# Patient Record
Sex: Female | Born: 1955 | Race: White | Hispanic: No | Marital: Married | Smoking: Never smoker
Health system: Southern US, Community
[De-identification: ages and names within clinical notes are randomized; demographics above are authoritative.]

## PROBLEM LIST (undated history)

## (undated) DIAGNOSIS — Z973 Presence of spectacles and contact lenses: Secondary | ICD-10-CM

## (undated) DIAGNOSIS — T7840XA Allergy, unspecified, initial encounter: Secondary | ICD-10-CM

## (undated) DIAGNOSIS — M7750 Other enthesopathy of unspecified foot: Secondary | ICD-10-CM

## (undated) DIAGNOSIS — R7302 Impaired glucose tolerance (oral): Secondary | ICD-10-CM

## (undated) DIAGNOSIS — Z9889 Other specified postprocedural states: Secondary | ICD-10-CM

## (undated) DIAGNOSIS — R112 Nausea with vomiting, unspecified: Secondary | ICD-10-CM

## (undated) DIAGNOSIS — I517 Cardiomegaly: Secondary | ICD-10-CM

## (undated) DIAGNOSIS — K5900 Constipation, unspecified: Secondary | ICD-10-CM

## (undated) DIAGNOSIS — M76829 Posterior tibial tendinitis, unspecified leg: Secondary | ICD-10-CM

## (undated) DIAGNOSIS — R9431 Abnormal electrocardiogram [ECG] [EKG]: Secondary | ICD-10-CM

## (undated) DIAGNOSIS — M779 Enthesopathy, unspecified: Secondary | ICD-10-CM

## (undated) DIAGNOSIS — J309 Allergic rhinitis, unspecified: Secondary | ICD-10-CM

## (undated) DIAGNOSIS — J45909 Unspecified asthma, uncomplicated: Secondary | ICD-10-CM

## (undated) DIAGNOSIS — K219 Gastro-esophageal reflux disease without esophagitis: Secondary | ICD-10-CM

## (undated) DIAGNOSIS — K769 Liver disease, unspecified: Secondary | ICD-10-CM

## (undated) DIAGNOSIS — R7309 Other abnormal glucose: Secondary | ICD-10-CM

## (undated) DIAGNOSIS — Z923 Personal history of irradiation: Secondary | ICD-10-CM

## (undated) DIAGNOSIS — C801 Malignant (primary) neoplasm, unspecified: Secondary | ICD-10-CM

## (undated) DIAGNOSIS — L708 Other acne: Secondary | ICD-10-CM

## (undated) DIAGNOSIS — K222 Esophageal obstruction: Secondary | ICD-10-CM

## (undated) DIAGNOSIS — K625 Hemorrhage of anus and rectum: Secondary | ICD-10-CM

## (undated) DIAGNOSIS — M216X9 Other acquired deformities of unspecified foot: Secondary | ICD-10-CM

## (undated) DIAGNOSIS — I10 Essential (primary) hypertension: Secondary | ICD-10-CM

## (undated) HISTORY — DX: Abnormal electrocardiogram (ECG) (EKG): R94.31

## (undated) HISTORY — DX: Other abnormal glucose: R73.09

## (undated) HISTORY — DX: Hemorrhage of anus and rectum: K62.5

## (undated) HISTORY — PX: COLONOSCOPY: SHX174

## (undated) HISTORY — DX: Allergy, unspecified, initial encounter: T78.40XA

## (undated) HISTORY — DX: Malignant (primary) neoplasm, unspecified: C80.1

## (undated) HISTORY — DX: Essential (primary) hypertension: I10

## (undated) HISTORY — DX: Liver disease, unspecified: K76.9

## (undated) HISTORY — DX: Posterior tibial tendinitis, unspecified leg: M76.829

## (undated) HISTORY — DX: Allergic rhinitis, unspecified: J30.9

## (undated) HISTORY — PX: UPPER GASTROINTESTINAL ENDOSCOPY: SHX188

## (undated) HISTORY — DX: Other specified postprocedural states: Z98.890

## (undated) HISTORY — PX: OVARIAN CYST REMOVAL: SHX89

## (undated) HISTORY — DX: Esophageal obstruction: K22.2

## (undated) HISTORY — PX: BREAST LUMPECTOMY: SHX2

## (undated) HISTORY — PX: DILATION AND CURETTAGE OF UTERUS: SHX78

## (undated) HISTORY — PX: NASAL SINUS SURGERY: SHX719

## (undated) HISTORY — PX: BREAST SURGERY: SHX581

## (undated) HISTORY — DX: Gastro-esophageal reflux disease without esophagitis: K21.9

## (undated) HISTORY — PX: KNEE ARTHROSCOPY: SUR90

## (undated) HISTORY — DX: Unspecified asthma, uncomplicated: J45.909

## (undated) HISTORY — DX: Impaired glucose tolerance (oral): R73.02

## (undated) HISTORY — DX: Other acquired deformities of unspecified foot: M21.6X9

## (undated) HISTORY — PX: COLONOSCOPY W/ POLYPECTOMY: SHX1380

## (undated) HISTORY — DX: Other acne: L70.8

## (undated) HISTORY — DX: Other enthesopathy of unspecified foot and ankle: M77.50

## (undated) HISTORY — DX: Cardiomegaly: I51.7

## (undated) HISTORY — DX: Constipation, unspecified: K59.00

---

## 1983-09-15 HISTORY — PX: CHOLECYSTECTOMY: SHX55

## 1998-04-01 ENCOUNTER — Encounter: Payer: Self-pay | Admitting: Gastroenterology

## 1998-04-01 HISTORY — PX: ESOPHAGOGASTRODUODENOSCOPY: SHX1529

## 1998-10-15 HISTORY — PX: ARTHROSCOPIC REPAIR ACL: SUR80

## 1998-12-26 ENCOUNTER — Other Ambulatory Visit: Admission: RE | Admit: 1998-12-26 | Discharge: 1998-12-26 | Payer: Self-pay | Admitting: Gynecology

## 2000-02-02 ENCOUNTER — Other Ambulatory Visit: Admission: RE | Admit: 2000-02-02 | Discharge: 2000-02-02 | Payer: Self-pay | Admitting: Gynecology

## 2000-07-24 ENCOUNTER — Encounter: Payer: Self-pay | Admitting: *Deleted

## 2000-07-24 ENCOUNTER — Emergency Department (HOSPITAL_COMMUNITY): Admission: EM | Admit: 2000-07-24 | Discharge: 2000-07-24 | Payer: Self-pay | Admitting: *Deleted

## 2000-09-14 HISTORY — PX: ABDOMINAL HYSTERECTOMY: SHX81

## 2001-02-03 ENCOUNTER — Other Ambulatory Visit: Admission: RE | Admit: 2001-02-03 | Discharge: 2001-02-03 | Payer: Self-pay | Admitting: Gynecology

## 2001-02-21 ENCOUNTER — Inpatient Hospital Stay (HOSPITAL_COMMUNITY): Admission: RE | Admit: 2001-02-21 | Discharge: 2001-02-23 | Payer: Self-pay | Admitting: Gynecology

## 2001-02-21 ENCOUNTER — Encounter (INDEPENDENT_AMBULATORY_CARE_PROVIDER_SITE_OTHER): Payer: Self-pay | Admitting: Specialist

## 2003-12-31 ENCOUNTER — Emergency Department (HOSPITAL_COMMUNITY): Admission: EM | Admit: 2003-12-31 | Discharge: 2003-12-31 | Payer: Self-pay | Admitting: Emergency Medicine

## 2004-09-29 ENCOUNTER — Ambulatory Visit: Payer: Self-pay | Admitting: Endocrinology

## 2004-10-30 ENCOUNTER — Ambulatory Visit: Payer: Self-pay | Admitting: Internal Medicine

## 2005-02-21 ENCOUNTER — Emergency Department (HOSPITAL_COMMUNITY): Admission: EM | Admit: 2005-02-21 | Discharge: 2005-02-21 | Payer: Self-pay | Admitting: *Deleted

## 2005-02-21 ENCOUNTER — Emergency Department (HOSPITAL_COMMUNITY): Admission: EM | Admit: 2005-02-21 | Discharge: 2005-02-21 | Payer: Self-pay | Admitting: Emergency Medicine

## 2005-03-30 ENCOUNTER — Ambulatory Visit: Payer: Self-pay | Admitting: Endocrinology

## 2005-06-18 ENCOUNTER — Other Ambulatory Visit: Admission: RE | Admit: 2005-06-18 | Discharge: 2005-06-18 | Payer: Self-pay | Admitting: Gynecology

## 2006-02-15 ENCOUNTER — Ambulatory Visit: Payer: Self-pay | Admitting: Internal Medicine

## 2006-03-11 ENCOUNTER — Ambulatory Visit: Payer: Self-pay | Admitting: Endocrinology

## 2006-04-01 ENCOUNTER — Ambulatory Visit: Payer: Self-pay | Admitting: Endocrinology

## 2006-04-12 ENCOUNTER — Ambulatory Visit: Payer: Self-pay | Admitting: Endocrinology

## 2006-04-20 ENCOUNTER — Ambulatory Visit: Payer: Self-pay | Admitting: Endocrinology

## 2006-08-07 ENCOUNTER — Ambulatory Visit: Payer: Self-pay | Admitting: Family Medicine

## 2006-11-02 ENCOUNTER — Ambulatory Visit: Payer: Self-pay | Admitting: Internal Medicine

## 2006-12-01 ENCOUNTER — Encounter (HOSPITAL_COMMUNITY): Admission: RE | Admit: 2006-12-01 | Discharge: 2006-12-31 | Payer: Self-pay | Admitting: Orthopedic Surgery

## 2007-04-11 ENCOUNTER — Encounter: Payer: Self-pay | Admitting: Endocrinology

## 2007-11-08 ENCOUNTER — Ambulatory Visit: Payer: Self-pay | Admitting: Sports Medicine

## 2007-11-08 DIAGNOSIS — M216X9 Other acquired deformities of unspecified foot: Secondary | ICD-10-CM | POA: Insufficient documentation

## 2007-11-08 DIAGNOSIS — M7750 Other enthesopathy of unspecified foot: Secondary | ICD-10-CM

## 2007-11-08 DIAGNOSIS — M79609 Pain in unspecified limb: Secondary | ICD-10-CM

## 2007-11-08 HISTORY — DX: Other enthesopathy of unspecified foot and ankle: M77.50

## 2007-11-08 HISTORY — DX: Other acquired deformities of unspecified foot: M21.6X9

## 2007-11-23 ENCOUNTER — Ambulatory Visit: Payer: Self-pay | Admitting: Sports Medicine

## 2007-12-06 ENCOUNTER — Ambulatory Visit: Payer: Self-pay | Admitting: Sports Medicine

## 2007-12-06 ENCOUNTER — Encounter: Payer: Self-pay | Admitting: Family Medicine

## 2007-12-08 ENCOUNTER — Telehealth: Payer: Self-pay | Admitting: Sports Medicine

## 2007-12-22 ENCOUNTER — Encounter: Payer: Self-pay | Admitting: Endocrinology

## 2008-01-04 ENCOUNTER — Ambulatory Visit: Payer: Self-pay | Admitting: Endocrinology

## 2008-01-04 DIAGNOSIS — K625 Hemorrhage of anus and rectum: Secondary | ICD-10-CM

## 2008-01-04 HISTORY — DX: Hemorrhage of anus and rectum: K62.5

## 2008-01-07 LAB — CONVERTED CEMR LAB
ALT: 38 units/L — ABNORMAL HIGH (ref 0–35)
AST: 35 units/L (ref 0–37)
Albumin: 4.1 g/dL (ref 3.5–5.2)
Alkaline Phosphatase: 95 units/L (ref 39–117)
BUN: 8 mg/dL (ref 6–23)
Basophils Absolute: 0.1 10*3/uL (ref 0.0–0.1)
Basophils Relative: 1.3 % — ABNORMAL HIGH (ref 0.0–1.0)
Bilirubin Urine: NEGATIVE
Bilirubin, Direct: 0.1 mg/dL (ref 0.0–0.3)
CO2: 29 meq/L (ref 19–32)
Calcium: 9.5 mg/dL (ref 8.4–10.5)
Chloride: 101 meq/L (ref 96–112)
Cholesterol: 182 mg/dL (ref 0–200)
Creatinine, Ser: 0.5 mg/dL (ref 0.4–1.2)
Crystals: NEGATIVE
Direct LDL: 102.7 mg/dL
Eosinophils Absolute: 0.1 10*3/uL (ref 0.0–0.7)
Eosinophils Relative: 0.9 % (ref 0.0–5.0)
GFR calc Af Amer: 167 mL/min
GFR calc non Af Amer: 138 mL/min
Glucose, Bld: 119 mg/dL — ABNORMAL HIGH (ref 70–99)
HCT: 39.6 % (ref 36.0–46.0)
HDL: 37.2 mg/dL — ABNORMAL LOW (ref >39.0)
Hemoglobin, Urine: NEGATIVE
Hemoglobin: 13.5 g/dL (ref 12.0–15.0)
Ketones, ur: NEGATIVE mg/dL
Leukocytes, UA: NEGATIVE
Lymphocytes Relative: 40.6 % (ref 12.0–46.0)
MCHC: 34.1 g/dL (ref 30.0–36.0)
MCV: 88.6 fL (ref 78.0–100.0)
Monocytes Absolute: 0.6 10*3/uL (ref 0.1–1.0)
Monocytes Relative: 5.8 % (ref 3.0–12.0)
Mucus, UA: NEGATIVE
Neutro Abs: 5 10*3/uL (ref 1.4–7.7)
Neutrophils Relative %: 51.4 % (ref 43.0–77.0)
Nitrite: NEGATIVE
Platelets: 291 10*3/uL (ref 150–400)
Potassium: 3.8 meq/L (ref 3.5–5.1)
RBC / HPF: NONE SEEN
RBC: 4.47 M/uL (ref 3.87–5.11)
RDW: 12.4 % (ref 11.5–14.6)
Sodium: 139 meq/L (ref 135–145)
Specific Gravity, Urine: 1.015 (ref 1.000–1.03)
TSH: 1.51 microintl units/mL (ref 0.35–5.50)
Total Bilirubin: 0.9 mg/dL (ref 0.3–1.2)
Total CHOL/HDL Ratio: 4.9
Total Protein, Urine: NEGATIVE mg/dL
Total Protein: 7.4 g/dL (ref 6.0–8.3)
Triglycerides: 275 mg/dL (ref 0–149)
Urine Glucose: NEGATIVE mg/dL
Urobilinogen, UA: 0.2 (ref 0.0–1.0)
VLDL: 55 mg/dL — ABNORMAL HIGH (ref 0–40)
WBC: 9.7 10*3/uL (ref 4.5–10.5)
pH: 5.5 (ref 5.0–8.0)

## 2008-01-19 ENCOUNTER — Ambulatory Visit: Payer: Self-pay | Admitting: Endocrinology

## 2008-01-19 DIAGNOSIS — K769 Liver disease, unspecified: Secondary | ICD-10-CM | POA: Insufficient documentation

## 2008-01-19 DIAGNOSIS — R739 Hyperglycemia, unspecified: Secondary | ICD-10-CM | POA: Insufficient documentation

## 2008-01-19 DIAGNOSIS — E7800 Pure hypercholesterolemia, unspecified: Secondary | ICD-10-CM

## 2008-01-19 HISTORY — DX: Liver disease, unspecified: K76.9

## 2008-01-25 ENCOUNTER — Ambulatory Visit: Payer: Self-pay | Admitting: Internal Medicine

## 2008-01-25 DIAGNOSIS — J45909 Unspecified asthma, uncomplicated: Secondary | ICD-10-CM

## 2008-01-25 DIAGNOSIS — K219 Gastro-esophageal reflux disease without esophagitis: Secondary | ICD-10-CM

## 2008-01-25 DIAGNOSIS — J309 Allergic rhinitis, unspecified: Secondary | ICD-10-CM

## 2008-01-25 HISTORY — DX: Unspecified asthma, uncomplicated: J45.909

## 2008-01-25 HISTORY — DX: Allergic rhinitis, unspecified: J30.9

## 2008-01-25 HISTORY — DX: Gastro-esophageal reflux disease without esophagitis: K21.9

## 2008-02-17 DIAGNOSIS — K222 Esophageal obstruction: Secondary | ICD-10-CM

## 2008-02-17 DIAGNOSIS — L708 Other acne: Secondary | ICD-10-CM

## 2008-02-17 HISTORY — DX: Esophageal obstruction: K22.2

## 2008-02-17 HISTORY — DX: Other acne: L70.8

## 2008-02-21 ENCOUNTER — Ambulatory Visit: Payer: Self-pay | Admitting: Gastroenterology

## 2008-02-21 DIAGNOSIS — K5900 Constipation, unspecified: Secondary | ICD-10-CM

## 2008-02-21 HISTORY — DX: Constipation, unspecified: K59.00

## 2008-03-05 ENCOUNTER — Encounter: Payer: Self-pay | Admitting: Gastroenterology

## 2008-03-05 ENCOUNTER — Ambulatory Visit: Payer: Self-pay | Admitting: Gastroenterology

## 2008-03-05 LAB — HM COLONOSCOPY

## 2008-03-08 ENCOUNTER — Encounter: Payer: Self-pay | Admitting: Gastroenterology

## 2008-08-30 ENCOUNTER — Encounter: Payer: Self-pay | Admitting: Endocrinology

## 2008-09-05 ENCOUNTER — Ambulatory Visit: Payer: Self-pay | Admitting: Internal Medicine

## 2008-09-05 DIAGNOSIS — H01009 Unspecified blepharitis unspecified eye, unspecified eyelid: Secondary | ICD-10-CM | POA: Insufficient documentation

## 2008-10-22 ENCOUNTER — Encounter: Payer: Self-pay | Admitting: Endocrinology

## 2009-07-08 ENCOUNTER — Encounter: Payer: Self-pay | Admitting: Endocrinology

## 2009-09-14 HISTORY — PX: US ECHOCARDIOGRAPHY: HXRAD669

## 2009-09-14 HISTORY — PX: CARDIOVASCULAR STRESS TEST: SHX262

## 2010-01-23 ENCOUNTER — Encounter: Payer: Self-pay | Admitting: Endocrinology

## 2010-03-08 LAB — CONVERTED CEMR LAB: Pap Smear: NORMAL

## 2010-04-07 ENCOUNTER — Encounter: Payer: Self-pay | Admitting: Sports Medicine

## 2010-05-13 ENCOUNTER — Ambulatory Visit: Payer: Self-pay | Admitting: Sports Medicine

## 2010-05-13 DIAGNOSIS — S93409A Sprain of unspecified ligament of unspecified ankle, initial encounter: Secondary | ICD-10-CM | POA: Insufficient documentation

## 2010-05-13 DIAGNOSIS — M76829 Posterior tibial tendinitis, unspecified leg: Secondary | ICD-10-CM

## 2010-05-13 HISTORY — DX: Posterior tibial tendinitis, unspecified leg: M76.829

## 2010-08-05 ENCOUNTER — Encounter: Payer: Self-pay | Admitting: Endocrinology

## 2010-08-05 ENCOUNTER — Ambulatory Visit: Payer: Self-pay | Admitting: Cardiology

## 2010-08-05 ENCOUNTER — Ambulatory Visit: Payer: Self-pay | Admitting: Endocrinology

## 2010-08-05 ENCOUNTER — Ambulatory Visit (HOSPITAL_COMMUNITY): Admission: RE | Admit: 2010-08-05 | Discharge: 2010-08-05 | Payer: Self-pay | Admitting: Endocrinology

## 2010-08-05 ENCOUNTER — Ambulatory Visit: Payer: Self-pay

## 2010-08-05 ENCOUNTER — Encounter: Payer: Self-pay | Admitting: Cardiology

## 2010-08-05 DIAGNOSIS — R42 Dizziness and giddiness: Secondary | ICD-10-CM

## 2010-08-05 DIAGNOSIS — R9431 Abnormal electrocardiogram [ECG] [EKG]: Secondary | ICD-10-CM

## 2010-08-05 DIAGNOSIS — N390 Urinary tract infection, site not specified: Secondary | ICD-10-CM | POA: Insufficient documentation

## 2010-08-05 DIAGNOSIS — I10 Essential (primary) hypertension: Secondary | ICD-10-CM

## 2010-08-05 HISTORY — DX: Essential (primary) hypertension: I10

## 2010-08-05 HISTORY — DX: Abnormal electrocardiogram (ECG) (EKG): R94.31

## 2010-08-06 ENCOUNTER — Encounter: Payer: Self-pay | Admitting: Endocrinology

## 2010-08-06 ENCOUNTER — Telehealth: Payer: Self-pay | Admitting: Endocrinology

## 2010-08-15 ENCOUNTER — Encounter: Payer: Self-pay | Admitting: Endocrinology

## 2010-08-18 ENCOUNTER — Encounter (HOSPITAL_COMMUNITY): Admission: RE | Admit: 2010-08-18 | Payer: Self-pay | Admitting: Endocrinology

## 2010-08-18 ENCOUNTER — Encounter: Payer: Self-pay | Admitting: Cardiology

## 2010-08-18 ENCOUNTER — Telehealth: Payer: Self-pay | Admitting: Endocrinology

## 2010-09-01 ENCOUNTER — Ambulatory Visit: Payer: Self-pay | Admitting: Endocrinology

## 2010-09-03 ENCOUNTER — Telehealth (INDEPENDENT_AMBULATORY_CARE_PROVIDER_SITE_OTHER): Payer: Self-pay | Admitting: Radiology

## 2010-09-04 ENCOUNTER — Encounter (HOSPITAL_COMMUNITY)
Admission: RE | Admit: 2010-09-04 | Discharge: 2010-10-14 | Payer: Self-pay | Source: Home / Self Care | Attending: Endocrinology | Admitting: Endocrinology

## 2010-09-04 ENCOUNTER — Encounter: Payer: Self-pay | Admitting: Cardiology

## 2010-09-04 ENCOUNTER — Ambulatory Visit: Payer: Self-pay

## 2010-09-09 ENCOUNTER — Encounter: Payer: Self-pay | Admitting: Endocrinology

## 2010-09-23 ENCOUNTER — Ambulatory Visit
Admission: RE | Admit: 2010-09-23 | Discharge: 2010-09-23 | Payer: Self-pay | Source: Home / Self Care | Attending: Endocrinology | Admitting: Endocrinology

## 2010-09-23 DIAGNOSIS — I517 Cardiomegaly: Secondary | ICD-10-CM | POA: Insufficient documentation

## 2010-09-23 HISTORY — DX: Cardiomegaly: I51.7

## 2010-10-12 LAB — CONVERTED CEMR LAB
ALT: 24 units/L (ref 0–35)
ALT: 27 units/L (ref 0–35)
AST: 23 units/L (ref 0–37)
AST: 25 units/L (ref 0–37)
Albumin: 4 g/dL (ref 3.5–5.2)
Albumin: 4.6 g/dL (ref 3.5–5.2)
Alkaline Phosphatase: 111 units/L (ref 39–117)
Alkaline Phosphatase: 94 units/L (ref 39–117)
BUN: 13 mg/dL (ref 6–23)
Basophils Absolute: 0 10*3/uL (ref 0.0–0.1)
Basophils Relative: 0.4 % (ref 0.0–3.0)
Bilirubin Urine: NEGATIVE
Bilirubin, Direct: 0.1 mg/dL (ref 0.0–0.3)
Bilirubin, Direct: 0.1 mg/dL (ref 0.0–0.3)
CO2: 30 meq/L (ref 19–32)
Calcium: 9.5 mg/dL (ref 8.4–10.5)
Chloride: 105 meq/L (ref 96–112)
Cholesterol: 198 mg/dL (ref 0–200)
Creatinine, Ser: 0.5 mg/dL (ref 0.4–1.2)
Eosinophils Absolute: 0 10*3/uL (ref 0.0–0.7)
Eosinophils Relative: 0.1 % (ref 0.0–5.0)
GFR calc non Af Amer: 140.02 mL/min (ref >60)
Glucose, Bld: 114 mg/dL — ABNORMAL HIGH (ref 70–99)
HCT: 42.3 % (ref 36.0–46.0)
HDL: 45.7 mg/dL (ref >39.00)
Hemoglobin, Urine: NEGATIVE
Hemoglobin: 14.7 g/dL (ref 12.0–15.0)
Hgb A1c MFr Bld: 5.5 % (ref 4.6–6.0)
Hgb A1c MFr Bld: 5.8 % (ref 4.6–6.5)
Ketones, ur: NEGATIVE mg/dL
LDL Cholesterol: 115 mg/dL — ABNORMAL HIGH (ref 0–99)
Leukocytes, UA: NEGATIVE
Lymphocytes Relative: 34.4 % (ref 12.0–46.0)
Lymphs Abs: 2.8 10*3/uL (ref 0.7–4.0)
MCHC: 34.7 g/dL (ref 30.0–36.0)
MCV: 89.4 fL (ref 78.0–100.0)
Monocytes Absolute: 0.3 10*3/uL (ref 0.1–1.0)
Monocytes Relative: 3.9 % (ref 3.0–12.0)
Neutro Abs: 5.1 10*3/uL (ref 1.4–7.7)
Neutrophils Relative %: 61.2 % (ref 43.0–77.0)
Nitrite: NEGATIVE
Platelets: 318 10*3/uL (ref 150.0–400.0)
Potassium: 4.2 meq/L (ref 3.5–5.1)
RBC: 4.73 M/uL (ref 3.87–5.11)
RDW: 13.5 % (ref 11.5–14.6)
Sodium: 142 meq/L (ref 135–145)
Specific Gravity, Urine: 1.02 (ref 1.000–1.030)
TSH: 1.58 microintl units/mL (ref 0.35–5.50)
Total Bilirubin: 0.5 mg/dL (ref 0.3–1.2)
Total Bilirubin: 0.6 mg/dL (ref 0.3–1.2)
Total CHOL/HDL Ratio: 4
Total Protein, Urine: NEGATIVE mg/dL
Total Protein: 7.3 g/dL (ref 6.0–8.3)
Total Protein: 7.8 g/dL (ref 6.0–8.3)
Triglycerides: 185 mg/dL — ABNORMAL HIGH (ref 0.0–149.0)
Urine Glucose: NEGATIVE mg/dL
Urobilinogen, UA: 0.2 (ref 0.0–1.0)
VLDL: 37 mg/dL (ref 0.0–40.0)
WBC: 8.3 10*3/uL (ref 4.5–10.5)
pH: 7 (ref 5.0–8.0)

## 2010-10-14 NOTE — Letter (Signed)
Summary: [facility]  [facility]   Imported By: Sherian Rein 02/19/2010 12:05:12  _____________________________________________________________________  External Attachment:    Type:   Image     Comment:   External Document

## 2010-10-14 NOTE — Consult Note (Signed)
Summary: [facility]  [facility]   Imported By: Marily Memos 05/09/2010 11:03:41  _____________________________________________________________________  External Attachment:    Type:   Image     Comment:   External Document

## 2010-10-14 NOTE — Letter (Signed)
Summary: Out of Work  [facility]  [address] [place], [place] [address]   Phone: [phone]  Fax: [phone]    August 05, 2010   Employee:  Laura [name]    To Whom It May Concern:   For Medical reasons, please excuse the above named employee from work for the following dates:  Start:   08/05/10  End:   08/11/10     Sincerely,    Minus Breeding MD

## 2010-10-14 NOTE — Assessment & Plan Note (Signed)
Summary: dizzy/sinus inf?cd   Vital Signs:  Patient profile:   55 year old female Height:      67 inches (170.18 cm) Weight:      169.75 pounds (77.16 kg) BMI:     26.68 O2 Sat:      98 % on Room air Temp:     98.0 degrees F (36.67 degrees C) oral Pulse rate:   82 / minute BP sitting:   162 / 92  (left arm) Cuff size:   regular  Vitals Entered By: Brenton Grills CMA Duncan Dull) (August 05, 2010 10:34 AM)  O2 Flow:  Room air CC: dizziness since last night, Elevate BP/blood work/aj Is Patient Diabetic? No Comments pt is no longer taking Promethazine-codeine cough syrup, Eyrthromycin, or Cephalexin   Primary Shaylea Ucci:  Nelwyn Salisbury ,MD  CC:  dizziness since last night and Elevate BP/blood work/aj.  History of Present Illness: pt states 1 day of slight congestion in the nose, and associated non-vertigenous-quality dizziness.  pt says she last had vertigo a few years ago.    Current Medications (verified): 1)  Multivitamins   Tabs (Multiple Vitamin) .... Take 1 By Mouth Qd 2)  Caltrate 600+d 600-400 Mg-Unit  Tabs (Calcium Carbonate-Vitamin D) .... Take 2 By Mouth Qd 3)  Prevacid 30 Mg  Cpdr (Lansoprazole) .... Take 1 By Mouth Qd 4)  Advair Diskus 100-50 Mcg/dose  Misc (Fluticasone-Salmeterol) .... Tale 1 Puff Once Daily Prn 5)  Promethazine-Codeine 6.25-10 Mg/48ml  Syrp (Promethazine-Codeine) .Marland Kitchen.. 1 Tsp By Mouth Qid Prn 6)  Aspirin 81 Mg  Tbec (Aspirin) .... Once Daily 7)  Vitamin E   Crea (Vitamin E) .... Once Daily 8)  Miralax   Powd (Polyethylene Glycol 3350) .... Take One Scoop At Bedtime 9)  Erythromycin 5 Mg/gm Oint (Erythromycin) .... Use Asd Four Times Per Day For 10 Days 10)  Cephalexin 500 Mg Caps (Cephalexin) .Marland Kitchen.. 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         denies earache  Physical Exam  General:  normal appearance.   Head:  head: no deformity eyes: no periorbital swelling, no proptosis external nose and ears are normal mouth: no lesion seen Ears:  both tm's are congested.  the right tm also has slight erythema Neck:  Supple without thyroid enlargement or tenderness.  Lungs:  Clear to auscultation bilaterally. Normal respiratory effort.  Heart:  Regular rate and rhythm without murmurs or gallops noted. Normal S1,S2.   Msk:  muscle bulk and strength are grossly normal.  no obvious joint swelling.  gait is normal and steady    Impression & Recommendations:  Problem # 1:  DIZZINESS (ICD-780.4) uncertain etiology  Problem # 2:  uri new  Problem # 3:  ABNORMAL ELECTROCARDIOGRAM (ICD-794.31) Assessment: New  new since 2009 i have discussed with cardiology  Orders: Echo Referral (Echo) Echocardiogram (Echo) Nuclear Stress Test (Nuc Stress Test) Cardiolite (Cardiolite)  Medications Added to Medication List This Visit: 1)  Meclizine Hcl 12.5 Mg Tabs (Meclizine hcl) .Marland Kitchen.. 1-2 tabs three times a day  as needed for dizziness 2)  Azithromycin 500 Mg Tabs (Azithromycin) .Marland Kitchen.. 1 tab once daily  Other Orders: EKG w/ Interpretation (93000) TLB-BMP (Basic Metabolic Panel-BMET) (80048-METABOL) TLB-Lipid Panel (80061-LIPID) TLB-CBC Platelet - w/Differential (85025-CBCD) TLB-Hepatic/Liver Function Pnl (80076-HEPATIC) TLB-TSH (Thyroid Stimulating Hormone) (84443-TSH) TLB-A1C / Hgb A1C  (Glycohemoglobin) (83036-A1C) TLB-Udip w/ Micro (81001-URINE) Est. Patient Level IV (78295)  Patient Instructions: 1)  blood tests are being ordered for you today.  please call [phone] to hear your test results. 2)  pending the test results, please take: 3)  loratadine-d (non-prescription) as needed for congestion 4)  azithromycin 500 mg once daily 5)  meclizine three times a day as needed for dizziness 6)  check "echo," and treadmill "myoview."  both are special but easy heart tests, to see if you have a heart problem that would explaiin your abnormal cardiogram and/or your symptoms.  you will be called with a day and time for an appointment. 7)  you should take it easy until you feel better. Prescriptions: AZITHROMYCIN 500 MG TABS (AZITHROMYCIN) 1 tab once daily  #6 x 0   Entered and Authorized by:   Minus Breeding MD   Signed by:   Minus Breeding MD on 08/05/2010   Method used:   Electronically to        [facility] (retail)       [address]       [place], [place]  [address]       Ph: [phone]       Fax: [phone]   RxID:   [id number] MECLIZINE HCL 12.5 MG TABS (MECLIZINE HCL) 1-2 tabs three times a day as needed for dizziness  #30 x 1   Entered and Authorized by:   Minus Breeding MD   Signed by:   Minus Breeding MD on 08/05/2010   Method used:   Electronically to        [facility] (retail)       [address]       [place], [place]  [address]       Ph: [phone]       Fax: [phone]   RxID:   [id number]    Orders Added: 1)  EKG w/ Interpretation [93000] 2)  TLB-BMP (Basic Metabolic Panel-BMET) [80048-METABOL] 3)  TLB-Lipid Panel [80061-LIPID] 4)  TLB-CBC Platelet - w/Differential [85025-CBCD] 5)  TLB-Hepatic/Liver Function Pnl [80076-HEPATIC] 6)  TLB-TSH (Thyroid Stimulating Hormone) [84443-TSH] 7)  TLB-A1C / Hgb A1C (Glycohemoglobin) [83036-A1C] 8)  TLB-Udip w/ Micro  [81001-URINE] 9)  Est. Patient Level IV [70623] 10)  Echo Referral [Echo] 11)  Echocardiogram [Echo] 12)  Nuclear Stress Test [Nuc Stress Test] 13)  Cardiolite [Cardiolite]

## 2010-10-14 NOTE — Miscellaneous (Signed)
Summary: Appointment Canceled  Appointment status changed to canceled by [employer] on 08/18/2010 10:38 AM.  Cancellation Comments --------------------- crs/ wt: 169/ dx: abn ekg. dr. Everardo All. [email or web address] precret gd  Appointment Information ----------------------- Appt Type:  CARDIOLOGY NUCLEAR TESTING      Date:  Monday, August 18, 2010      Time:  9:45 AM for 15 min   Urgency:  Routine   Made By:  Hoy Finlay Scheduler  To Visit:  [id number]    Reason:  crs/ wt: 169/ dx: abn ekg. dr. Everardo All. [email or web address] precret gd  Appt Comments ------------- -- 08/18/10 10:38: ([facility]) CANCELED -- crs/ wt: 169/ dx: abn ekg. dr. Everardo All. [email or web address] precret gd -- 08/05/10 11:57: ([facility]) BOOKED -- Routine CARDIOLOGY NUCLEAR TESTING at 08/18/2010 9:45 AM for 15 min crs/ wt: 169/ dx: abn ekg. dr. Everardo All. [email or web address] pre

## 2010-10-14 NOTE — Progress Notes (Signed)
Summary: Echo results  Phone Note Call from Laura [name] Call back at Home Phone [phone]   Caller: Laura [name] [phone] cell Summary of Call: Pt called requesting results of ECHO. Pt advised that results not reviewed by SAE but I will request results on his return Monday 11/28. Pt okay to wait. Initial call taken by: Margaret Pyle, CMA,  August 06, 2010 2:25 PM  Follow-up for Phone Call        i left message on phone tree  Follow-up by: Minus Breeding MD,  August 10, 2010 5:21 PM  Additional Follow-up for Phone Call Additional follow up Details #1::        left message on machine for pt to return my call. Margaret Pyle, CMA  August 12, 2010 11:03 AM  Pt advised via Harriett Sine to check PT message Additional Follow-up by: Margaret Pyle, CMA,  August 12, 2010 4:08 PM    New/Updated Medications: LOSARTAN POTASSIUM 25 MG TABS (LOSARTAN POTASSIUM) 1 tab once daily CIPROFLOXACIN HCL 500 MG TABS (CIPROFLOXACIN HCL) 1 tab two times a day Prescriptions: CIPROFLOXACIN HCL 500 MG TABS (CIPROFLOXACIN HCL) 1 tab two times a day  #14 x 0   Entered and Authorized by:   Minus Breeding MD   Signed by:   Minus Breeding MD on 08/10/2010   Method used:   Electronically to        [facility] (retail)       [address]       [place], [place]  [address]       Ph: [phone]       Fax: [phone]   RxID:   [id number] LOSARTAN POTASSIUM 25 MG TABS (LOSARTAN POTASSIUM) 1 tab once daily  #30 x 11   Entered and Authorized by:   Minus Breeding MD   Signed by:   Minus Breeding MD on 08/10/2010   Method used:   Electronically to        [facility] (retail)       [address]       [place], [place]  [address]       Ph: [phone]       Fax: [phone]   RxID:   [id number]

## 2010-10-14 NOTE — Progress Notes (Signed)
Summary: Missed OV  Phone Note Call from Patient   Caller: Patient [phone] Summary of Call: Pt called very upset stating she missed her appt for stress test today because "someone" wrote down her appt as 12/03 instead of 12/05. Pt says she when Saturday and they were closed but she was called this morning but Cardiology to find out if she was still coming for her appt. Pt said she has to cancel appt and re-schedule to 12/22 because she cannot take anymore time off of work. Pt was adamant that SAE be informed that she missed her appt "because your office messes up". Is pt okay to have stress test done 12/22? Initial call taken by: Margaret Pyle, CMA,  August 18, 2010 4:19 PM  Follow-up for Phone Call        yes, it is ok Follow-up by: Minus Breeding MD,  August 18, 2010 5:20 PM  Additional Follow-up for Phone Call Additional follow up Details #1::        left message for pt to callback office Additional Follow-up by: Brenton Grills CMA Duncan Dull),  August 19, 2010 9:35 AM    Additional Follow-up for Phone Call Additional follow up Details #2::    pt informed Follow-up by: Brenton Grills CMA Duncan Dull),  August 19, 2010 10:11 AM

## 2010-10-14 NOTE — Assessment & Plan Note (Signed)
Summary: ORTHOTICS PER [facility]   Vital Signs:  Patient profile:   55 year old female Height:      67 inches Weight:      165 pounds BMI:     25.94 BP sitting:   159 / 98  Vitals Entered By: Lillia Pauls CMA (May 13, 2010 3:02 PM)  Primary Provider:  Nelwyn Salisbury ,MD   History of Present Illness: 55yo female to office referred by Dr. Thurston Hole for orthotics. Pt sustained L ankle sprain 03/08/10, she has been doing physical therapy & using ASO brace, but started to develop increasing medial ankle pain radiating down to foot.  During f/u eval 04/28/10 pt noted to have posterior tibialis tendonitis & mild collapse of transverse arch. She has intermittant swelling over the medial aspect of her ankle. She still feels weak in the ankle, but is improving. Pt is on her feet for long periods of time for her job.  Allergies: No Known Drug Allergies  Physical Exam  General:  Well-developed,well-nourished,in no acute distress; alert,appropriate and cooperative throughout examination Msk:  ANKLES:  - L Ankle: mild swelling noted posterior to medial malleoli, no ecchymosis or erythema.  No warmth. Full ROM. Strength +4/5 eversion, plantar flexion, inversion; +5/5 dorsiflexion Stable lateral & medial ligaments. Talar dome nontender; No sign of peroneal tendon subluxations; (+)TTP along posterior tibialis with tenderness over insertion at navicular.  FEET: decreased calcaneal varus on left with b/l heel raise.  Mild collapse of longitudinal arch of left foot.  Right foot with stable arch.  GAIT: walks with b/l pronation, noted to have some wobble of left foot/ankle.     Impression & Recommendations:  Problem # 1:  FOOT PAIN, LEFT (ICD-729.5) - L foot pain secondary to posterior tibialis tendonitis related to ankle sprain. - Pt would benefit from custom orthotics to stabilize arch & provide more support. - Patient was fitted for a : standard, cushioned, semi-rigid orthotic. The  orthotic was heated and afterward the patient stood on the orthotic blank positioned on the orthotic stand. The patient was positioned in subtalar neutral position and 10 degrees of ankle dorsiflexion in a weight bearing stance. After completion of molding, a stable base was applied to the orthotic blank. The blank was ground to a stable position for weight bearing. Size: 8 Base: blue EVA foam Posting: none Additional orthotic padding: none Evaluation of gait with orthotics reveals more neutral position.  Pt feels these are comfortable. - Contact office with any questions/concerns regarding orthotics.  Orders: [employer], each unit [phone]) Foot Orthosis ( Arch Strap/Heel Cup) [phone])  Problem # 2:  TIBIALIS TENDINITIS (ICD-726.72) - Left posterior tibialis tendinitis - Fitted with custom orthotics - Provided with arch straps for additional support/comfort - Reviewed home exercises.  Problem # 3:  ANKLE SPRAIN, LEFT (ICD-845.00) - Cont. ASO & PT as prescribed. - f/u with Dr. Thurston Hole as directed.  Her updated medication list for this problem includes:    Aspirin 81 Mg Tbec (Aspirin) ..... Once daily  Complete Medication List: 1)  Multivitamins Tabs (Multiple vitamin) .... Take 1 by mouth qd 2)  Caltrate 600+d 600-400 Mg-unit Tabs (Calcium carbonate-vitamin d) .... Take 2 by mouth qd 3)  Prevacid 30 Mg Cpdr (Lansoprazole) .... Take 1 by mouth qd 4)  Advair Diskus 100-50 Mcg/dose Misc (Fluticasone-salmeterol) .... Tale 1 puff once daily prn 5)  Promethazine-codeine 6.25-10 Mg/31ml Syrp (Promethazine-codeine) .Marland Kitchen.. 1 tsp by mouth qid prn 6)  Aspirin 81 Mg Tbec (Aspirin) .... Once daily 7)  Vitamin E Crea (Vitamin e) .... Once daily 8)  Miralax Powd (Polyethylene glycol 3350) .... Take one scoop at bedtime 9)  Erythromycin 5 Mg/gm Oint (Erythromycin) .... Use asd four times per day for 10 days 10)  Cephalexin 500 Mg Caps (Cephalexin) .Marland Kitchen.. 1po three times a day for 10 days

## 2010-10-16 NOTE — Progress Notes (Signed)
Summary: nuc pre-procedure  Phone Note Outgoing Call   Call placed by: Domenic Polite, CNMT,  September 03, 2010 3:57 PM Call placed to: Patient Reason for Call: Confirm/change Appt Summary of Call: Reviewed information on Myoview Information Sheet (see scanned document for further details).  Spoke with patient.      Nuclear Med Background Indications for Stress Test: Evaluation for Ischemia, Abnormal EKG   History: Asthma, Echo  History Comments: 08/05/10 Echo mild LVH     Nuclear Pre-Procedure Cardiac Risk Factors: Lipids Height (in): 67

## 2010-10-16 NOTE — Assessment & Plan Note (Signed)
Summary: Cardiology Nuclear Testing  Nuclear Med Background Indications for Stress Test: Evaluation for Ischemia, Abnormal EKG   History: Asthma, Echo  History Comments: 08/05/10 Echo:mild LVH   Symptoms Comments: No cardiac complaints   Nuclear Pre-Procedure Cardiac Risk Factors: Hypertension, Lipids Caffeine/Decaff Intake: None NPO After: 7:30 PM Lungs: Clear.  O2 Sat 99% on RA. IV 0.9% NS with Angio Cath: 22g     IV Site: R Antecubital IV Started by: Bonnita Levan, RN Chest Size (in) 38     Cup Size D     Height (in): 67 Weight (lb): 175 BMI: 27.51 Tech Comments: No medications taken this a.m.  Nuclear Med Study 1 or 2 day study:  1 day     Stress Test Type:  Treadmill/Lexiscan Reading MD:  Olga Millers, MD     Referring MD:  Romero Belling, MD Resting Radionuclide:  Technetium 48m Tetrofosmin     Resting Radionuclide Dose:  11 mCi  Stress Radionuclide:  Technetium 69m Tetrofosmin     Stress Radionuclide Dose:  33 mCi   Stress Protocol Exercise Time (min):  2:00 min     Max HR:  144 bpm     Predicted Max HR:  167 bpm  Max Systolic BP: 179 mm Hg     Percent Max HR:  86.23 %Rate Pressure Product:  16109  Lexiscan: 0.4 mg   Stress Test Technologist:  Rea College, CMA-N     Nuclear Technologist:  Doyne Keel, CNMT  Rest Procedure  Myocardial perfusion imaging was performed at rest 45 minutes following the intravenous administration of Technetium 21m Tetrofosmin.  Stress Procedure  The patient received IV Lexiscan 0.4 mg over 15-seconds with concurrent low level exercise and then Technetium 83m Tetrofosmin was injected at 30-seconds.  There were no significant changes with Lexiscan.  Quantitative spect images were obtained after a 45 minute delay.  QPS Raw Data Images:  There is no interference from other nuclear activity. Stress Images:  There is decreased uptake in the distal anterior wall and apex. Rest Images:  There is decreased uptake in the distal anterior wall  and apex. Subtraction (SDS):  No evidence of ischemia. Transient Ischemic Dilatation:  .94  (Normal <1.22)  Lung/Heart Ratio:  .29  (Normal <0.45)  Quantitative Gated Spect Images QGS EDV:  85 ml QGS ESV:  28 ml QGS EF:  67 % QGS cine images:  Normal wall motion.   Overall Impression  Exercise Capacity: Lexiscan with no exercise. BP Response: Normal blood pressure response. Clinical Symptoms: No chest pain ECG Impression: Insignificant upsloping ST segment depression. Overall Impression: Normal lexiscan nuclear study with soft tissue attenuation but no ischemia.  Appended Document: Cardiology Nuclear Testing please leave message on phone tree--normal  Appended Document: Cardiology Nuclear Testing left message on phone tree/AJ

## 2010-10-16 NOTE — Assessment & Plan Note (Signed)
Summary: FU---STC   Vital Signs:  Patient profile:   55 year old female Height:      67 inches (170.18 cm) Weight:      177.25 pounds (80.57 kg) BMI:     27.86 O2 Sat:      98 % on Room air Temp:     98.3 degrees F (36.83 degrees C) oral Pulse rate:   77 / minute BP sitting:   124 / 74  (left arm) Cuff size:   regular  Vitals Entered By: Brenton Grills CMA Duncan Dull) (September 23, 2010 4:31 PM)  O2 Flow:  Room air CC: Follow-up visit/aj Is Patient Diabetic? No   Primary Provider:  Nelwyn Salisbury ,MD  CC:  Follow-up visit/aj.  History of Present Illness: pt says she takes losartan as rx'ed.  dizziness is much better.    Current Medications (verified): 1)  Multivitamins   Tabs (Multiple Vitamin) .... Take 1 By Mouth Qd 2)  Caltrate 600+d 600-400 Mg-Unit  Tabs (Calcium Carbonate-Vitamin D) .... Take 2 By Mouth Qd 3)  Prevacid 30 Mg  Cpdr (Lansoprazole) .... Take 1 By Mouth Qd 4)  Advair Diskus 100-50 Mcg/dose  Misc (Fluticasone-Salmeterol) .... Tale 1 Puff Once Daily Prn 5)  Aspirin 81 Mg  Tbec (Aspirin) .... Once Daily 6)  Vitamin E   Crea (Vitamin E) .... Once Daily 7)  Miralax   Powd (Polyethylene Glycol 3350) .... Take One Scoop At Bedtime 8)  Meclizine Hcl 12.5 Mg Tabs (Meclizine Hcl) .Marland Kitchen.. 1-2 Tabs Three Times A Day As Needed For Dizziness 9)  Azithromycin 500 Mg Tabs (Azithromycin) .Marland Kitchen.. 1 Tab Once Daily 10)  Losartan Potassium 25 Mg Tabs (Losartan Potassium) .Marland Kitchen.. 1 Tab Once Daily 11)  Ciprofloxacin Hcl 500 Mg Tabs (Ciprofloxacin Hcl) .Marland Kitchen.. 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    Physical Exam  General:  normal appearance.   Lungs:  Clear to auscultation bilaterally. Normal respiratory effort.  Heart:  Regular rate and rhythm without murmurs or gallops noted. Normal S1,S2.     Impression & Recommendations:  Problem # 1:  HYPERTENSION (ICD-401.9) well-controlled  Other Orders: Est. Patient Level III (04540)  Patient Instructions: 1)  continue losartan. 2)  Please schedule a follow-up appointment in 1 year.   Orders Added: 1)  Est. Patient Level III [98119]     Preventive Care Screening  Mammogram:    Date:  03/08/2010    Results:  normal   Pap Smear:    Date:  03/08/2010    Results:  normal      gyn is dr Greta Doom, who does dexa and mammography

## 2010-10-16 NOTE — Letter (Signed)
Summary: [facility]  [facility]   Imported By: Lester [place] 10/01/2010 07:52:50  _____________________________________________________________________  External Attachment:    Type:   Image     Comment:   External Document

## 2010-10-16 NOTE — Letter (Signed)
Summary: [facility]  [facility]   Imported By: Lester [place] 09/24/2010 12:23:08  _____________________________________________________________________  External Attachment:    Type:   Image     Comment:   External Document

## 2010-11-06 ENCOUNTER — Ambulatory Visit (INDEPENDENT_AMBULATORY_CARE_PROVIDER_SITE_OTHER): Payer: BC Managed Care – PPO | Admitting: Endocrinology

## 2010-11-06 ENCOUNTER — Encounter: Payer: Self-pay | Admitting: Endocrinology

## 2010-11-06 DIAGNOSIS — J069 Acute upper respiratory infection, unspecified: Secondary | ICD-10-CM

## 2010-11-11 NOTE — Assessment & Plan Note (Signed)
Summary: sinus infection /nws   Vital Signs:  Patient profile:   55 year old female Height:      67 inches (170.18 cm) Weight:      181.25 pounds (82.39 kg) BMI:     28.49 O2 Sat:      98 % on Room air Temp:     98.1 degrees F (36.72 degrees C) oral Pulse rate:   75 / minute Pulse rhythm:   regular BP sitting:   122 / 78  (left arm) Cuff size:   regular  Vitals Entered By: Brenton Grills CMA Duncan Dull) (November 06, 2010 4:22 PM)  O2 Flow:  Room air CC: Sinus infection (greenish yellow mucus) x 4 days/aj Is Patient Diabetic? No   Primary Provider:  Nelwyn Salisbury ,MD  CC:  Sinus infection (greenish yellow mucus) x 4 days/aj.  History of Present Illness: 3 days of nasal congestion, earache, purulent nasal drainage, and slight cough.    Current Medications (verified): 1)  Multivitamins   Tabs (Multiple Vitamin) .... Take 1 By Mouth Qd 2)  Caltrate 600+d 600-400 Mg-Unit  Tabs (Calcium Carbonate-Vitamin D) .... Take 2 By Mouth Qd 3)  Prevacid 30 Mg  Cpdr (Lansoprazole) .... Take 1 By Mouth Qd 4)  Advair Diskus 100-50 Mcg/dose  Misc (Fluticasone-Salmeterol) .... Tale 1 Puff Once Daily Prn 5)  Aspirin 81 Mg  Tbec (Aspirin) .... Once Daily 6)  Vitamin E   Crea (Vitamin E) .... Once Daily 7)  Miralax   Powd (Polyethylene Glycol 3350) .... Take One Scoop At Bedtime 8)  Losartan Potassium 25 Mg Tabs (Losartan Potassium) .Marland Kitchen.. 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    Physical Exam  General:  normal appearance.   Head:  head: no deformity eyes: no periorbital swelling, no proptosis external nose and ears are normal mouth: no lesion seen Lungs:  Clear to auscultation bilaterally. Normal respiratory effort.    Impression & Recommendations:  Problem # 1:  URI (ICD-465.9) Assessment New  Medications Added to Medication List This Visit: 1)  Promethazine-codeine 6.25-10 Mg/52ml Syrp (Promethazine-codeine) .Marland Kitchen.. 1 teaspoon every 4 hrs as needed for cough 2)  Azithromycin 500 Mg Tabs (Azithromycin) .Marland Kitchen.. 1 tab once daily  Other Orders: Est. Patient Level III (16109)  Patient Instructions: 1)  take azithromycin 500 mg once daily 2)  take loratadine-d (non-prescription) as needed for congestion 3)  here is a prescription for cough syrup.   Prescriptions: AZITHROMYCIN 500 MG TABS (AZITHROMYCIN) 1 tab once daily  #6 x 0   Entered and Authorized by:   Minus Breeding MD   Signed by:   Minus Breeding MD on 11/06/2010   Method used:   Print then Give to Patient   RxID:   [id number] PROMETHAZINE-CODEINE 6.25-10 MG/5ML SYRP (PROMETHAZINE-CODEINE) 1 teaspoon every 4 hrs as needed for cough  #8 oz x 0   Entered and Authorized by:   Minus Breeding MD   Signed by:   Minus Breeding MD on 11/06/2010   Method used:  Print then Give to Patient   RxID:   [id number]    Orders Added: 1)  Est. Patient Level III [14782]

## 2010-12-23 ENCOUNTER — Emergency Department (HOSPITAL_COMMUNITY)
Admission: EM | Admit: 2010-12-23 | Discharge: 2010-12-23 | Disposition: A | Discharge status: Home / Self Care | Payer: BC Managed Care – PPO | Attending: Emergency Medicine | Admitting: Emergency Medicine

## 2010-12-23 ENCOUNTER — Emergency Department (HOSPITAL_COMMUNITY): Payer: BC Managed Care – PPO

## 2010-12-23 DIAGNOSIS — S2220XA Unspecified fracture of sternum, initial encounter for closed fracture: Secondary | ICD-10-CM | POA: Insufficient documentation

## 2010-12-23 DIAGNOSIS — S20219A Contusion of unspecified front wall of thorax, initial encounter: Secondary | ICD-10-CM | POA: Insufficient documentation

## 2010-12-23 DIAGNOSIS — I10 Essential (primary) hypertension: Secondary | ICD-10-CM | POA: Insufficient documentation

## 2010-12-23 DIAGNOSIS — Z79899 Other long term (current) drug therapy: Secondary | ICD-10-CM | POA: Insufficient documentation

## 2010-12-23 DIAGNOSIS — R079 Chest pain, unspecified: Secondary | ICD-10-CM | POA: Insufficient documentation

## 2010-12-23 LAB — DIFFERENTIAL
Lymphocytes Relative: 20 % (ref 12–46)
Lymphs Abs: 3.2 10*3/uL (ref 0.7–4.0)
Neutrophils Relative %: 75 % (ref 43–77)

## 2010-12-23 LAB — BASIC METABOLIC PANEL
BUN: 14 mg/dL (ref 6–23)
Chloride: 103 mEq/L (ref 96–112)
Glucose, Bld: 125 mg/dL — ABNORMAL HIGH (ref 70–99)
Potassium: 4 mEq/L (ref 3.5–5.1)

## 2010-12-23 LAB — CBC
HCT: 42.1 % (ref 36.0–46.0)
Hemoglobin: 14 g/dL (ref 12.0–15.0)
MCV: 90.3 fL (ref 78.0–100.0)
RBC: 4.66 MIL/uL (ref 3.87–5.11)
WBC: 16.2 10*3/uL — ABNORMAL HIGH (ref 4.0–10.5)

## 2010-12-23 LAB — POCT CARDIAC MARKERS: Troponin i, poc: <0.05 ng/mL (ref 0.00–0.09)

## 2010-12-23 MED ORDER — IOHEXOL 300 MG/ML  SOLN
80.0000 mL | Freq: Once | INTRAMUSCULAR | Status: AC | PRN
  Administered 2010-12-23: 80 mL via INTRAVENOUS

## 2010-12-26 ENCOUNTER — Encounter: Payer: Self-pay | Admitting: Internal Medicine

## 2010-12-26 DIAGNOSIS — Z0000 Encounter for general adult medical examination without abnormal findings: Secondary | ICD-10-CM | POA: Insufficient documentation

## 2010-12-29 ENCOUNTER — Encounter: Payer: Self-pay | Admitting: Internal Medicine

## 2010-12-29 ENCOUNTER — Ambulatory Visit (INDEPENDENT_AMBULATORY_CARE_PROVIDER_SITE_OTHER): Payer: BC Managed Care – PPO | Admitting: Internal Medicine

## 2010-12-29 VITALS — BP 130/70 | HR 85 | Temp 98.2°F | Resp 14 | Ht 67.0 in | Wt 180.2 lb

## 2010-12-29 DIAGNOSIS — J45909 Unspecified asthma, uncomplicated: Secondary | ICD-10-CM

## 2010-12-29 DIAGNOSIS — S2220XA Unspecified fracture of sternum, initial encounter for closed fracture: Secondary | ICD-10-CM

## 2010-12-29 DIAGNOSIS — R7302 Impaired glucose tolerance (oral): Secondary | ICD-10-CM

## 2010-12-29 DIAGNOSIS — I10 Essential (primary) hypertension: Secondary | ICD-10-CM

## 2010-12-29 DIAGNOSIS — R7309 Other abnormal glucose: Secondary | ICD-10-CM

## 2010-12-29 MED ORDER — HYDROCODONE-ACETAMINOPHEN 5-500 MG PO TABS: 1.0000 | ORAL_TABLET | Freq: Four times a day (QID) | ORAL | Status: AC | PRN

## 2010-12-29 NOTE — Assessment & Plan Note (Signed)
With ongoing currenltly mod to severe pain, for pain control (vicodin), forms filled out;  Should f/u with Dr Herbert Pun to assess ability to return to work

## 2010-12-29 NOTE — Patient Instructions (Signed)
Your "inital form" was filled out today Take all new medications as prescribed - the vicodin Continue all other medications as before You should be called when your FMLA is done Please plan to return to see Dr Everardo All June 8, to assess ability to go back to work
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
## 2010-12-30 DIAGNOSIS — Z0279 Encounter for issue of other medical certificate: Secondary | ICD-10-CM

## 2011-01-02 ENCOUNTER — Encounter: Payer: Self-pay | Admitting: Endocrinology

## 2011-01-02 ENCOUNTER — Ambulatory Visit (INDEPENDENT_AMBULATORY_CARE_PROVIDER_SITE_OTHER): Payer: BC Managed Care – PPO | Admitting: Endocrinology

## 2011-01-02 ENCOUNTER — Telehealth: Payer: Self-pay

## 2011-01-02 VITALS — BP 114/74 | HR 86 | Temp 98.3°F | Ht 67.0 in | Wt 182.4 lb

## 2011-01-02 DIAGNOSIS — J069 Acute upper respiratory infection, unspecified: Secondary | ICD-10-CM

## 2011-01-02 MED ORDER — MORPHINE SULFATE 15 MG PO TB12: 15.0000 mg | ORAL_TABLET | Freq: Two times a day (BID) | ORAL | Status: DC

## 2011-01-02 MED ORDER — CEFUROXIME AXETIL 250 MG PO TABS: 250.0000 mg | ORAL_TABLET | Freq: Two times a day (BID) | ORAL | Status: DC

## 2011-01-02 NOTE — Telephone Encounter (Signed)
Ov this afternoon (add-on ok).  The ov earlier this week was for a different reason.

## 2011-01-02 NOTE — Patient Instructions (Signed)
Here are prescriptions for extended-release pain medication, and an antibiotic. You should still take the vicodin as needed for pain.   Take loratadine-d (non-prescription) as needed for congestion, and an antihistamine eye drop as needed for eye symptoms.  Return here next week if you are not better.
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
## 2011-01-02 NOTE — Telephone Encounter (Signed)
Pt advised, added on for 3p today

## 2011-01-02 NOTE — Telephone Encounter (Signed)
Pt called stating she developed ST, ear pain, cough and chest congestion. Pt was seen in clinic by Dr Jonny Ruiz Monday 04/16 for an acute appt and is requesting Rx for ABX, please advise.

## 2011-01-11 ENCOUNTER — Encounter: Payer: Self-pay | Admitting: Internal Medicine

## 2011-01-11 DIAGNOSIS — R7302 Impaired glucose tolerance (oral): Secondary | ICD-10-CM

## 2011-01-11 HISTORY — DX: Impaired glucose tolerance (oral): R73.02

## 2011-01-11 NOTE — Assessment & Plan Note (Signed)
stable overall by hx and exam, most recent lab reviewed with pt, and pt to continue medical treatment as before  BP Readings from Last 3 Encounters:  01/02/11 114/74  12/29/10 130/70  11/06/10 122/78

## 2011-01-11 NOTE — Assessment & Plan Note (Signed)
stable overall by hx and exam, most recent lab reviewed with pt, and pt to continue medical treatment as before  Lab Results  Component Value Date   WBC 16.2* 12/23/2010   HGB 14.0 12/23/2010   HCT 42.1 12/23/2010   PLT 290 12/23/2010   CHOL 198 08/05/2010   TRIG 185.0* 08/05/2010   HDL 45.70 08/05/2010   LDLDIRECT 102.7 01/04/2008   ALT 24 08/05/2010   AST 25 08/05/2010   NA 138 12/23/2010   K 4.0 12/23/2010   CL 103 12/23/2010   CREATININE 0.59 12/23/2010   BUN 14 12/23/2010   CO2 25 12/23/2010   TSH 1.58 08/05/2010   HGBA1C 5.8 08/05/2010

## 2011-01-11 NOTE — Assessment & Plan Note (Signed)
stable overall by hx and exam, most recent lab reviewed with pt, and pt to continue medical treatment as before  Lab Results  Component Value Date   HGBA1C 5.8 08/05/2010

## 2011-01-30 NOTE — Discharge Summary (Signed)
[facility]  Patient:    [name], Laura                     MRN: [id number] Adm. Date:  [id number] Disc. Date: [id number] Attending:  Susa Raring                           Discharge Summary  HISTORY:                      This patient is a 55 year old female admitted to the hospital for complaints of pelvic pain, dyspareunia, dysmenorrhea that has been unresponsive to conservative measures.  She has a known history of pelvic and abdominal adhesions seen on laparoscopy about five years previously.  Pain continues.  She also has pelvic pain and pressure, dyspareunia because of uterine prolapse.  She is now admitted for definitive treatment.  PHYSICAL EXAMINATION  PELVIC:                       External genitalia is normal.  Cervix is smooth and prolapsed.  Uterus is normal size and prolapsed with the cervix.  Bladder and rectum are fairly well supported.  Uterus is tender to movement and palpation.  There is tenderness in the left adnexal area.  LABORATORIES:                 Admission hemogram was normal including a hemoglobin of 14, postoperative hemogram showed a hemoglobin of 11.3, hematocrit 32, white count differential normal.  Urine pregnancy test preoperative was normal.  Urine was negative preoperatively.  HOSPITAL COURSE:              Patient was taken to the operating room where a total abdominal hysterectomy, left salpingo-oophorectomy, lysis of adhesions was done.  There were extensive adhesions to lysis.  It took quite a while to get the pelvic organs exposed.  However, she had an uneventful surgery other than the difficulty with the adhesions.  Postoperative course was one that was uncomplicated.  She got good pain relief from her analgesics.  First postoperative day she was afebrile.  Started taking some fluids p.o.  By June 12 she was passing flatus, taking p.o. nourishment, ambulating.  Staples were removed from her wound which  was healing nicely.  Steri-Strips were applied. The patient was discharged from the hospital with instructions to have limited activity, no lifting, no straining, no vaginal penetration, and not to drive for two weeks.  She was to eat a regular diet.  She was to return to my office in one months time for followup care.  She was given a prescription for Tylox for pain.  Examination of the tissue removed showed adenomyosis of the uterus, some few small leiomyomata that were intramural and subserosal.  There were serous fibrous adhesions on the left fallopian tube.  There was no evidence of malignancy.  FINAL DIAGNOSES:              1. Chronic pelvic pain, dysmenorrhea,                                  dyspareunia.                               2. Uterine prolapse.  3. Multiple extensive abdominal and pelvic                                  adhesions involving the omentum, peritoneum,                                  left tube and ovary, and in fact the uterus.                               4. Adenomyosis of the uterus.                               5. Leiomyomata of the uterus.  OPERATIONS:                   1. Laparotomy with lysis of extensive pelvic and                                  abdominal adhesions.                               2. Total abdominal hysterectomy and left                                  salpingo-oophorectomy.  CONDITION ON DISCHARGE:       Improved. DD:  04/06/01 TD:  04/06/01 Job: [id number]

## 2011-01-30 NOTE — H&P (Signed)
[facility]  Patient:    Laura [name], Laura [name]                     MRN: [id number] Adm. Date:  [id number] Attending:  Susa Raring                         History and Physical  OFFICE NUMBER:                [phone]  HISTORY OF PRESENT ILLNESS:   The patient is a 55 year old gravida 1, para 1, admitted to the hospital because of long-standing pelvic pain, pressure, dysmenorrhea, and dyspareunia.  She had also some dysfunctional uterine bleeding and this was five years ago.  At that time, because of her pain, she had a D&C done and laparoscopy which showed extensive pelvic adhesions, which were lysed, and some which could not be because of the proximity of the left ovary and adhesions to vital structures.  She has actually done pretty well up until last year when her pain has gotten progressively worse.  She is not able to have intercourse because of the pain.  Her last menstrual period was Feb 07, 2001, and it was so painful that she was bed ridden for a couple of days. She has had one child and says she has no stress urinary incontinence to speak of. The patient is now admitted for definitive treatment for her chronic pelvic pain, dysmenorrhea, and dyspareunia.  Because of her known pelvic adhesions, this procedure will be done abdominally.  PAST MEDICAL HISTORY:         The patient had one pregnancy which went to term.  She has one living child.  She had the usual childhood diseases.  PAST SURGICAL HISTORY:        A left ovarian cyst removed in 1994.  In 1997, she had laparoscopy and a D&C for dysfunctional uterine bleeding, and for pelvic pain and adhesions.  ALLERGIES:                    NO KNOWN DRUG ALLERGIES.  CURRENT MEDICATIONS:          Celebrex, multivitamins, and calcium.  SOCIAL HISTORY:               She is a nonsmoker and only occasionally takes alcoholic beverages.  FAMILY HISTORY:               There is no history of diabetes,  tuberculosis, or bleeding disorders.  There is no history of colon cancer, ovarian cancer, or breast cancer.  REVIEW OF SYSTEMS:            ENT: No complaints.  CARDIORESPIRATORY: No complaints.  GI:  No complaints.  GU:  See Present Illness. NEUROLUSCULAR:  No complaints.  PHYSICAL EXAMINATION:  GENERAL:                      This is a well-nourished and well-developed 55 year old female in no acute distress.  VITAL SIGNS:                  She is 5 feet and 6-1/2 inches tall.  She weighs 155.  Blood pressure is 120/80, pulse 72 and regular, respirations 12, and temperature is 98.6.  HEAD AND NECK EXAMINATION:    Reveals pupils are equal and reactive to light and accommodation.  Ears, nose, and throat are normal.  Thyroid is normal size.  HEART:                        Rhythm regular with no murmurs heard.  LUNGS:                        Clear to auscultation and percussion.  BREASTS:                      No masses palpable.  ABDOMEN:                      Soft and flat.  No masses are palpable.  She has a healed suprapubic Pfannenstiel scar and a healed subumbilical scar.  SKIN:                         Smooth and dry.  EXTREMITIES:                  No deformities.  No limitation of motion. Distal pulses are present.  NEUROLOGICAL:                 Exam is physiologic.  PELVIC EXAMINATION:           External genitalia is normal with parous outlet.  The vagina is lined with healthy mucosa, and bladder and rectum are fairly well supported.  The cervix is smooth, however, and is prolapsed nearly 2 degrees.  The uterus itself is normal size.  It was tender to movement and palpation, and there is tenderness to palpation in the left adnexal area, although no masses can be felt.  The right side feels normal. There are no masses in the cul-de-sac.  RECTAL EXAMINATION:           Negative.  IMPRESSION:                   1. Chronic pelvic pain, dysmenorrhea, and                                   dyspareunia.                               2. Known history of pelvic and abdominal                                  adhesions.                               3. Uterine prolapse.  PLANNED MANAGEMENT:           The patient will be taken to the operating room where an abdominal approach will be used to do a laparotomy, lysis of adhesions, and do a hysterectomy, and left salpingo-oophorectomy.  The patient understands that she will no longer have periods and will be no longer able to have children.  The patient understands the hazards of pelvic surgery including, but not limited to bleeding, infection, injury to adjacent structures, and death.  She agrees to undergo this procedure. DD:  02/21/01 TD:  02/21/01 Job: [id number]

## 2011-01-30 NOTE — Op Note (Signed)
[facility]  Patient:    Laura [name], Laura [name]                     MRN: [id number] Proc. Date: 02/21/01 Adm. Date:  [id number] Attending:  Susa Raring                           Operative Report  PREOPERATIVE DIAGNOSES:       1. Chronic pelvic pain, with dysmenorrhea                                  and dyspareunia.                               2. Uterine prolapse.                               3. Known multiple pelvic adhesions.  POSTOPERATIVE DIAGNOSES:      1. Chronic pelvic pain, with dysmenorrhea                                  and dyspareunia.                               2. Uterine prolapse.                               3. Known multiple pelvic adhesions.                               4. Abdominal adhesions.  PROCEDURE:                    1. Laparotomy with lysis of adhesions.                               2. Total abdominal hysterectomy.                               3. Left salpingo-oophorectomy.  SURGEON:                      Luvenia Redden, M.D.  ASSISTANT:                    Gerrit Friends. Aldona Bar, M.D.  DESCRIPTION OF PROCEDURE:     Under good anesthesia, the patient was prepped and draped in a sterile manner.  The Foley catheter was placed in the bladder, and was draining clear urine.  The abdomen was entered through her old Pfannenstiel scar.  Bleeders were clamped and cauterized as encountered.  The peritoneum was entered.  There were multiple dense adhesions between the omentum and the anterior abdominal wall from the bladder, all the way up above the umbilicus.  These adhesions were extensive and required fairly long meticulous dissection to free them up from the abdominal wall, so that the abdominal and pelvic organs could be exposed.  Bleeders were cauterized as encountered.  There was a defect in the omentum, and this was closed using some interrupted Monocryl sutures.  Once this was accomplished, the upper abdominal organs  were explored, and no abnormalities could be palpated.  A retractor was placed into the incision.  The intestines and omentum were packed off into the upper abdomen with wet lap packs.  The pelvic organs could then be viewed.  The uterus was normal size and shape.  The right tube and ovary were normal.  There were dense adhesions involving the left tube and ovary, the sigmoid colon, and the pelvic peritoneum.  These were extensive. They were lysed by sharp and blunt dissection slowly and carefully, so as to avoid vital structures. Finally that tube and ovary were freed up so that it could be adequately exposed.  The ureter was identified and palpated well below the operative site.  The round ligaments were then ligated and divided. The bladder flap was developed by sharp and blunt dissection.  The left infundibular pelvic ligament was clamped, cut, and doubly ligated.  The right utero-ovarian ligament and fallopian tube were identified.  They were clamped, cut, and doubly ligated so as to conserve the right tube and ovary.  The bladder flap was further developed off of the anterior cervix.  The broad ligaments were skeletonized.  They were then clamped, cut, and ligated.  The Cardinal ligaments were clamped, cut, and ligated in two bites.  The uterosacral ligaments were clamped, cut, and ligated.  The vagina was entered anteriorly.  The cervix was circumcised.  The specimen was removed.  The lateral vaginal apices were secured with figure-of-eight Monocryl sutures. The cuff was closed with figure-of-eight Monocryl sutures.  The right tube and ovary then were suspended up to the round ligament, to keep it out of the pelvis, to help prevent a trapped ovary syndrome on that right side in the remaining ovary.  The pelvis was irrigated with saline, and this was aspirated.  Small individual bleeders were coagulated.  The uterosacral ligaments were approximated with a Monocryl suture.  The  peritoneal flap was pulled down over the apex of the vagina with a single suture.  The ureters were again viewed and seen to be functioning and were well out of the operative site.  There was clear urine coming from the bladder in this part of the procedure.  The peritoneal cavity was again irrigated with saline and this was aspirated.  Packs were removed.  The omentum was again inspected.  Small individuals were electrocoagulated.  After all the counts were correct, the peritoneum was closed with continuous Monocryl suture.  The rectus muscles were approximated with Monocryl in the midline.  The fascia was repaired with #0 Monocryl in a continuous fashion.  The subcutaneous tissues were approximated with interrupted #2 plain catgut.  The skin edges were approximated with wide skin staples.  The estimated blood loss was 200 cc. None was replaced.  The patient tolerated the procedure well and was removed to the recovery room in good condition. DD:  02/21/01 TD:  02/21/01 Job: [id number]

## 2011-02-18 ENCOUNTER — Ambulatory Visit (INDEPENDENT_AMBULATORY_CARE_PROVIDER_SITE_OTHER): Payer: BC Managed Care – PPO | Admitting: Endocrinology

## 2011-02-18 ENCOUNTER — Encounter: Payer: Self-pay | Admitting: Endocrinology

## 2011-02-18 DIAGNOSIS — I10 Essential (primary) hypertension: Secondary | ICD-10-CM

## 2011-02-18 NOTE — Progress Notes (Signed)
Subjective:    Patient ID: Laura [name], female    DOB: 1955/10/17, 55 y.o.   MRN: [id number]  HPI Chest-wall pain is much better recently.  She no longer needs the narcotics.  She takes the cozaar as rx'ed and tolerates well. Past Medical History  Diagnosis Date  . ESOPHAGEAL STRICTURE 02/17/2008  . ALLERGIC RHINITIS 01/25/2008  . GERD 01/25/2008  . ASTHMA 01/25/2008  . HYPERCHOLESTEROLEMIA 01/19/2008  . Unspecified disorder of liver 01/19/2008  . HYPERGLYCEMIA 01/19/2008  . RECTAL BLEEDING 01/04/2008  . 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 METATARSALGIA 11/08/2007  . Tibialis tendinitis 05/13/2010  . CONSTIPATION 02/21/2008  . VENTRICULAR HYPERTROPHY, LEFT 09/23/2010  . HYPERTENSION 08/05/2010  . ABNORMAL ELECTROCARDIOGRAM 08/05/2010  . ACNE, MILD 02/17/2008  . Impaired glucose tolerance 01/11/2011    Past Surgical History  Procedure Date  . Cholecystectomy   . Esophagogastroduodenoscopy 04/01/1998  . Nasal sinus surgery     s/p  . Ovarian cyst removal   . Arthroscopic repair acl 10/1998    Left knee     History   Social History  . Marital Status: Married    Spouse Name: N/A    Number of Children: N/A  . Years of Education: N/A   Occupational History  . Not on file.   Social History Main Topics  . Smoking status: Never Smoker   . Smokeless tobacco: Not on file  . Alcohol Use: No  . Drug Use: No  . Sexually Active:    Other Topics Concern  . Not on file   Social History Narrative   Pt does not get regular exercise.Daily Caffeine Use (12 oz Coke Zero per day)    Current Outpatient Prescriptions on File Prior to Visit  Medication Sig Dispense Refill  . aspirin 81 MG tablet Take 81 mg by mouth daily.        . Calcium Carbonate-Vitamin D (CALTRATE 600+D) 600-400 MG-UNIT per tablet Take 2 tablets by mouth daily.        . Fluticasone-Salmeterol (ADVAIR DISKUS) 100-50 MCG/DOSE AEPB Inhale 1 puff into the lungs daily as needed.        . lansoprazole (PREVACID) 30  MG capsule Take 30 mg by mouth daily.        Marland Kitchen losartan (COZAAR) 25 MG tablet Take 25 mg by mouth daily.        . Multiple Vitamin (MULTIVITAMIN) tablet Take 1 tablet by mouth daily.        . polyethylene glycol powder (MIRALAX) powder Take 17 g by mouth at bedtime.        . vitamin E (VITAMIN E) 400 UNIT capsule Take 400 Units by mouth daily.        Marland Kitchen DISCONTD: cefUROXime (CEFTIN) 250 MG tablet Take 1 tablet (250 mg total) by mouth 2 (two) times daily.  14 tablet  0  . DISCONTD: morphine (MS CONTIN) 15 MG 12 hr tablet Take 1 tablet (15 mg total) by mouth 2 (two) times daily.  28 tablet  0    No Known Allergies  Family History  Problem Relation Age of Onset  . Diabetes Mother   . Thyroid disease Mother     hypothyroidism  . Cancer Other     Pancreatic Cancer-Aunt    BP 122/78  Pulse 84  Temp(Src) 98.7 F (37.1 C) (Oral)  Ht 5\' 7"  (1.702 m)  Wt 184 lb 12.8 oz (83.825 kg)  BMI 28.94 kg/m2  SpO2 98%  Review of Systems denies chest pain and sob   Objective:   Physical Exam GENERAL: no distress LUNGS:  Clear to auscultation HEART:  Regular rate and rhythm without murmurs noted. Normal S1,S2.         Assessment & Plan:  Chest-wall contusion, better. Htn, well-controlled.

## 2011-02-18 NOTE — Patient Instructions (Addendum)
please consider these measures for your health:  minimize alcohol.  do not use tobacco products.  have a colonoscopy at least every 10 years from age 54.  keep firearms safely stored.  always use seat belts.  have working smoke alarms in your home.  see an eye doctor and dentist regularly.  never drive under the influence of alcohol or drugs (including prescription drugs).  those with fair skin should take precautions against the sun.   Please make a follow-up appointment in 6 months

## 2011-02-20 ENCOUNTER — Ambulatory Visit: Payer: BC Managed Care – PPO | Admitting: Endocrinology

## 2011-02-28 ENCOUNTER — Encounter: Payer: Self-pay | Admitting: Family Medicine

## 2011-02-28 ENCOUNTER — Ambulatory Visit (INDEPENDENT_AMBULATORY_CARE_PROVIDER_SITE_OTHER): Payer: BC Managed Care – PPO | Admitting: Family Medicine

## 2011-02-28 DIAGNOSIS — J209 Acute bronchitis, unspecified: Secondary | ICD-10-CM | POA: Insufficient documentation

## 2011-02-28 MED ORDER — AZITHROMYCIN 250 MG PO TABS: ORAL_TABLET | ORAL | Status: AC

## 2011-02-28 NOTE — Assessment & Plan Note (Signed)
With prod cough - but no reactive airways Disc sympt control- see inst  Cover with zithromax and update F/u if worse or no imp in 1 wk

## 2011-02-28 NOTE — Progress Notes (Signed)
Subjective:    Patient ID: Laura [name], female    DOB: 02-05-1956, 55 y.o.   MRN: [id number]  HPI Woke up tues am with a scratchy throat-used cough drops  Then got sore  Then ear pain and terrible cough - productive -- brown and yellow  Not a smoker  Blowing nose a lot  Ears -- pressure feeling  Slight fever over the week -- some chills   Hx of asthma but no need-no wheeze at this time  Non smoker   Patient Active Problem List  Diagnoses  . HYPERCHOLESTEROLEMIA  . HYPERTENSION  . ALLERGIC RHINITIS  . ASTHMA  . ESOPHAGEAL STRICTURE  . GERD  . CONSTIPATION  . RECTAL BLEEDING  . Unspecified disorder of liver  . ACNE, MILD  . METATARSALGIA  . Tibialis tendinitis  . Cavus deformity of foot, acquired  . ABNORMAL ELECTROCARDIOGRAM  . VENTRICULAR HYPERTROPHY, LEFT  . Preventative health care  . Sternal fracture  . Acute upper respiratory infections of unspecified site  . Impaired glucose tolerance  . Acute bronchitis   Past Medical History  Diagnosis Date  . ESOPHAGEAL STRICTURE 02/17/2008  . ALLERGIC RHINITIS 01/25/2008  . GERD 01/25/2008  . ASTHMA 01/25/2008  . HYPERCHOLESTEROLEMIA 01/19/2008  . Unspecified disorder of liver 01/19/2008  . HYPERGLYCEMIA 01/19/2008  . RECTAL BLEEDING 01/04/2008  . 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 METATARSALGIA 11/08/2007  . Tibialis tendinitis 05/13/2010  . CONSTIPATION 02/21/2008  . VENTRICULAR HYPERTROPHY, LEFT 09/23/2010  . HYPERTENSION 08/05/2010  . ABNORMAL ELECTROCARDIOGRAM 08/05/2010  . ACNE, MILD 02/17/2008  . Impaired glucose tolerance 01/11/2011   Past Surgical History  Procedure Date  . Cholecystectomy   . Esophagogastroduodenoscopy 04/01/1998  . Nasal sinus surgery     s/p  . Ovarian cyst removal   . Arthroscopic repair acl 10/1998    Left knee    History  Substance Use Topics  . Smoking status: Never Smoker   . Smokeless tobacco: Not on file  . Alcohol Use: No   Family History  Problem Relation Age of  Onset  . Diabetes Mother   . Thyroid disease Mother     hypothyroidism  . Cancer Other     Pancreatic Cancer-Aunt   No Known Allergies Current Outpatient Prescriptions on File Prior to Visit  Medication Sig Dispense Refill  . aspirin 81 MG tablet Take 81 mg by mouth daily.        . Calcium Carbonate-Vitamin D (CALTRATE 600+D) 600-400 MG-UNIT per tablet Take 2 tablets by mouth daily.        . Fluticasone-Salmeterol (ADVAIR DISKUS) 100-50 MCG/DOSE AEPB Inhale 1 puff into the lungs daily as needed.        . lansoprazole (PREVACID) 30 MG capsule Take 30 mg by mouth daily.        Marland Kitchen losartan (COZAAR) 25 MG tablet Take 25 mg by mouth daily.        . Multiple Vitamin (MULTIVITAMIN) tablet Take 1 tablet by mouth daily.        . polyethylene glycol powder (MIRALAX) powder Take 17 g by mouth at bedtime.        . vitamin E (VITAMIN E) 400 UNIT capsule Take 400 Units by mouth daily.             Review of Systems Review of Systems  Constitutional: Negative for fever, appetite change, and unexpected weight change.pos for fatigue  Eyes: Negative for pain and visual disturbance.  ENT pos for ear pain/  congestion/ ST Respiratory: pos for cough , neg for sob.   Cardiovascular: Negative.  For cp or palp Gastrointestinal: Negative for nausea, diarrhea and constipation.  Genitourinary: Negative for urgency and frequency.  Skin: Negative for pallor. or rash  Neurological: Negative for weakness, light-headedness, numbness and headaches.  Hematological: Negative for adenopathy. Does not bruise/bleed easily.  Psychiatric/Behavioral: Negative for dysphoric mood. The patient is not nervous/anxious.          Objective:   Physical Exam  Constitutional: She appears well-developed and well-nourished. No distress.  HENT:  Head: Normocephalic and atraumatic.  Right Ear: External ear normal.  Left Ear: External ear normal.       Nares congested and injected bilat  No sinus tenderness Post nasal drip]  post pharynx injected slightly  Eyes: Conjunctivae and EOM are normal. Pupils are equal, round, and reactive to light. Right eye exhibits no discharge. Left eye exhibits no discharge.  Neck: Normal range of motion. Neck supple. No JVD present.  Cardiovascular: Normal rate, regular rhythm and normal heart sounds.   Pulmonary/Chest: Effort normal and breath sounds normal. No respiratory distress. She has no rales. She exhibits no tenderness.       Diffusely harsh bs  Wheeze on foreced exp only  Lymphadenopathy:    She has no cervical adenopathy.  Skin: Skin is warm and dry. No rash noted.  Psychiatric: She has a normal mood and affect.          Assessment & Plan:

## 2011-02-28 NOTE — Patient Instructions (Signed)
Drink lots of fluids  Take mucinex or mucinex DM for cough  Take zithromax as directed  Use rescue inhaler if needed If worse or not improved in 1 week- please follow up

## 2011-03-23 ENCOUNTER — Ambulatory Visit
Admission: RE | Admit: 2011-03-23 | Discharge: 2011-03-23 | Disposition: A | Discharge status: Home / Self Care | Payer: BC Managed Care – PPO | Source: Ambulatory Visit | Attending: Allergy and Immunology | Admitting: Allergy and Immunology

## 2011-03-23 ENCOUNTER — Other Ambulatory Visit: Payer: Self-pay | Admitting: Allergy and Immunology

## 2011-06-15 LAB — HM MAMMOGRAPHY: HM Mammogram: NORMAL

## 2011-07-09 ENCOUNTER — Other Ambulatory Visit: Payer: Self-pay | Admitting: Gynecology

## 2011-08-01 ENCOUNTER — Encounter: Payer: Self-pay | Admitting: Family Medicine

## 2011-08-01 ENCOUNTER — Ambulatory Visit (INDEPENDENT_AMBULATORY_CARE_PROVIDER_SITE_OTHER): Payer: BC Managed Care – PPO | Admitting: Family Medicine

## 2011-08-01 VITALS — BP 124/76 | HR 94 | Temp 98.5°F | Wt 183.5 lb

## 2011-08-01 DIAGNOSIS — J209 Acute bronchitis, unspecified: Secondary | ICD-10-CM

## 2011-08-01 MED ORDER — AZITHROMYCIN 250 MG PO TABS: ORAL_TABLET | ORAL | Status: AC

## 2011-08-01 MED ORDER — METHYLPREDNISOLONE ACETATE 80 MG/ML IJ SUSP
80.0000 mg | Freq: Once | INTRAMUSCULAR | Status: AC
  Administered 2011-08-01: 80 mg via INTRAMUSCULAR

## 2011-08-01 NOTE — Assessment & Plan Note (Addendum)
Some reactivity to cough but no wheezing on exam today. Productive cough. Treat with zpack and increased advair/albuterol scheduled. Pt requests steroid shot - will provide today as seems to do better with this according to story. Update [place] if not improving. Denies h/o DM. Lab Results  Component Value Date   HGBA1C 5.8 08/05/2010

## 2011-08-01 NOTE — Patient Instructions (Addendum)
you have bronchitis. Shot of steroids today. Treat with zpack as well as increased advair to 2 puffs twice daily for the next week, albuterol regularly for 2 days. Please let us know if fever>101, worsening productive cough. If not better, please be evaluated again.

## 2011-08-01 NOTE — Progress Notes (Signed)
  Subjective:    Patient ID: Laura [name], female    DOB: 02/18/1956, 55 y.o.   MRN: [id number]  HPI CC: congestion, ST, earache  55yo with h/o asthma who presents with 5d h/o earache, sore throat, headache, sinus congestion.  Now with cough as well.  Spitting up mucous, feeling some congestion in chest.  Tends to get bronchitis easily, last time treated with zpack and then developed asthmatic bronchitis, had to see allergy doctor for steroid shot.  Requests shot today.  Earlier in illness had chills, no longer.    On advair 100/50 1 puff prn? (has started using recently) and albuterol prn.  Currently using both.  Also using astelin nasal spray  No chest pain, shortness of breath, abd pain, n/v, rashes, myalgia/arthralgia.  Son sick last week.  No smokers at home.    Review of Systems per HPI    Objective:   Physical Exam  Nursing note and vitals reviewed. Constitutional: She appears well-developed and well-nourished. No distress.       coughing  HENT:  Head: Normocephalic and atraumatic.  Right Ear: Hearing, tympanic membrane, external ear and ear canal normal.  Left Ear: Hearing, tympanic membrane, external ear and ear canal normal.  Nose: No mucosal edema or rhinorrhea. Right sinus exhibits no maxillary sinus tenderness and no frontal sinus tenderness. Left sinus exhibits no maxillary sinus tenderness and no frontal sinus tenderness.  Mouth/Throat: Uvula is midline, oropharynx is clear and moist and mucous membranes are normal. No oropharyngeal exudate, posterior oropharyngeal edema, posterior oropharyngeal erythema or tonsillar abscesses.  Eyes: Conjunctivae and EOM are normal. Pupils are equal, round, and reactive to light. No scleral icterus.  Neck: Normal range of motion. Neck supple.  Cardiovascular: Normal rate, regular rhythm, normal heart sounds and intact distal pulses.   No murmur heard. Pulmonary/Chest: Effort normal. No respiratory distress. She has no wheezes. She  has no rales.       Slightly decreased breath sounds at bases  Lymphadenopathy:    She has no cervical adenopathy.  Skin: Skin is warm and dry. No rash noted.       Assessment & Plan:

## 2011-08-01 NOTE — Progress Notes (Signed)
Addended by: Josph Macho A on: 08/01/2011 10:00 AM   Modules accepted: Orders

## 2011-08-20 ENCOUNTER — Ambulatory Visit: Payer: BC Managed Care – PPO | Admitting: Endocrinology

## 2011-10-23 ENCOUNTER — Telehealth: Payer: Self-pay | Admitting: *Deleted

## 2011-10-23 DIAGNOSIS — Z0000 Encounter for general adult medical examination without abnormal findings: Secondary | ICD-10-CM

## 2011-10-23 DIAGNOSIS — R739 Hyperglycemia, unspecified: Secondary | ICD-10-CM

## 2011-10-23 NOTE — Telephone Encounter (Signed)
Message copied by Carin Primrose on Fri Oct 23, 2011 11:53 AM ------      Message from: Newell Coral      Created: Fri Oct 16, 2011  3:48 PM      Regarding: cpe sch, needs labs       The pt has sche her cpe and needs labs, thanks!

## 2011-10-23 NOTE — Telephone Encounter (Signed)
Labs placed into Epic for upcoming CPX appointment.  

## 2011-11-30 ENCOUNTER — Ambulatory Visit (INDEPENDENT_AMBULATORY_CARE_PROVIDER_SITE_OTHER): Payer: BC Managed Care – PPO | Admitting: Endocrinology

## 2011-11-30 ENCOUNTER — Other Ambulatory Visit (INDEPENDENT_AMBULATORY_CARE_PROVIDER_SITE_OTHER): Payer: BC Managed Care – PPO

## 2011-11-30 ENCOUNTER — Encounter: Payer: Self-pay | Admitting: Endocrinology

## 2011-11-30 VITALS — BP 124/82 | HR 78 | Temp 97.8°F | Ht 67.0 in | Wt 175.0 lb

## 2011-11-30 DIAGNOSIS — R7309 Other abnormal glucose: Secondary | ICD-10-CM

## 2011-11-30 DIAGNOSIS — Z0000 Encounter for general adult medical examination without abnormal findings: Secondary | ICD-10-CM

## 2011-11-30 DIAGNOSIS — R739 Hyperglycemia, unspecified: Secondary | ICD-10-CM

## 2011-11-30 DIAGNOSIS — I10 Essential (primary) hypertension: Secondary | ICD-10-CM

## 2011-11-30 LAB — URINALYSIS, ROUTINE W REFLEX MICROSCOPIC
Hgb urine dipstick: NEGATIVE
Nitrite: NEGATIVE
Total Protein, Urine: NEGATIVE
Urobilinogen, UA: 0.2 (ref 0.0–1.0)

## 2011-11-30 LAB — HEMOGLOBIN A1C: Hgb A1c MFr Bld: 5.8 % (ref 4.6–6.5)

## 2011-11-30 LAB — HEPATIC FUNCTION PANEL
ALT: 31 U/L (ref 0–35)
Albumin: 4.3 g/dL (ref 3.5–5.2)
Total Bilirubin: 0.4 mg/dL (ref 0.3–1.2)

## 2011-11-30 LAB — LIPID PANEL
HDL: 39.2 mg/dL (ref >39.00)
Triglycerides: 194 mg/dL — ABNORMAL HIGH (ref 0.0–149.0)
VLDL: 38.8 mg/dL (ref 0.0–40.0)

## 2011-11-30 LAB — BASIC METABOLIC PANEL
BUN: 17 mg/dL (ref 6–23)
Chloride: 105 mEq/L (ref 96–112)
Glucose, Bld: 98 mg/dL (ref 70–99)
Potassium: 3.6 mEq/L (ref 3.5–5.1)

## 2011-11-30 LAB — CBC WITH DIFFERENTIAL/PLATELET
Basophils Relative: 1 % (ref 0.0–3.0)
Eosinophils Relative: 1.3 % (ref 0.0–5.0)
Lymphocytes Relative: 54.1 % — ABNORMAL HIGH (ref 12.0–46.0)
Neutrophils Relative %: 37.7 % — ABNORMAL LOW (ref 43.0–77.0)
RBC: 4.63 Mil/uL (ref 3.87–5.11)
WBC: 6.2 10*3/uL (ref 4.5–10.5)

## 2011-11-30 LAB — TSH: TSH: 1.78 u[IU]/mL (ref 0.35–5.50)

## 2011-11-30 NOTE — Progress Notes (Signed)
Subjective:    Patient ID: Laura [name], female    DOB: Jul 11, 1956, 56 y.o.   MRN: [id number]  HPI here for regular wellness examination.  she's feeling pretty well in general, and says chronic med probs are stable, except as noted below.   Past Medical History  Diagnosis Date  . ESOPHAGEAL STRICTURE 02/17/2008  . ALLERGIC RHINITIS 01/25/2008  . GERD 01/25/2008  . ASTHMA 01/25/2008  . HYPERCHOLESTEROLEMIA 01/19/2008  . Unspecified disorder of liver 01/19/2008  . HYPERGLYCEMIA 01/19/2008  . RECTAL BLEEDING 01/04/2008  . 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 METATARSALGIA 11/08/2007  . Tibialis tendinitis 05/13/2010  . CONSTIPATION 02/21/2008  . VENTRICULAR HYPERTROPHY, LEFT 09/23/2010  . HYPERTENSION 08/05/2010  . ABNORMAL ELECTROCARDIOGRAM 08/05/2010  . ACNE, MILD 02/17/2008  . Impaired glucose tolerance 01/11/2011    Past Surgical History  Procedure Date  . Cholecystectomy   . Esophagogastroduodenoscopy 04/01/1998  . Nasal sinus surgery     s/p  . Ovarian cyst removal   . Arthroscopic repair acl 10/1998    Left knee     History   Social History  . Marital Status: Married    Spouse Name: N/A    Number of Children: N/A  . Years of Education: N/A   Occupational History  . Not on file.   Social History Main Topics  . Smoking status: Never Smoker   . Smokeless tobacco: Not on file  . Alcohol Use: No  . Drug Use: No  . Sexually Active:    Other Topics Concern  . Not on file   Social History Narrative   Pt does not get regular exercise.Daily Caffeine Use (12 oz Coke Zero per day)    Current Outpatient Prescriptions on File Prior to Visit  Medication Sig Dispense Refill  . aspirin 81 MG tablet Take 81 mg by mouth daily.        . Calcium Carbonate-Vitamin D (CALTRATE 600+D) 600-400 MG-UNIT per tablet Take 2 tablets by mouth daily.        . Fluticasone-Salmeterol (ADVAIR DISKUS) 100-50 MCG/DOSE AEPB Inhale 1 puff into the lungs daily as needed.        . lansoprazole  (PREVACID) 30 MG capsule Take 30 mg by mouth daily.        Marland Kitchen losartan (COZAAR) 25 MG tablet Take 25 mg by mouth daily.        . Multiple Vitamin (MULTIVITAMIN) tablet Take 1 tablet by mouth daily.        Marland Kitchen olmesartan (BENICAR) 20 MG tablet Take 20 mg by mouth daily.        . polyethylene glycol powder (MIRALAX) powder Take 17 g by mouth at bedtime.        . vitamin E (VITAMIN E) 400 UNIT capsule Take 400 Units by mouth daily.        Marland Kitchen terbinafine (LAMISIL) 250 MG tablet Take 250 mg by mouth daily.          No Known Allergies  Family History  Problem Relation Age of Onset  . Diabetes Mother   . Thyroid disease Mother     hypothyroidism  . Cancer Other     Pancreatic Cancer-Aunt    BP 124/82  Pulse 78  Temp(Src) 97.8 F (36.6 C) (Oral)  Ht 5\' 7"  (1.702 m)  Wt 175 lb (79.379 kg)  BMI 27.41 kg/m2  SpO2 96%     Review of Systems  Constitutional: Negative for fever and unexpected weight change.  HENT: Negative  for hearing loss.   Eyes: Negative for visual disturbance.  Respiratory: Negative for shortness of breath.   Cardiovascular: Negative for chest pain.  Gastrointestinal: Negative for anal bleeding.  Genitourinary: Negative for hematuria.  Musculoskeletal: Negative for back pain.  Skin: Negative for rash.  Neurological: Negative for syncope, numbness and headaches.  Hematological: Does not bruise/bleed easily.  Psychiatric/Behavioral: Negative for dysphoric mood.       Objective:   Physical Exam VS: see vs page GEN: no distress HEAD: head: no deformity eyes: no periorbital swelling, no proptosis external nose and ears are normal mouth: no lesion seen NECK: supple, thyroid is not enlarged CHEST WALL: no deformity LUNGS:  Clear to auscultation BREASTS:  sees gyn CV: reg rate and rhythm, no murmur ABD: abdomen is soft, nontender.  no hepatosplenomegaly.  not distended.  no hernia GENITALIA/RECTAL: sees gyn MUSCULOSKELETAL: muscle bulk and strength are grossly  normal.  no obvious joint swelling.  gait is normal and steady EXTEMITIES: no deformity.  no ulcer on the feet.  feet are of normal color and temp.  no edema PULSES: dorsalis pedis intact bilat.  no carotid bruit NEURO:  cn 2-12 grossly intact.   readily moves all 4's.  sensation is intact to touch on the feet SKIN:  Normal texture and temperature.  No rash or suspicious lesion is visible.   NODES:  None palpable at the neck PSYCH: alert, oriented x3.  Does not appear anxious nor depressed.      Assessment & Plan:  Wellness visit today, with problems stable, except as noted.

## 2011-11-30 NOTE — Patient Instructions (Addendum)
please consider these measures for your health:  minimize alcohol.  do not use tobacco products.  have a colonoscopy at least every 10 years from age 55.  Women should have an annual mammogram from age 40.  keep firearms safely stored.  always use seat belts.  have working smoke alarms in your home.  see an eye doctor and dentist regularly.  never drive under the influence of alcohol or drugs (including prescription drugs).  those with fair skin should take precautions against the sun. please let me know what your wishes would be, if artificial life support measures should become necessary.  it is critically important to prevent falling down (keep floor areas well-lit, dry, and free of loose objects.  If you have a cane, walker, or wheelchair, you should use it, even for short trips around the house.  Also, try not to rush).   Please return in 1 year.   

## 2011-12-01 ENCOUNTER — Telehealth: Payer: Self-pay | Admitting: *Deleted

## 2011-12-01 NOTE — Telephone Encounter (Signed)
Called pt to inform of lab results. Left message with pt's spouse for pt to callback office.

## 2011-12-02 DIAGNOSIS — Z0000 Encounter for general adult medical examination without abnormal findings: Secondary | ICD-10-CM | POA: Insufficient documentation

## 2011-12-02 NOTE — Telephone Encounter (Signed)
Pt informed of lab results and letter mailed to pt address.

## 2011-12-03 ENCOUNTER — Telehealth: Payer: Self-pay | Admitting: Endocrinology

## 2011-12-03 MED ORDER — CIPROFLOXACIN HCL 500 MG PO TABS: 500.0000 mg | ORAL_TABLET | Freq: Two times a day (BID) | ORAL | Status: AC

## 2011-12-03 NOTE — Telephone Encounter (Signed)
sent 

## 2011-12-03 NOTE — Telephone Encounter (Signed)
Pt is requesting antibiotic for uti--Pt says uti has began to bother her--[facility]--[place]--pt ph# [phone]

## 2011-12-03 NOTE — Telephone Encounter (Signed)
Pt informed of rx for UTI.

## 2011-12-21 ENCOUNTER — Other Ambulatory Visit: Payer: Self-pay | Admitting: *Deleted

## 2011-12-21 MED ORDER — LOSARTAN POTASSIUM 25 MG PO TABS: 25.0000 mg | ORAL_TABLET | Freq: Every day | ORAL | Status: DC

## 2011-12-21 NOTE — Telephone Encounter (Signed)
R'cd fax from [facility] for refill of Losartan. 

## 2011-12-22 ENCOUNTER — Encounter (HOSPITAL_COMMUNITY): Payer: Self-pay | Admitting: Pharmacy Technician

## 2011-12-22 ENCOUNTER — Encounter (HOSPITAL_COMMUNITY): Payer: Self-pay | Admitting: *Deleted

## 2011-12-22 NOTE — Progress Notes (Signed)
Per Dr. Glenna Durand office he will enter surgical orders.

## 2011-12-23 ENCOUNTER — Ambulatory Visit (HOSPITAL_COMMUNITY): Payer: Worker's Compensation | Admitting: Certified Registered"

## 2011-12-23 ENCOUNTER — Ambulatory Visit (HOSPITAL_COMMUNITY)
Admission: RE | Admit: 2011-12-23 | Discharge: 2011-12-24 | Disposition: A | Discharge status: Home / Self Care | Payer: Worker's Compensation | Source: Ambulatory Visit | Attending: Orthopedic Surgery | Admitting: Orthopedic Surgery

## 2011-12-23 ENCOUNTER — Encounter (HOSPITAL_COMMUNITY): Payer: Self-pay | Admitting: Certified Registered"

## 2011-12-23 ENCOUNTER — Encounter (HOSPITAL_COMMUNITY)
Admission: RE | Disposition: A | Discharge status: Home / Self Care | Payer: Self-pay | Source: Ambulatory Visit | Attending: Orthopedic Surgery

## 2011-12-23 ENCOUNTER — Encounter (HOSPITAL_COMMUNITY): Payer: Self-pay | Admitting: *Deleted

## 2011-12-23 ENCOUNTER — Telehealth: Payer: Self-pay | Admitting: *Deleted

## 2011-12-23 DIAGNOSIS — W19XXXA Unspecified fall, initial encounter: Secondary | ICD-10-CM | POA: Insufficient documentation

## 2011-12-23 DIAGNOSIS — I10 Essential (primary) hypertension: Secondary | ICD-10-CM | POA: Insufficient documentation

## 2011-12-23 DIAGNOSIS — Z0000 Encounter for general adult medical examination without abnormal findings: Secondary | ICD-10-CM

## 2011-12-23 DIAGNOSIS — S52279A Monteggia's fracture of unspecified ulna, initial encounter for closed fracture: Secondary | ICD-10-CM | POA: Insufficient documentation

## 2011-12-23 DIAGNOSIS — Y9269 Other specified industrial and construction area as the place of occurrence of the external cause: Secondary | ICD-10-CM | POA: Insufficient documentation

## 2011-12-23 DIAGNOSIS — J45909 Unspecified asthma, uncomplicated: Secondary | ICD-10-CM | POA: Insufficient documentation

## 2011-12-23 DIAGNOSIS — K219 Gastro-esophageal reflux disease without esophagitis: Secondary | ICD-10-CM | POA: Insufficient documentation

## 2011-12-23 HISTORY — DX: Other specified postprocedural states: Z98.890

## 2011-12-23 HISTORY — DX: Nausea with vomiting, unspecified: R11.2

## 2011-12-23 HISTORY — PX: ORIF ELBOW FRACTURE: SHX5031

## 2011-12-23 LAB — CBC
MCV: 89.5 fL (ref 78.0–100.0)
Platelets: 289 10*3/uL (ref 150–400)
RBC: 4.68 MIL/uL (ref 3.87–5.11)
RDW: 13.1 % (ref 11.5–15.5)
WBC: 9.7 10*3/uL (ref 4.0–10.5)

## 2011-12-23 SURGERY — OPEN REDUCTION INTERNAL FIXATION (ORIF) ELBOW/OLECRANON FRACTURE
Anesthesia: General | Laterality: Left

## 2011-12-23 MED ORDER — LIDOCAINE HCL (CARDIAC) 20 MG/ML IV SOLN
INTRAVENOUS | Status: DC | PRN
  Administered 2011-12-23: 50 mg via INTRAVENOUS

## 2011-12-23 MED ORDER — HYDROCODONE-ACETAMINOPHEN 5-325 MG PO TABS: 1.0000 | ORAL_TABLET | Freq: Once | ORAL | Status: DC

## 2011-12-23 MED ORDER — ASPIRIN EC 81 MG PO TBEC
81.0000 mg | DELAYED_RELEASE_TABLET | Freq: Every day | ORAL | Status: DC
  Administered 2011-12-24: 81 mg via ORAL
  Filled 2011-12-23 (×2): qty 1

## 2011-12-23 MED ORDER — DOCUSATE SODIUM 100 MG PO CAPS: 100.0000 mg | ORAL_CAPSULE | Freq: Two times a day (BID) | ORAL | Status: AC

## 2011-12-23 MED ORDER — CEFAZOLIN SODIUM 1-5 GM-% IV SOLN
INTRAVENOUS | Status: AC
  Filled 2011-12-23: qty 50

## 2011-12-23 MED ORDER — PROPOFOL 10 MG/ML IV EMUL
INTRAVENOUS | Status: DC | PRN
  Administered 2011-12-23: 200 mg via INTRAVENOUS

## 2011-12-23 MED ORDER — METHOCARBAMOL 500 MG PO TABS: 500.0000 mg | ORAL_TABLET | Freq: Four times a day (QID) | ORAL | Status: AC

## 2011-12-23 MED ORDER — FENTANYL CITRATE 0.05 MG/ML IJ SOLN
INTRAMUSCULAR | Status: DC | PRN
  Administered 2011-12-23 (×2): 50 ug via INTRAVENOUS
  Administered 2011-12-23: 100 ug via INTRAVENOUS
  Administered 2011-12-23: 50 ug via INTRAVENOUS

## 2011-12-23 MED ORDER — CEFAZOLIN SODIUM 1-5 GM-% IV SOLN
1.0000 g | INTRAVENOUS | Status: AC
  Administered 2011-12-23: 1 g via INTRAVENOUS
  Filled 2011-12-23: qty 50

## 2011-12-23 MED ORDER — KCL IN DEXTROSE-NACL 20-5-0.45 MEQ/L-%-% IV SOLN
INTRAVENOUS | Status: DC
  Administered 2011-12-23: 23:00:00 via INTRAVENOUS
  Filled 2011-12-23 (×2): qty 1000

## 2011-12-23 MED ORDER — ONDANSETRON HCL 4 MG/2ML IJ SOLN: 4.0000 mg | Freq: Four times a day (QID) | INTRAMUSCULAR | Status: DC | PRN

## 2011-12-23 MED ORDER — ONDANSETRON HCL 4 MG PO TABS: 4.0000 mg | ORAL_TABLET | Freq: Three times a day (TID) | ORAL | Status: AC | PRN

## 2011-12-23 MED ORDER — BUPIVACAINE HCL (PF) 0.25 % IJ SOLN
INTRAMUSCULAR | Status: DC | PRN
  Administered 2011-12-23: 10 mL

## 2011-12-23 MED ORDER — VITAMIN C 500 MG PO TABS
1000.0000 mg | ORAL_TABLET | Freq: Every day | ORAL | Status: DC
  Administered 2011-12-24: 1000 mg via ORAL
  Filled 2011-12-23: qty 2

## 2011-12-23 MED ORDER — LOSARTAN POTASSIUM 25 MG PO TABS
25.0000 mg | ORAL_TABLET | Freq: Every day | ORAL | Status: DC
  Administered 2011-12-23 – 2011-12-24 (×2): 25 mg via ORAL
  Filled 2011-12-23 (×2): qty 1

## 2011-12-23 MED ORDER — PANTOPRAZOLE SODIUM 40 MG PO TBEC
40.0000 mg | DELAYED_RELEASE_TABLET | Freq: Every day | ORAL | Status: DC
  Administered 2011-12-23 – 2011-12-24 (×2): 40 mg via ORAL
  Filled 2011-12-23 (×2): qty 1

## 2011-12-23 MED ORDER — DIPHENHYDRAMINE HCL 25 MG PO CAPS: 25.0000 mg | ORAL_CAPSULE | Freq: Four times a day (QID) | ORAL | Status: DC | PRN

## 2011-12-23 MED ORDER — SCOPOLAMINE 1 MG/3DAYS TD PT72
MEDICATED_PATCH | TRANSDERMAL | Status: DC | PRN
  Administered 2011-12-23: 1 via TRANSDERMAL

## 2011-12-23 MED ORDER — HYDROCODONE-ACETAMINOPHEN 5-325 MG PO TABS
1.0000 | ORAL_TABLET | ORAL | Status: DC | PRN
  Administered 2011-12-24 (×2): 2 via ORAL
  Filled 2011-12-23 (×2): qty 2

## 2011-12-23 MED ORDER — MIDAZOLAM HCL 5 MG/5ML IJ SOLN
INTRAMUSCULAR | Status: DC | PRN
  Administered 2011-12-23: 2 mg via INTRAVENOUS

## 2011-12-23 MED ORDER — OXYCODONE-ACETAMINOPHEN 5-325 MG PO TABS: 1.0000 | ORAL_TABLET | ORAL | Status: AC | PRN

## 2011-12-23 MED ORDER — ONDANSETRON HCL 4 MG PO TABS: 4.0000 mg | ORAL_TABLET | Freq: Four times a day (QID) | ORAL | Status: DC | PRN

## 2011-12-23 MED ORDER — HYDROCODONE-ACETAMINOPHEN 5-325 MG PO TABS
1.0000 | ORAL_TABLET | Freq: Once | ORAL | Status: AC
  Administered 2011-12-23: 1 via ORAL
  Filled 2011-12-23: qty 1

## 2011-12-23 MED ORDER — HYDROMORPHONE HCL PF 1 MG/ML IJ SOLN
0.2500 mg | INTRAMUSCULAR | Status: DC | PRN
  Administered 2011-12-23 (×5): 0.5 mg via INTRAVENOUS

## 2011-12-23 MED ORDER — CHLORHEXIDINE GLUCONATE 4 % EX LIQD: 60.0000 mL | Freq: Once | CUTANEOUS | Status: DC

## 2011-12-23 MED ORDER — METHOCARBAMOL 500 MG PO TABS
500.0000 mg | ORAL_TABLET | Freq: Four times a day (QID) | ORAL | Status: DC | PRN
  Administered 2011-12-23: 500 mg via ORAL
  Filled 2011-12-23 (×2): qty 1

## 2011-12-23 MED ORDER — CEFAZOLIN SODIUM 1-5 GM-% IV SOLN
1.0000 g | Freq: Three times a day (TID) | INTRAVENOUS | Status: DC
  Administered 2011-12-24: 1 g via INTRAVENOUS
  Filled 2011-12-23 (×3): qty 50

## 2011-12-23 MED ORDER — DOCUSATE SODIUM 100 MG PO CAPS
100.0000 mg | ORAL_CAPSULE | Freq: Two times a day (BID) | ORAL | Status: DC
  Administered 2011-12-23 – 2011-12-24 (×2): 100 mg via ORAL
  Filled 2011-12-23 (×2): qty 1

## 2011-12-23 MED ORDER — VITAMIN E 180 MG (400 UNIT) PO CAPS
400.0000 [IU] | ORAL_CAPSULE | Freq: Every day | ORAL | Status: DC
  Administered 2011-12-24: 400 [IU] via ORAL
  Filled 2011-12-23 (×2): qty 1

## 2011-12-23 MED ORDER — CEFAZOLIN SODIUM 1-5 GM-% IV SOLN
1.0000 g | INTRAVENOUS | Status: AC
  Administered 2011-12-23: 1 g via INTRAVENOUS

## 2011-12-23 MED ORDER — LACTATED RINGERS IV SOLN
INTRAVENOUS | Status: DC | PRN
  Administered 2011-12-23 (×3): via INTRAVENOUS

## 2011-12-23 MED ORDER — LACTATED RINGERS IV SOLN: INTRAVENOUS | Status: DC

## 2011-12-23 MED ORDER — MORPHINE SULFATE 4 MG/ML IJ SOLN: 0.0500 mg/kg | INTRAMUSCULAR | Status: DC | PRN

## 2011-12-23 MED ORDER — ROCURONIUM BROMIDE 100 MG/10ML IV SOLN
INTRAVENOUS | Status: DC | PRN
  Administered 2011-12-23: 50 mg via INTRAVENOUS

## 2011-12-23 MED ORDER — ALPRAZOLAM 0.5 MG PO TABS: 0.5000 mg | ORAL_TABLET | Freq: Four times a day (QID) | ORAL | Status: DC | PRN

## 2011-12-23 MED ORDER — SODIUM CHLORIDE 0.9 % IR SOLN
Status: DC | PRN
  Administered 2011-12-23: 1000 mL

## 2011-12-23 MED ORDER — OXYCODONE-ACETAMINOPHEN 5-325 MG PO TABS: 1.0000 | ORAL_TABLET | ORAL | Status: DC | PRN

## 2011-12-23 MED ORDER — METHOCARBAMOL 100 MG/ML IJ SOLN
500.0000 mg | Freq: Four times a day (QID) | INTRAVENOUS | Status: DC | PRN
  Filled 2011-12-23: qty 5

## 2011-12-23 MED ORDER — ONDANSETRON HCL 4 MG/2ML IJ SOLN
INTRAMUSCULAR | Status: DC | PRN
  Administered 2011-12-23: 4 mg via INTRAVENOUS

## 2011-12-23 MED ORDER — ADULT MULTIVITAMIN W/MINERALS CH
1.0000 | ORAL_TABLET | Freq: Every day | ORAL | Status: DC
  Administered 2011-12-24: 1 via ORAL
  Filled 2011-12-23: qty 1

## 2011-12-23 MED ORDER — HYDROMORPHONE HCL PF 1 MG/ML IJ SOLN
0.5000 mg | INTRAMUSCULAR | Status: DC | PRN
  Administered 2011-12-24 (×4): 1 mg via INTRAVENOUS
  Filled 2011-12-23 (×4): qty 1

## 2011-12-23 MED ORDER — ADULT MULTIVITAMIN W/MINERALS CH
1.0000 | ORAL_TABLET | Freq: Every day | ORAL | Status: DC
  Filled 2011-12-23 (×2): qty 1

## 2011-12-23 MED ORDER — MUPIROCIN 2 % EX OINT
TOPICAL_OINTMENT | CUTANEOUS | Status: AC
  Filled 2011-12-23: qty 22

## 2011-12-23 MED ORDER — ZOLPIDEM TARTRATE 5 MG PO TABS: 5.0000 mg | ORAL_TABLET | Freq: Every evening | ORAL | Status: DC | PRN

## 2011-12-23 MED ORDER — FLUTICASONE-SALMETEROL 100-50 MCG/DOSE IN AEPB
1.0000 | INHALATION_SPRAY | Freq: Every day | RESPIRATORY_TRACT | Status: DC | PRN
  Filled 2011-12-23: qty 14

## 2011-12-23 SURGICAL SUPPLY — 76 items
BANDAGE ELASTIC 3 VELCRO ST LF (GAUZE/BANDAGES/DRESSINGS) ×1 IMPLANT
BANDAGE ELASTIC 4 VELCRO ST LF (GAUZE/BANDAGES/DRESSINGS) ×1 IMPLANT
BANDAGE GAUZE ELAST BULKY 4 IN (GAUZE/BANDAGES/DRESSINGS) IMPLANT
BIT DRILL 2.5X2.75 QC CALB (BIT) ×1 IMPLANT
BIT DRILL CALIBRATED 2.7 (BIT) ×1 IMPLANT
BNDG CMPR 9X4 STRL LF SNTH (GAUZE/BANDAGES/DRESSINGS) ×1
BNDG COHESIVE 4X5 TAN STRL (GAUZE/BANDAGES/DRESSINGS) ×2 IMPLANT
BNDG ESMARK 4X9 LF (GAUZE/BANDAGES/DRESSINGS) ×2 IMPLANT
CLOTH BEACON ORANGE TIMEOUT ST (SAFETY) ×2 IMPLANT
CORDS BIPOLAR (ELECTRODE) ×2 IMPLANT
COVER MAYO STAND STRL (DRAPES) ×1 IMPLANT
COVER SURGICAL LIGHT HANDLE (MISCELLANEOUS) ×2 IMPLANT
CUFF TOURNIQUET SINGLE 18IN (TOURNIQUET CUFF) ×2 IMPLANT
CUFF TOURNIQUET SINGLE 24IN (TOURNIQUET CUFF) IMPLANT
DRAPE INCISE IOBAN 66X45 STRL (DRAPES) ×1 IMPLANT
DRAPE OEC MINIVIEW 54X84 (DRAPES) ×1 IMPLANT
DRAPE ORTHO SPLIT 77X108 STRL (DRAPES)
DRAPE SURG ORHT 6 SPLT 77X108 (DRAPES) ×2 IMPLANT
DRAPE U-SHAPE 47X51 STRL (DRAPES) ×3 IMPLANT
DRSG ADAPTIC 3X8 NADH LF (GAUZE/BANDAGES/DRESSINGS) IMPLANT
ELECT CAUTERY BLADE 6.4 (BLADE) ×1 IMPLANT
GAUZE XEROFORM 5X9 LF (GAUZE/BANDAGES/DRESSINGS) ×1 IMPLANT
GLOVE BIOGEL PI IND STRL 7.0 (GLOVE) IMPLANT
GLOVE BIOGEL PI IND STRL 8 (GLOVE) IMPLANT
GLOVE BIOGEL PI IND STRL 8.5 (GLOVE) ×1 IMPLANT
GLOVE BIOGEL PI INDICATOR 7.0 (GLOVE) ×1
GLOVE BIOGEL PI INDICATOR 8 (GLOVE) ×1
GLOVE BIOGEL PI INDICATOR 8.5 (GLOVE) ×1
GLOVE SS BIOGEL STRL SZ 8 (GLOVE) IMPLANT
GLOVE SUPERSENSE BIOGEL SZ 8 (GLOVE) ×1
GLOVE SURG ORTHO 8.0 STRL STRW (GLOVE) ×2 IMPLANT
GOWN PREVENTION PLUS XLARGE (GOWN DISPOSABLE) ×3 IMPLANT
GOWN STRL NON-REIN LRG LVL3 (GOWN DISPOSABLE) ×3 IMPLANT
K-WIRE FIXATION 2.0X6 (WIRE) ×4
KIT BASIN OR (CUSTOM PROCEDURE TRAY) ×2 IMPLANT
KIT ROOM TURNOVER OR (KITS) ×2 IMPLANT
KWIRE FIXATION 2.0X6 (WIRE) IMPLANT
LOOP VESSEL MAXI BLUE (MISCELLANEOUS) IMPLANT
MANIFOLD NEPTUNE II (INSTRUMENTS) ×2 IMPLANT
NDL HYPO 25GX1X1/2 BEV (NEEDLE) IMPLANT
NEEDLE HYPO 25GX1X1/2 BEV (NEEDLE) ×4 IMPLANT
NS IRRIG 1000ML POUR BTL (IV SOLUTION) ×2 IMPLANT
PACK ORTHO EXTREMITY (CUSTOM PROCEDURE TRAY) ×2 IMPLANT
PAD ARMBOARD 7.5X6 YLW CONV (MISCELLANEOUS) ×4 IMPLANT
PAD CAST 4YDX4 CTTN HI CHSV (CAST SUPPLIES) IMPLANT
PADDING CAST COTTON 4X4 STRL (CAST SUPPLIES) ×4
PLATE OLECRANON LRG (Plate) ×1 IMPLANT
SCREW CORTICAL 3.5X46MM (Screw) ×1 IMPLANT
SCREW LOCK CORT STAR 3.5X10 (Screw) ×1 IMPLANT
SCREW LOCK CORT STAR 3.5X12 (Screw) ×1 IMPLANT
SCREW LOCK CORT STAR 3.5X16 (Screw) ×2 IMPLANT
SCREW LOCK CORT STAR 3.5X18 (Screw) ×1 IMPLANT
SCREW LOCK CORT STAR 3.5X20 (Screw) ×1 IMPLANT
SCREW LOCK CORT STAR 3.5X24 (Screw) ×1 IMPLANT
SCREW LOW PROFILE 18MMX3.5MM (Screw) ×2 IMPLANT
SOAP 2 % CHG 4 OZ (WOUND CARE) ×2 IMPLANT
SPLINT FIBERGLASS 4X30 (CAST SUPPLIES) ×1 IMPLANT
SPONGE GAUZE 4X4 12PLY (GAUZE/BANDAGES/DRESSINGS) ×1 IMPLANT
SPONGE LAP 18X18 X RAY DECT (DISPOSABLE) ×1 IMPLANT
STAPLER VISISTAT 35W (STAPLE) ×1 IMPLANT
SUCTION FRAZIER TIP 10 FR DISP (SUCTIONS) IMPLANT
SUT MERSILENE 4 0 P 3 (SUTURE) IMPLANT
SUT PROLENE 4 0 PS 2 18 (SUTURE) IMPLANT
SUT VIC AB 0 CT1 27 (SUTURE) ×6
SUT VIC AB 0 CT1 27XBRD ANBCTR (SUTURE) IMPLANT
SUT VIC AB 2-0 CT1 27 (SUTURE) ×2
SUT VIC AB 2-0 CT1 TAPERPNT 27 (SUTURE) IMPLANT
SUT VICRYL 4-0 PS2 18IN ABS (SUTURE) ×1 IMPLANT
SYR CONTROL 10ML LL (SYRINGE) ×2 IMPLANT
TOWEL OR 17X24 6PK STRL BLUE (TOWEL DISPOSABLE) ×2 IMPLANT
TOWEL OR 17X26 10 PK STRL BLUE (TOWEL DISPOSABLE) ×4 IMPLANT
TUBE CONNECTING 12X1/4 (SUCTIONS) IMPLANT
UNDERPAD 30X30 INCONTINENT (UNDERPADS AND DIAPERS) ×2 IMPLANT
WASHER 3.5MM (Orthopedic Implant) ×1 IMPLANT
WATER STERILE IRR 1000ML POUR (IV SOLUTION) ×1 IMPLANT
YANKAUER SUCT BULB TIP NO VENT (SUCTIONS) ×1 IMPLANT

## 2011-12-23 NOTE — Telephone Encounter (Signed)
Labs placed into Epic for CPX appointment.  

## 2011-12-23 NOTE — Progress Notes (Signed)
Patient states she feels better. Is now smiling

## 2011-12-23 NOTE — Telephone Encounter (Signed)
Message copied by Carin Primrose on Wed Dec 23, 2011  3:15 PM ------      Message from: COUSIN, SHARON T      Created: Mon Nov 30, 2011  9:13 AM      Regarding: PHY DATE  11/30/12       THANKS

## 2011-12-23 NOTE — H&P (Signed)
Laura [name] is an 56 y.o. female.   Chief Complaint: LEFT ELBOW INJURY FROM FALL AT WORK HPI: PT FELL AT WORK SUSTAINED FRACTURE DISLOCATION OF ELBOW PT HERE FOR SURGERY ON ELBOW TO RESTORE ALIGNMENT OF ELBOW PT WITH NO PRIOR INJURY TO ELBOW PT IS LEFT DOMINANT  Past Medical History  Diagnosis Date  . ESOPHAGEAL STRICTURE 02/17/2008  . ALLERGIC RHINITIS 01/25/2008  . GERD 01/25/2008  . ASTHMA 01/25/2008  . Unspecified disorder of liver 01/19/2008  . HYPERGLYCEMIA 01/19/2008  . RECTAL BLEEDING 01/04/2008  . 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 METATARSALGIA 11/08/2007  . Tibialis tendinitis 05/13/2010  . CONSTIPATION 02/21/2008  . VENTRICULAR HYPERTROPHY, LEFT 09/23/2010  . HYPERTENSION 08/05/2010  . ABNORMAL ELECTROCARDIOGRAM 08/05/2010  . ACNE, MILD 02/17/2008  . Impaired glucose tolerance 01/11/2011  . PONV (postoperative nausea and vomiting)     Past Surgical History  Procedure Date  . Cholecystectomy   . Esophagogastroduodenoscopy 04/01/1998  . Nasal sinus surgery     s/p  . Ovarian cyst removal   . Arthroscopic repair acl 10/1998    Left knee   . US echocardiography 2011  . Cardiovascular stress test 2011    Family History  Problem Relation Age of Onset  . Diabetes Mother   . Thyroid disease Mother     hypothyroidism  . Cancer Other     Pancreatic Cancer-Aunt   Social History:  reports that she has never smoked. She does not have any smokeless tobacco history on file. She reports that she does not drink alcohol or use illicit drugs.  Allergies: No Known Allergies  Medications Prior to Admission  Medication Dose Route Frequency Provider Last Rate Last Dose  . ceFAZolin (ANCEF) IVPB 1 g/50 mL premix  1 g Intravenous 60 min Pre-Op Sharma Covert, MD      . chlorhexidine (HIBICLENS) 4 % liquid 4 application  60 mL Topical Once Sharma Covert, MD      . HYDROcodone-acetaminophen [facility]) 5-325 MG per tablet 1 tablet  1 tablet Oral Once Judie Petit, MD   1  tablet at 12/23/11 1549  . mupirocin ointment (BACTROBAN) 2 %           . DISCONTD: HYDROcodone-acetaminophen (NORCO) 5-325 MG per tablet 1 tablet  1 tablet Oral Once Judie Petit, MD       Medications Prior to Admission  Medication Sig Dispense Refill  . Calcium Carbonate-Vitamin D (CALTRATE 600+D) 600-400 MG-UNIT per tablet Take 2 tablets by mouth daily.       . Fluticasone-Salmeterol (ADVAIR DISKUS) 100-50 MCG/DOSE AEPB Inhale 1 puff into the lungs daily as needed. For shortness of breath.      . lansoprazole (PREVACID) 30 MG capsule Take 30 mg by mouth daily as needed. For reflux.      . Multiple Vitamin (MULTIVITAMIN) tablet Take 1 tablet by mouth daily.        . vitamin E (VITAMIN E) 400 UNIT capsule Take 400 Units by mouth daily.          Results for orders placed during the hospital encounter of 12/23/11 (from the past 48 hour(s))  CBC     Status: Normal   Collection Time   12/23/11  2:00 PM      Component Value Range Comment   WBC 9.7  4.0 - 10.5 (K/uL)    RBC 4.68  3.87 - 5.11 (MIL/uL)    Hemoglobin 13.8  12.0 - 15.0 (g/dL)    HCT 41.9  36.0 - 46.0 (%)    MCV 89.5  78.0 - 100.0 (fL)    MCH 29.5  26.0 - 34.0 (pg)    MCHC 32.9  30.0 - 36.0 (g/dL)    RDW 13.0  86.5 - 78.4 (%)    Platelets 289  150 - 400 (K/uL)    No results found.  NO RECENT ILLNESSES OR HOSPITALIZATIONS  Blood pressure 144/82, pulse 100, temperature 98.5 F (36.9 C), temperature source Oral, resp. rate 20, height 5\' 7"  (1.702 m), weight 79.379 kg (175 lb), SpO2 97.00%. General Appearance:  Alert, cooperative, no distress, appears stated age  Head:  Normocephalic, without obvious abnormality, atraumatic  Eyes:  Pupils equal, conjunctiva/corneas clear,         Throat: Lips, mucosa, and tongue normal; teeth and gums normal  Neck: No visible masses     Lungs:   respirations unlabored  Chest Wall:  No tenderness or deformity  Heart:  Regular rate and rhythm,  Abdomen:   Soft, non-tender,          Extremities: LEFT UE: LONG ARM SPLINT IN PLACE ABLE TO CROSS FINGERS FINGERS WARM WELL PERFUSED ABLE TO EXTEND THUMB AND FLEX DIGITS  Pulses: 2+ and symmetric  Skin: Skin color, texture, turgor normal, no rashes or lesions     Neurologic: Normal     Assessment/Plan Left proximal olecranon fracture dislocation of radial head, monteggia fracture/dislocation  Left elbow proximal olecranon open reduction and internal fixation and possible radial head arthroplasty  R/B/A DISCUSSED WITH PT IN OFFICE.  PT VOICED UNDERSTANDING OF PLAN CONSENT SIGNED DAY OF SURGERY PT SEEN AND EXAMINED PRIOR TO OPERATIVE PROCEDURE/DAY OF SURGERY SITE MARKED. QUESTIONS ANSWERED WILL REMAIN AN INPATIENT FOLLOWING SURGERY  Sharma Covert 12/23/2011, 5:02 PM

## 2011-12-23 NOTE — Transfer of Care (Signed)
Immediate Anesthesia Transfer of Care Note  Patient: Laura [name]  Procedure(s) Performed: Procedure(s) (LRB): OPEN REDUCTION INTERNAL FIXATION (ORIF) ELBOW/OLECRANON FRACTURE (Left)  Patient Location: PACU  Anesthesia Type: General  Level of Consciousness: awake  Airway & Oxygen Therapy: Patient Spontanous Breathing and Patient connected to nasal cannula oxygen  Post-op Assessment: Report given to PACU RN and Post -op Vital signs reviewed and stable  Post vital signs: Reviewed and stable  Complications: No apparent anesthesia complications

## 2011-12-23 NOTE — Anesthesia Preprocedure Evaluation (Addendum)
Anesthesia Evaluation  Patient identified by MRN, date of birth, ID band Patient awake    Reviewed: Allergy & Precautions, H&P , NPO status , Patient's Chart, lab work & pertinent test results  History of Anesthesia Complications (+) PONV  Airway Mallampati: II TM Distance: >3 FB Neck ROM: Full    Dental  (+) Teeth Intact and Dental Advisory Given   Pulmonary asthma ,  breath sounds clear to auscultation        Cardiovascular hypertension, Pt. on medications Rate:Normal     Neuro/Psych    GI/Hepatic Neg liver ROS, GERD-  ,  Endo/Other    Renal/GU negative Renal ROS     Musculoskeletal   Abdominal   Peds  Hematology negative hematology ROS (+)   Anesthesia Other Findings   Reproductive/Obstetrics                          Anesthesia Physical Anesthesia Plan  ASA: III  Anesthesia Plan: General   Post-op Pain Management:    Induction: Intravenous  Airway Management Planned: Oral ETT  Additional Equipment:   Intra-op Plan:   Post-operative Plan: Extubation in OR  Informed Consent:   Dental advisory given  Plan Discussed with: CRNA  Anesthesia Plan Comments:         Anesthesia Quick Evaluation

## 2011-12-23 NOTE — Anesthesia Procedure Notes (Signed)
Procedure Name: Intubation Date/Time: 12/23/2011 5:35 PM Performed by: Jefm Miles E Pre-anesthesia Checklist: Patient identified, Timeout performed, Emergency Drugs available, Suction available and Patient being monitored Patient Re-evaluated:Patient Re-evaluated prior to inductionOxygen Delivery Method: Circle system utilized Preoxygenation: Pre-oxygenation with 100% oxygen Intubation Type: IV induction Ventilation: Mask ventilation without difficulty Laryngoscope Size: Mac and 3 Grade View: Grade I Tube type: Oral Tube size: 7.0 mm Number of attempts: 1 Airway Equipment and Method: Stylet Placement Confirmation: ETT inserted through vocal cords under direct vision,  breath sounds checked- equal and bilateral and positive ETCO2 Secured at: 23 cm Tube secured with: Tape Dental Injury: Teeth and Oropharynx as per pre-operative assessment

## 2011-12-23 NOTE — Discharge Instructions (Signed)
KEEP BANDAGE CLEAN AND DRY °CALL OFFICE FOR F/U APPT [phone] IN 12 DAYS °KEEP HAND ELEVATED ABOVE HEART °OK TO APPLY ICE TO OPERATIVE AREA °CONTACT OFFICE IF ANY WORSENING PAIN OR CONCERNS. °

## 2011-12-23 NOTE — Brief Op Note (Signed)
12/23/2011  7:59 PM  PATIENT:  Laura [name]  56 y.o. female  PRE-OPERATIVE DIAGNOSIS:  LEFT ELBOW OLECRANON AND RADIAL HEAD FRACTURE  POST-OPERATIVE DIAGNOSIS:  Left Elbow Olecranon and Radial Head Fracture   PROCEDURE:  Procedure(s) (LRB): OPEN REDUCTION INTERNAL FIXATION (ORIF) ELBOW/OLECRANON FRACTURE (Left)  SURGEON:  Surgeon(s) and Role:    * Sharma Covert, MD - Primary    * Drucilla Schmidt, MD - Assisting  PHYSICIAN ASSISTANT: NONE  ASSISTANTS: APLINGTON   ANESTHESIA:   general  EBL:  Total I/O In: 1200 [I.V.:1200] Out: 25 [Blood:25]  BLOOD ADMINISTERED:none  DRAINS: none   LOCAL MEDICATIONS USED:  MARCAINE     SPECIMEN:  No Specimen  DISPOSITION OF SPECIMEN:  N/A  COUNTS:  YES  TOURNIQUET:  * Missing tourniquet times found for documented tourniquets in log:  [id number] *  DICTATION: .Other Dictation: Dictation Number [id number]  PLAN OF CARE: Admit for overnight observation  PATIENT DISPOSITION:  PACU - hemodynamically stable.   Delay start of Pharmacological VTE agent (>24hrs) due to surgical blood loss or risk of bleeding: not applicable

## 2011-12-23 NOTE — Preoperative (Signed)
Beta Blockers   Reason not to administer Beta Blockers:Not Applicable 

## 2011-12-23 NOTE — Anesthesia Postprocedure Evaluation (Signed)
Anesthesia Post Note  Patient: Laura [name]  Procedure(s) Performed: Procedure(s) (LRB): OPEN REDUCTION INTERNAL FIXATION (ORIF) ELBOW/OLECRANON FRACTURE (Left)  Anesthesia type: general  Patient location: PACU  Post pain: Pain level controlled  Post assessment: Patient's Cardiovascular Status Stable  Last Vitals:  Filed Vitals:   12/23/11 2015  BP: 172/78  Pulse: 110  Temp:   Resp: 17    Post vital signs: Reviewed and stable  Level of consciousness: sedated  Complications: No apparent anesthesia complications

## 2011-12-24 ENCOUNTER — Encounter (HOSPITAL_COMMUNITY): Payer: Self-pay | Admitting: Orthopedic Surgery

## 2011-12-24 MED FILL — Mupirocin Oint 2%: CUTANEOUS | Qty: 22 | Status: AC

## 2011-12-24 NOTE — Discharge Summary (Signed)
Physician Discharge Summary  Patient ID: DANELIA SNODGRASS MRN: [id number] DOB/AGE: [birth year] 56 y.o.  Admit date: 12/23/2011 Discharge date: 12/24/2011  Admission Diagnoses: LEFT ELBOW OLECRANON AND RADIAL HEAD FRACTURE Past Medical History  Diagnosis Date  . ESOPHAGEAL STRICTURE 02/17/2008  . ALLERGIC RHINITIS 01/25/2008  . GERD 01/25/2008  . ASTHMA 01/25/2008  . Unspecified disorder of liver 01/19/2008  . HYPERGLYCEMIA 01/19/2008  . RECTAL BLEEDING 01/04/2008  . 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 METATARSALGIA 11/08/2007  . Tibialis tendinitis 05/13/2010  . CONSTIPATION 02/21/2008  . VENTRICULAR HYPERTROPHY, LEFT 09/23/2010  . HYPERTENSION 08/05/2010  . ABNORMAL ELECTROCARDIOGRAM 08/05/2010  . ACNE, MILD 02/17/2008  . Impaired glucose tolerance 01/11/2011  . PONV (postoperative nausea and vomiting)     Discharge Diagnoses:  Monteggia fracture dislocation  Surgeries: Procedure(s): OPEN REDUCTION INTERNAL FIXATION (ORIF) ELBOW/OLECRANON FRACTURE on 12/23/2011    Consultants:    Discharged Condition: Improved  Hospital Course: CHAKARA BOGNAR is an 56 y.o. female who was admitted 12/23/2011 with a chief complaint of No chief complaint on file. , and found to have a diagnosis of LEFT ELBOW OLECRANON AND RADIAL HEAD FRACTURE.  They were brought to the operating room on 12/23/2011 and underwent Procedure(s): OPEN REDUCTION INTERNAL FIXATION (ORIF) ELBOW/OLECRANON FRACTURE.    They were given perioperative antibiotics: Anti-infectives     Start     Dose/Rate Route Frequency Ordered Stop   12/24/11 0600   ceFAZolin (ANCEF) IVPB 1 g/50 mL premix        1 g 100 mL/hr over 30 Minutes Intravenous 3 times per day 12/23/11 2202     12/23/11 2215   ceFAZolin (ANCEF) IVPB 1 g/50 mL premix        1 g 100 mL/hr over 30 Minutes Intravenous NOW 12/23/11 2202 12/23/11 2337   12/23/11 1328   ceFAZolin (ANCEF) IVPB 1 g/50 mL premix        1 g 100 mL/hr over 30 Minutes Intravenous 60 min  pre-op 12/23/11 1328 12/23/11 1726        .  They were given sequential compression devices, early ambulation, and scd's for DVT prophylaxis.  Recent vital signs: Patient Vitals for the past 24 hrs:  BP Temp Temp src Pulse Resp SpO2  12/24/11 1017 128/70 mmHg 99.9 F (37.7 C) Oral 105  16  92 %  12/24/11 0300 133/80 mmHg 98.5 F (36.9 C) Oral 96  16  90 %  12/23/11 2212 137/79 mmHg 98.4 F (36.9 C) Oral 103  16  91 %  12/23/11 2115 142/73 mmHg 97.4 F (36.3 C) - 94  16  95 %  12/23/11 2100 163/81 mmHg - - 91  14  96 %  12/23/11 2045 147/72 mmHg - - 95  15  96 %  12/23/11 2030 159/73 mmHg - - 96  19  95 %  12/23/11 2015 172/78 mmHg - - 110  17  94 %  12/23/11 2000 180/82 mmHg 97.9 F (36.6 C) - 113  18  95 %  12/23/11 1333 144/82 mmHg 98.5 F (36.9 C) Oral 100  20  97 %  .  Recent laboratory studies: No results found.  Discharge Medications:   Medication List  As of 12/24/2011  1:06 PM   TAKE these medications         ADVAIR DISKUS 100-50 MCG/DOSE Aepb   Generic drug: Fluticasone-Salmeterol   Inhale 1 puff into the lungs daily as needed. For shortness of breath.  aspirin EC 81 MG tablet   Take 81 mg by mouth daily.      CALTRATE 600+D 600-400 MG-UNIT per tablet   Generic drug: Calcium Carbonate-Vitamin D   Take 2 tablets by mouth daily.      docusate sodium 100 MG capsule   Commonly known as: COLACE   Take 1 capsule (100 mg total) by mouth 2 (two) times daily.      Fiber Chew   Chew 2 tablets by mouth daily.      lansoprazole 30 MG capsule   Commonly known as: PREVACID   Take 30 mg by mouth daily as needed. For reflux.      losartan 25 MG tablet   Commonly known as: COZAAR   Take 25 mg by mouth daily.      methocarbamol 500 MG tablet   Commonly known as: ROBAXIN   Take 1 tablet (500 mg total) by mouth 4 (four) times daily.      multivitamin tablet   Take 1 tablet by mouth daily.      ondansetron 4 MG tablet   Commonly known as: ZOFRAN   Take 1  tablet (4 mg total) by mouth every 8 (eight) hours as needed for nausea.      oxyCODONE-acetaminophen 5-325 MG per tablet   Commonly known as: PERCOCET   Take 1 tablet by mouth every 4 (four) hours as needed for pain.      vitamin E 400 UNIT capsule   Generic drug: vitamin E   Take 400 Units by mouth daily.            Diagnostic Studies: No results found.  They benefited maximally from their hospital stay and there were no complications.     Disposition: 01-Home or Self Care Discharge Orders    Future Appointments: Provider: Department: Dept Phone: Center:   11/30/2012 8:30 AM Romero Belling, MD [facility] [phone] [facility]     Follow-up Information    Follow up with Sharma Covert, MD in 12 days.   Contact information:   [facility] [address] [place] [address] [phone]           Signed: Sharma Covert 12/24/2011, 1:06 PM

## 2011-12-24 NOTE — Op Note (Signed)
Laura [name], Laura [name]              ACCOUNT NO.:  [id number]  MEDICAL RECORD NO.:  [id number]  LOCATION:  5150                         FACILITY:  [facility]  PHYSICIAN:  Madelynn Done, MD  DATE OF BIRTH:  [birth year]  DATE OF PROCEDURE:  12/23/2011 DATE OF DISCHARGE:                              OPERATIVE REPORT   PREOPERATIVE DIAGNOSIS:  Left elbow Monteggia fracture dislocation.  POSTOPERATIVE DIAGNOSIS:  Left elbow Monteggia fracture dislocation.  ATTENDING PHYSICIAN:  Madelynn Done, MD, who was scrubbed and present for the entire procedure.  ASSISTANT SURGEON:  Dr. Fayrene Fearing who scrubbed and present for the key portions of procedure.  ANESTHESIA:  General via LMA.  First Designer, television/film set:  scrubbed and present for the key portions of procedure and who aided in reduction as well as assistance with the internal fixation.  SURGICAL PROCEDURE: 1. Open treatment of left elbow Monteggia fracture dislocation with     internal fixation of proximal olecranon and open treatment of the     proximal radius fracture with excision of small radial head     fragment 2. Radiographs 3 views, left elbow, stress radiography.  SURGICAL INDICATIONS:  Mrs. Plant is a 56 year old left-hand dominant female who sustained a fall, seen in [facility], was sent into the hospital with a fracture dislocation of her elbow.  The patient presented to the office with the fracture dislocation of the elbow.  It is recommended the patient undergo the above procedure.  Risks, benefits, and alternatives were discussed in detail with the patient and signed informed consent was obtained.  Risks include, but not limited to bleeding, infection, damage to nearby nerves, arteries, or tendons, nonunion, malunion, hardware failure, loss of motion of the wrist and digits, and need for further surgical intervention.  DESCRIPTION OF PROCEDURE:  The patient was properly identified in the preoperative holding area,  marked with a permanent marker, made on the left elbow to indicate correct operative site.  The patient was then brought back to the operating room and placed supine on the anesthesia room table, where the general anesthetic was administered.  The patient tolerated this well.  A well-padded tourniquet was then placed on left brachium and sealed with 1000 drape.  The left upper extremity was then prepped and draped in the normal sterile fashion.  Time-out was called, the correct side was identified, and procedure then begun.  The patient placed in a lazy lateral position.  All pressure points well padded. The arm was brought over the chest.  Curvilinear incision made directly over the olecranon tip, carried down to the site of the radial.  Second limb was then elevated and tourniquet insufflated.  Skin flaps dissection was carried down through the skin and subcutaneous tissue. The fascia over the olecranon was incised longitudinally and the fracture site was then exposed.  The patient did have gross disruption of the posterior radial ulnar joint with dislocation of the radial head posteriorly.  The capsulotomy was then lengthened proximally to expose the radial head.  The patient did have a very small fracture fragment of the radial head less than 20% of the articular surface.  It is very small piece that  was excised.  It was not amenable to internal fixation. Putting the elbow through a full range of motion.  There is good stability of the radial head.  I did not feel further treatment of the radial head fracture was indicated other than excision of the small fragment.  The attention was then turned to the proximal olecranon.  An open reduction was then held in place with the reduction clamp.  The DePuy proximal olecranon plate was then applied to the proximal olecranon and then held temporarily in place in K-wires. Its position was then confirmed using mini C-arm.  After adequate  position, 2 locking screws were then placed proximally and the bicortical screw. Compression screw was then placed distally.  Following this, the oblique screw was then placed proximally and then 2 more locking screws were then placed proximally. Further then finally 2 more bicortical screws were then placed distal to the fracture site.  After open treatment of the Monteggia fracture dislocation, stress radiography was then carried out.  There was good stability of the proximal radioulnar joint and radial capitellar joint.  There was no loose fragments within the joint that were visible.  Copious wound irrigation done throughout.  Following this, the capsule and fascia was then closed with a 0 Vicryl suture. The tourniquet deflated.  The skin and subcutaneous tissues closed with 2-0 and 4-0 Vicryl. The skin closed with 4-0 and a small skin staples. Xeroform dressing and sterile compressive bandage were then applied. The patient tolerated the procedure well.  The patient was then placed in a long-arm splint.  Extubated and taken recovery room in good condition.  RADIOGRAPHS:  Three views of the elbow do show the internal fixation in place.  There is good restoration of the ulnar humeral joint and radiocapitellar joint.  POSTOPERATIVE PLAN:  The patient admitted for IV antibiotics and pain control, discharged in the morning, seen back in the office in approximately 12 days for wound check, likely suture every other staple removal, get down to see the therapist to begin some gentle active range of motion.  Radiographs at each visit, three views of the elbow  and then begin some early active range of motion of the elbow.     Madelynn Done, MD     FWO/MEDQ  D:  12/23/2011  T:  12/24/2011  Job:  [id number]

## 2011-12-24 NOTE — Discharge Planning (Signed)
Patient discharged home in stable condition. Verbalizes understanding of all discharge instructions, including home medications and follow up appointments. 

## 2011-12-24 NOTE — Progress Notes (Signed)
Orthopedic Tech Progress Note Patient Details:  Laura [name] 02-12-1956 [id number]  Other Ortho Devices Ortho Device Location: kuzma sling Ortho Device Interventions: Application   Cammer, Mickie Bail 12/24/2011, 1:19 PM kuzma sling

## 2012-02-01 ENCOUNTER — Ambulatory Visit (HOSPITAL_COMMUNITY)
Admission: RE | Admit: 2012-02-01 | Payer: BC Managed Care – PPO | Source: Ambulatory Visit | Admitting: Orthopedic Surgery

## 2012-02-01 ENCOUNTER — Encounter (HOSPITAL_COMMUNITY): Admission: RE | Payer: Self-pay | Source: Ambulatory Visit

## 2012-02-01 SURGERY — ARTHROPLASTY, KNEE, TOTAL
Anesthesia: Choice | Site: Knee | Laterality: Right

## 2012-02-19 ENCOUNTER — Ambulatory Visit (INDEPENDENT_AMBULATORY_CARE_PROVIDER_SITE_OTHER): Payer: Worker's Compensation | Admitting: Internal Medicine

## 2012-02-19 ENCOUNTER — Encounter: Payer: Self-pay | Admitting: Internal Medicine

## 2012-02-19 VITALS — BP 110/78 | HR 82 | Temp 98.0°F | Ht 67.0 in | Wt 175.1 lb

## 2012-02-19 DIAGNOSIS — R7309 Other abnormal glucose: Secondary | ICD-10-CM

## 2012-02-19 DIAGNOSIS — I10 Essential (primary) hypertension: Secondary | ICD-10-CM

## 2012-02-19 DIAGNOSIS — R7302 Impaired glucose tolerance (oral): Secondary | ICD-10-CM

## 2012-02-19 DIAGNOSIS — H6690 Otitis media, unspecified, unspecified ear: Secondary | ICD-10-CM

## 2012-02-19 DIAGNOSIS — H6692 Otitis media, unspecified, left ear: Secondary | ICD-10-CM | POA: Insufficient documentation

## 2012-02-19 MED ORDER — LEVOFLOXACIN 250 MG PO TABS: 250.0000 mg | ORAL_TABLET | Freq: Every day | ORAL | Status: AC

## 2012-02-19 NOTE — Patient Instructions (Addendum)
Take all new medications as prescribed Continue all other medications as before Please have the pharmacy call with any refills you may need. You can also take Mucinex (or it's generic off brand) for congestion Please keep your appointments with your specialists as you have planned - orthopedic for your elbow

## 2012-02-20 ENCOUNTER — Encounter: Payer: Self-pay | Admitting: Internal Medicine

## 2012-02-20 NOTE — Assessment & Plan Note (Signed)
Mild to mod, for antibx course,  to f/u any worsening symptoms or concerns 

## 2012-02-20 NOTE — Progress Notes (Signed)
Subjective:    Patient ID: Laura [name], female    DOB: Jan 23, 1956, 56 y.o.   MRN: [id number]  HPI  Here with 3 days onset fever, HA, left ear pain without d/c or vertigo;  No ST, cough or sinus symtpoms; Pt denies chest pain, increased sob or doe, wheezing, orthopnea, PND, increased LE swelling, palpitations, dizziness or syncope.  Pt denies new neurological symptoms such as new headache, or facial or extremity weakness or numbness   Pt denies polydipsia, polyuria.   Pt denies fever, wt loss, night sweats, loss of appetite, or other constitutional symptoms, except for the above  Has hx of right ACL repair, and has been able to proceed with planned right knee TKR  Due to intervening fall with fx left elbow. Past Medical History  Diagnosis Date  . ESOPHAGEAL STRICTURE 02/17/2008  . ALLERGIC RHINITIS 01/25/2008  . GERD 01/25/2008  . ASTHMA 01/25/2008  . Unspecified disorder of liver 01/19/2008  . HYPERGLYCEMIA 01/19/2008  . RECTAL BLEEDING 01/04/2008  . 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 METATARSALGIA 11/08/2007  . Tibialis tendinitis 05/13/2010  . CONSTIPATION 02/21/2008  . VENTRICULAR HYPERTROPHY, LEFT 09/23/2010  . HYPERTENSION 08/05/2010  . ABNORMAL ELECTROCARDIOGRAM 08/05/2010  . ACNE, MILD 02/17/2008  . Impaired glucose tolerance 01/11/2011  . PONV (postoperative nausea and vomiting)    Past Surgical History  Procedure Date  . Cholecystectomy   . Esophagogastroduodenoscopy 04/01/1998  . Nasal sinus surgery     s/p  . Ovarian cyst removal   . Arthroscopic repair acl 10/1998    Left knee   . US echocardiography 2011  . Cardiovascular stress test 2011  . Orif elbow fracture 12/23/2011    Procedure: OPEN REDUCTION INTERNAL FIXATION (ORIF) ELBOW/OLECRANON FRACTURE;  Surgeon: Sharma Covert, MD;  Location: [facility];  Service: Orthopedics;  Laterality: Left;  LEFT ELBOW ORIF OF PROXIMAL OLECRANON AND RADIAL HEAD, POSSIBLE ARTHROPLASTY    reports that she has never smoked. She does not  have any smokeless tobacco history on file. She reports that she does not drink alcohol or use illicit drugs. family history includes Cancer in her other; Diabetes in her mother; and Thyroid disease in her mother. No Known Allergies Current Outpatient Prescriptions on File Prior to Visit  Medication Sig Dispense Refill  . aspirin EC 81 MG tablet Take 81 mg by mouth daily.      . Calcium Carbonate-Vitamin D (CALTRATE 600+D) 600-400 MG-UNIT per tablet Take 2 tablets by mouth daily.       . Fiber CHEW Chew 2 tablets by mouth daily.      . Fluticasone-Salmeterol (ADVAIR DISKUS) 100-50 MCG/DOSE AEPB Inhale 1 puff into the lungs daily as needed. For shortness of breath.      . lansoprazole (PREVACID) 30 MG capsule Take 30 mg by mouth daily as needed. For reflux.      Marland Kitchen losartan (COZAAR) 25 MG tablet Take 25 mg by mouth daily.      . Multiple Vitamin (MULTIVITAMIN) tablet Take 1 tablet by mouth daily.        . vitamin E (VITAMIN E) 400 UNIT capsule Take 400 Units by mouth daily.        Marland Kitchen DISCONTD: olmesartan (BENICAR) 20 MG tablet Take 20 mg by mouth daily.         Review of Systems Review of Systems  Constitutional: Negative for diaphoresis and unexpected weight change.  HENT: Negative for drooling and tinnitus.   Eyes: Negative for photophobia and visual  disturbance.  Respiratory: Negative for choking and stridor.   Gastrointestinal: Negative for vomiting and blood in stool.  Genitourinary: Negative for hematuria and decreased urine volume.  Psychiatric/Behavioral: Negative for decreased concentration. The patient is not hyperactive.      Objective:   Physical Exam BP 110/78  Pulse 82  Temp(Src) 98 F (36.7 C) (Oral)  Ht 5\' 7"  (1.702 m)  Wt 175 lb 2 oz (79.436 kg)  BMI 27.43 kg/m2  SpO2 97% Physical Exam  VS noted, mild ill Constitutional: Pt appears well-developed and well-nourished.  HENT: Head: Normocephalic.  Right Ear: External ear normal.  Left Ear: External ear normal.    Bilat tm's mild erythema left much more than right, with bulging.  Sinus nontender.  Pharynx mild erythema Eyes: Conjunctivae and EOM are normal. Pupils are equal, round, and reactive to light.  Neck: Normal range of motion. Neck supple.  Cardiovascular: Normal rate and regular rhythm.   Pulmonary/Chest: Effort normal and breath sounds normal.  Neurological: Pt is alert. Not confused Skin: Skin is warm. No erythema.  Psychiatric: Pt behavior is normal. Thought content normal.     Assessment & Plan:

## 2012-02-20 NOTE — Assessment & Plan Note (Signed)
stable overall by hx and exam, most recent data reviewed with pt, and pt to continue medical treatment as before BP Readings from Last 3 Encounters:  02/19/12 110/78  12/24/11 128/70  12/24/11 128/70

## 2012-02-20 NOTE — Assessment & Plan Note (Signed)
stable overall by hx and exam, most recent data reviewed with pt, and pt to continue medical treatment as before Lab Results  Component Value Date   HGBA1C 5.8 11/30/2011

## 2012-05-25 ENCOUNTER — Ambulatory Visit (INDEPENDENT_AMBULATORY_CARE_PROVIDER_SITE_OTHER): Payer: BC Managed Care – PPO | Admitting: Endocrinology

## 2012-05-25 ENCOUNTER — Encounter: Payer: Self-pay | Admitting: Endocrinology

## 2012-05-25 ENCOUNTER — Ambulatory Visit: Payer: Worker's Compensation | Admitting: Internal Medicine

## 2012-05-25 VITALS — BP 138/82 | HR 109 | Temp 100.7°F | Ht 67.0 in | Wt 175.0 lb

## 2012-05-25 DIAGNOSIS — J069 Acute upper respiratory infection, unspecified: Secondary | ICD-10-CM

## 2012-05-25 MED ORDER — PROMETHAZINE-DM 6.25-15 MG/5ML PO SYRP: 5.0000 mL | ORAL_SOLUTION | Freq: Four times a day (QID) | ORAL | Status: DC | PRN

## 2012-05-25 MED ORDER — CEFUROXIME AXETIL 250 MG PO TABS: 250.0000 mg | ORAL_TABLET | Freq: Two times a day (BID) | ORAL | Status: AC

## 2012-05-25 NOTE — Progress Notes (Signed)
Subjective:    Patient ID: Laura [name], female    DOB: 02-Oct-1955, 56 y.o.   MRN: [id number]  HPI Pt states few days of moderate pain at the throat, and assoc dry-quality cough Past Medical History  Diagnosis Date  . ESOPHAGEAL STRICTURE 02/17/2008  . ALLERGIC RHINITIS 01/25/2008  . GERD 01/25/2008  . ASTHMA 01/25/2008  . Unspecified disorder of liver 01/19/2008  . HYPERGLYCEMIA 01/19/2008  . RECTAL BLEEDING 01/04/2008  . 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 METATARSALGIA 11/08/2007  . Tibialis tendinitis 05/13/2010  . CONSTIPATION 02/21/2008  . VENTRICULAR HYPERTROPHY, LEFT 09/23/2010  . HYPERTENSION 08/05/2010  . ABNORMAL ELECTROCARDIOGRAM 08/05/2010  . ACNE, MILD 02/17/2008  . Impaired glucose tolerance 01/11/2011  . PONV (postoperative nausea and vomiting)     Past Surgical History  Procedure Date  . Cholecystectomy   . Esophagogastroduodenoscopy 04/01/1998  . Nasal sinus surgery     s/p  . Ovarian cyst removal   . Arthroscopic repair acl 10/1998    Left knee   . US echocardiography 2011  . Cardiovascular stress test 2011  . Orif elbow fracture 12/23/2011    Procedure: OPEN REDUCTION INTERNAL FIXATION (ORIF) ELBOW/OLECRANON FRACTURE;  Surgeon: Sharma Covert, MD;  Location: [facility];  Service: Orthopedics;  Laterality: Left;  LEFT ELBOW ORIF OF PROXIMAL OLECRANON AND RADIAL HEAD, POSSIBLE ARTHROPLASTY    History   Social History  . Marital Status: Married    Spouse Name: N/A    Number of Children: N/A  . Years of Education: N/A   Occupational History  . Not on file.   Social History Main Topics  . Smoking status: Never Smoker   . Smokeless tobacco: Not on file  . Alcohol Use: No  . Drug Use: No  . Sexually Active:    Other Topics Concern  . Not on file   Social History Narrative   Pt does not get regular exercise.Daily Caffeine Use (12 oz Coke Zero per day)    Current Outpatient Prescriptions on File Prior to Visit  Medication Sig Dispense Refill  .  aspirin EC 81 MG tablet Take 81 mg by mouth daily.      . Calcium Carbonate-Vitamin D (CALTRATE 600+D) 600-400 MG-UNIT per tablet Take 2 tablets by mouth daily.       . Fiber CHEW Chew 2 tablets by mouth daily.      . Fluticasone-Salmeterol (ADVAIR DISKUS) 100-50 MCG/DOSE AEPB Inhale 1 puff into the lungs daily as needed. For shortness of breath.      . lansoprazole (PREVACID) 30 MG capsule Take 30 mg by mouth daily as needed. For reflux.      Marland Kitchen losartan (COZAAR) 25 MG tablet Take 25 mg by mouth daily.      . Multiple Vitamin (MULTIVITAMIN) tablet Take 1 tablet by mouth daily.        . vitamin E (VITAMIN E) 400 UNIT capsule Take 400 Units by mouth daily.        Marland Kitchen DISCONTD: olmesartan (BENICAR) 20 MG tablet Take 20 mg by mouth daily.          No Known Allergies  Family History  Problem Relation Age of Onset  . Diabetes Mother   . Thyroid disease Mother     hypothyroidism  . Cancer Other     Pancreatic Cancer-Aunt    BP 138/82  Pulse 109  Temp 100.7 F (38.2 C) (Oral)  Ht 5\' 7"  (1.702 m)  Wt 175 lb (79.379 kg)  BMI 27.41 kg/m2  SpO2 98%    Review of Systems She has fever, nausea and bilat otalgia    Objective:   Physical Exam VITAL SIGNS:  See vs page GENERAL: no distress head: no deformity eyes: no periorbital swelling, no proptosis external nose and ears are normal.  mouth: no lesion seen, but pharynx is red. Both tm's are slightly red. LUNGS:  Clear to auscultation.  ABDOMEN: abdomen is soft, nontender.  no hepatosplenomegaly.  not distended.  no hernia      Assessment & Plan:  URI, new

## 2012-05-25 NOTE — Patient Instructions (Addendum)
i have sent 2 prescriptions to your pharmacy: antibiotic, and cough syrup Loratadine-d (non-prescription) will help your congestion. I hope you feel better soon.  If you don't feel better by next week, please call back.

## 2012-05-27 ENCOUNTER — Telehealth: Payer: Self-pay | Admitting: Endocrinology

## 2012-05-27 NOTE — Telephone Encounter (Signed)
Caller: Laura [name]/Patient; Patient Name: Laura [name]; PCP: Romero Belling (Adults only); Best Callback Phone Number: [phone] Onset 05/27/12 seen in office 05/25/12 for fever and cough; cough medicine, Promethazine-DM is not suppressing cough, wanting Rx to suppress as she has coughed so much her ribs are sore and she is not able to rest. Afebrile. Denies any new or worsening signs/symptoms or need for triage. RN instructed an Rx could not be called in, would need to be evaluated. Caller states she will call the office.

## 2012-05-30 MED ORDER — PROMETHAZINE-CODEINE 6.25-10 MG/5ML PO SYRP: 5.0000 mL | ORAL_SOLUTION | ORAL | Status: AC | PRN

## 2012-05-30 NOTE — Telephone Encounter (Signed)
Please verify no fever or sob i printed rx 

## 2012-05-30 NOTE — Telephone Encounter (Signed)
Pt is not having any fever or SOB, rx faxed to [facility].

## 2012-09-14 HISTORY — PX: CARPAL TUNNEL RELEASE: SHX101

## 2012-09-14 HISTORY — PX: ELBOW ARTHROPLASTY: SHX928

## 2012-11-30 ENCOUNTER — Encounter: Payer: BC Managed Care – PPO | Admitting: Endocrinology

## 2012-12-22 ENCOUNTER — Other Ambulatory Visit: Payer: Self-pay | Admitting: *Deleted

## 2012-12-22 MED ORDER — LOSARTAN POTASSIUM 25 MG PO TABS: 25.0000 mg | ORAL_TABLET | Freq: Every day | ORAL | Status: DC

## 2012-12-23 ENCOUNTER — Other Ambulatory Visit: Payer: Self-pay | Admitting: *Deleted

## 2012-12-23 MED ORDER — LOSARTAN POTASSIUM 25 MG PO TABS: 25.0000 mg | ORAL_TABLET | Freq: Every day | ORAL | Status: DC

## 2012-12-26 ENCOUNTER — Telehealth: Payer: Self-pay | Admitting: Endocrinology

## 2012-12-26 MED ORDER — LOSARTAN POTASSIUM 25 MG PO TABS: 25.0000 mg | ORAL_TABLET | Freq: Every day | ORAL | Status: DC

## 2012-12-26 NOTE — Telephone Encounter (Signed)
LUSARTIN 25MG , NEEDS 90 DAY SUPPLY CALLED IN AT [facility]

## 2013-01-25 ENCOUNTER — Other Ambulatory Visit: Payer: Self-pay | Admitting: *Deleted

## 2013-01-25 ENCOUNTER — Telehealth: Payer: Self-pay

## 2013-01-25 MED ORDER — LOSARTAN POTASSIUM 25 MG PO TABS: 25.0000 mg | ORAL_TABLET | Freq: Every day | ORAL | Status: DC

## 2013-01-25 NOTE — Telephone Encounter (Signed)
Pt needs refill of her bp med.  Didn't realize she was almost out.

## 2013-01-26 NOTE — Telephone Encounter (Signed)
loasrtan was filled on 5/14

## 2013-01-26 NOTE — Telephone Encounter (Signed)
This is not my patient.  Sure that this message wasn't for another physician?

## 2013-02-17 ENCOUNTER — Encounter: Payer: BC Managed Care – PPO | Admitting: Endocrinology

## 2013-04-24 ENCOUNTER — Other Ambulatory Visit: Payer: Self-pay

## 2013-04-24 MED ORDER — LOSARTAN POTASSIUM 25 MG PO TABS: 25.0000 mg | ORAL_TABLET | Freq: Every day | ORAL | Status: DC

## 2013-05-16 ENCOUNTER — Other Ambulatory Visit: Payer: Self-pay | Admitting: *Deleted

## 2013-05-16 MED ORDER — LOSARTAN POTASSIUM 25 MG PO TABS: 25.0000 mg | ORAL_TABLET | Freq: Every day | ORAL | Status: DC

## 2013-05-18 ENCOUNTER — Telehealth: Payer: Self-pay | Admitting: Endocrinology

## 2013-05-18 ENCOUNTER — Encounter: Payer: Self-pay | Admitting: Endocrinology

## 2013-05-18 ENCOUNTER — Ambulatory Visit (INDEPENDENT_AMBULATORY_CARE_PROVIDER_SITE_OTHER): Payer: BC Managed Care – PPO | Admitting: Endocrinology

## 2013-05-18 VITALS — BP 132/70 | HR 75 | Ht 67.0 in | Wt 179.0 lb

## 2013-05-18 DIAGNOSIS — E7800 Pure hypercholesterolemia, unspecified: Secondary | ICD-10-CM

## 2013-05-18 DIAGNOSIS — K769 Liver disease, unspecified: Secondary | ICD-10-CM

## 2013-05-18 DIAGNOSIS — N951 Menopausal and female climacteric states: Secondary | ICD-10-CM | POA: Insufficient documentation

## 2013-05-18 DIAGNOSIS — Z0000 Encounter for general adult medical examination without abnormal findings: Secondary | ICD-10-CM

## 2013-05-18 DIAGNOSIS — I10 Essential (primary) hypertension: Secondary | ICD-10-CM

## 2013-05-18 DIAGNOSIS — R7302 Impaired glucose tolerance (oral): Secondary | ICD-10-CM

## 2013-05-18 DIAGNOSIS — R7309 Other abnormal glucose: Secondary | ICD-10-CM

## 2013-05-18 LAB — LIPID PANEL
Cholesterol: 223 mg/dL — ABNORMAL HIGH (ref 0–200)
Total CHOL/HDL Ratio: 6
Triglycerides: 697 mg/dL — ABNORMAL HIGH (ref 0.0–149.0)
VLDL: 139.4 mg/dL — ABNORMAL HIGH (ref 0.0–40.0)

## 2013-05-18 LAB — URINALYSIS, ROUTINE W REFLEX MICROSCOPIC
Hgb urine dipstick: NEGATIVE
Ketones, ur: NEGATIVE
Urine Glucose: NEGATIVE
Urobilinogen, UA: 0.2 (ref 0.0–1.0)

## 2013-05-18 LAB — CBC WITH DIFFERENTIAL/PLATELET
Basophils Relative: 0.4 % (ref 0.0–3.0)
Eosinophils Absolute: 0.1 10*3/uL (ref 0.0–0.7)
MCHC: 34.3 g/dL (ref 30.0–36.0)
MCV: 87 fl (ref 78.0–100.0)
Monocytes Absolute: 0.5 10*3/uL (ref 0.1–1.0)
Neutro Abs: 3.2 10*3/uL (ref 1.4–7.7)
Neutrophils Relative %: 39.3 % — ABNORMAL LOW (ref 43.0–77.0)
RBC: 4.77 Mil/uL (ref 3.87–5.11)

## 2013-05-18 LAB — HEPATIC FUNCTION PANEL
ALT: 43 U/L — ABNORMAL HIGH (ref 0–35)
Alkaline Phosphatase: 101 U/L (ref 39–117)
Bilirubin, Direct: 0 mg/dL (ref 0.0–0.3)
Total Bilirubin: 0.4 mg/dL (ref 0.3–1.2)

## 2013-05-18 LAB — HEMOGLOBIN A1C: Hgb A1c MFr Bld: 5.8 % (ref 4.6–6.5)

## 2013-05-18 LAB — BASIC METABOLIC PANEL
Calcium: 9.4 mg/dL (ref 8.4–10.5)
Creatinine, Ser: 0.5 mg/dL (ref 0.4–1.2)

## 2013-05-18 LAB — TSH: TSH: 0.99 u[IU]/mL (ref 0.35–5.50)

## 2013-05-18 MED ORDER — LOSARTAN POTASSIUM 25 MG PO TABS: 25.0000 mg | ORAL_TABLET | Freq: Every day | ORAL | Status: DC

## 2013-05-18 NOTE — Patient Instructions (Addendum)
please consider these measures for your health:  minimize alcohol.  do not use tobacco products.  have a colonoscopy at least every 10 years from age 56.  Women should have an annual mammogram from age 40.  keep firearms safely stored.  always use seat belts.  have working smoke alarms in your home.  see an eye doctor and dentist regularly.  never drive under the influence of alcohol or drugs (including prescription drugs).  those with fair skin should take precautions against the sun. blood tests are being requested for you today.  We'll contact you with results. Please return in 1 year.  

## 2013-05-18 NOTE — Telephone Encounter (Signed)
Pt advised appt on 05/23/13 at 10:30

## 2013-05-18 NOTE — Telephone Encounter (Signed)
Pt needs to be scheduled for bone density scan / Sherri S.

## 2013-05-18 NOTE — Progress Notes (Signed)
Subjective:    Patient ID: Laura [name], female    DOB: 1956-06-30, 57 y.o.   MRN: [id number]  HPI Pt is here for regular wellness examination, and is feeling pretty well in general, and says chronic med probs are stable, except as noted below Past Medical History  Diagnosis Date  . ESOPHAGEAL STRICTURE 02/17/2008  . ALLERGIC RHINITIS 01/25/2008  . GERD 01/25/2008  . ASTHMA 01/25/2008  . Unspecified disorder of liver 01/19/2008  . HYPERGLYCEMIA 01/19/2008  . RECTAL BLEEDING 01/04/2008  . 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 METATARSALGIA 11/08/2007  . Tibialis tendinitis 05/13/2010  . CONSTIPATION 02/21/2008  . VENTRICULAR HYPERTROPHY, LEFT 09/23/2010  . HYPERTENSION 08/05/2010  . ABNORMAL ELECTROCARDIOGRAM 08/05/2010  . ACNE, MILD 02/17/2008  . Impaired glucose tolerance 01/11/2011  . PONV (postoperative nausea and vomiting)     Past Surgical History  Procedure Laterality Date  . Cholecystectomy    . Esophagogastroduodenoscopy  04/01/1998  . Nasal sinus surgery      s/p  . Ovarian cyst removal    . Arthroscopic repair acl  10/1998    Left knee   . US echocardiography  2011  . Cardiovascular stress test  2011  . Orif elbow fracture  12/23/2011    Procedure: OPEN REDUCTION INTERNAL FIXATION (ORIF) ELBOW/OLECRANON FRACTURE;  Surgeon: Sharma Covert, MD;  Location: [facility] OR;  Service: Orthopedics;  Laterality: Left;  LEFT ELBOW ORIF OF PROXIMAL OLECRANON AND RADIAL HEAD, POSSIBLE ARTHROPLASTY    History   Social History  . Marital Status: Married    Spouse Name: N/A    Number of Children: N/A  . Years of Education: N/A   Occupational History  . Not on file.   Social History Main Topics  . Smoking status: Never Smoker   . Smokeless tobacco: Not on file  . Alcohol Use: No  . Drug Use: No  . Sexual Activity:    Other Topics Concern  . Not on file   Social History Narrative   Pt does not get regular exercise.   Daily Caffeine Use (12 oz Coke Zero per day)     Current Outpatient Prescriptions on File Prior to Visit  Medication Sig Dispense Refill  . aspirin EC 81 MG tablet Take 81 mg by mouth daily.      . Calcium Carbonate-Vitamin D (CALTRATE 600+D) 600-400 MG-UNIT per tablet Take 2 tablets by mouth daily.       . Fiber CHEW Chew 2 tablets by mouth daily.      . Fluticasone-Salmeterol (ADVAIR DISKUS) 100-50 MCG/DOSE AEPB Inhale 1 puff into the lungs daily as needed. For shortness of breath.      . lansoprazole (PREVACID) 30 MG capsule Take 30 mg by mouth daily as needed. For reflux.      . Multiple Vitamin (MULTIVITAMIN) tablet Take 1 tablet by mouth daily.        . vitamin E (VITAMIN E) 400 UNIT capsule Take 400 Units by mouth daily.        . [DISCONTINUED] olmesartan (BENICAR) 20 MG tablet Take 20 mg by mouth daily.         No current facility-administered medications on file prior to visit.    No Known Allergies  Family History  Problem Relation Age of Onset  . Diabetes Mother   . Thyroid disease Mother     hypothyroidism  . Cancer Other     Pancreatic Cancer-Aunt    BP 132/70  Pulse 75  Ht 5'  7" (1.702 m)  Wt 179 lb (81.194 kg)  BMI 28.03 kg/m2  SpO2 97%     Review of Systems  Constitutional: Negative for fever.       She has weight gain  HENT: Negative for hearing loss.   Eyes: Negative for visual disturbance.  Respiratory: Negative for shortness of breath.   Gastrointestinal: Negative for blood in stool.  Endocrine: Negative for cold intolerance.  Genitourinary: Negative for hematuria.  Musculoskeletal: Negative for back pain.       LUE pain--recent surgery  Skin: Negative for rash.  Allergic/Immunologic: Positive for environmental allergies.  Neurological: Negative for syncope.  Hematological: Does not bruise/bleed easily.  Psychiatric/Behavioral: Negative for dysphoric mood.       Objective:   Physical Exam VS: see vs page GEN: no distress HEAD: head: no deformity eyes: no periorbital swelling, no  proptosis external nose and ears are normal mouth: no lesion seen NECK: supple, thyroid is not enlarged CHEST WALL: no deformity LUNGS:  Clear to auscultation BREASTS:  sees gyn ABD: abdomen is soft, nontender.  no hepatosplenomegaly.  not distended.  no hernia GENITALIA/RECTAL: sees gyn MUSCULOSKELETAL: muscle bulk and strength are grossly normal.  no obvious joint swelling.  gait is normal and steady EXTEMITIES: no deformity.  no ulcer on the feet.  feet are of normal color and temp.  no edema PULSES: dorsalis pedis intact bilat.  no carotid bruit NEURO:  cn 2-12 grossly intact.   readily moves all 4's.  sensation is intact to touch on the feet SKIN:  Normal texture and temperature.  No rash or suspicious lesion is visible.   NODES:  None palpable at the neck PSYCH: alert, oriented x3.  Does not appear anxious nor depressed.     i reviewed electrocardiogram: no significant change.      Assessment & Plan:  Wellness visit today, with problems stable, except as noted.     SEPARATE EVALUATION FOLLOWS--EACH PROBLEM HERE IS NEW, NOT RESPONDING TO TREATMENT, OR POSES SIGNIFICANT RISK TO THE PATIENT'S HEALTH: HISTORY OF THE PRESENT ILLNESS: elev hepatic transaminases are again noted.  She has weight gain elev triglycerides are again noted.  She denies numbness.   PAST MEDICAL HISTORY reviewed and up to date today REVIEW OF SYSTEMS: She denies chest pain PHYSICAL EXAMINATION: VITAL SIGNS:  See vs page GENERAL: no distress CV: reg rate and rhythm, no murmur LAB/XRAY RESULTS: Lab Results  Component Value Date   WBC 8.1 05/18/2013   HGB 14.2 05/18/2013   HCT 41.5 05/18/2013   PLT 308.0 05/18/2013   GLUCOSE 95 05/18/2013   CHOL 223* 05/18/2013   TRIG 697.0 Triglyceride is over 400; calculations on Lipids are invalid.* 05/18/2013   HDL 37.30* 05/18/2013   LDLDIRECT 84.0 05/18/2013   LDLCALC 59 11/30/2011   ALT 43* 05/18/2013   AST 32 05/18/2013   NA 138 05/18/2013   K 3.4* 05/18/2013   CL 101  05/18/2013   CREATININE 0.5 05/18/2013   BUN 12 05/18/2013   CO2 25 05/18/2013   TSH 0.99 05/18/2013   HGBA1C 5.8 05/18/2013  IMPRESSION: elev hepatic transaminases, recurrent Hypertriglyceridemia, persistent PLAN: See instruction page

## 2013-05-23 ENCOUNTER — Ambulatory Visit (INDEPENDENT_AMBULATORY_CARE_PROVIDER_SITE_OTHER)
Admission: RE | Admit: 2013-05-23 | Discharge: 2013-05-23 | Disposition: A | Discharge status: Home / Self Care | Payer: BC Managed Care – PPO | Source: Ambulatory Visit | Attending: Endocrinology | Admitting: Endocrinology

## 2013-05-23 DIAGNOSIS — N951 Menopausal and female climacteric states: Secondary | ICD-10-CM

## 2013-05-29 ENCOUNTER — Telehealth: Payer: Self-pay

## 2013-05-29 MED ORDER — ALENDRONATE SODIUM 70 MG PO TABS: 70.0000 mg | ORAL_TABLET | ORAL | Status: DC

## 2013-05-29 NOTE — Telephone Encounter (Signed)
Left message

## 2013-05-29 NOTE — Telephone Encounter (Signed)
Yes, you should Please note that this pill could worsen your heartburn If so, please let me know

## 2013-05-29 NOTE — Telephone Encounter (Signed)
Pt would like to know if she should continue taking her Calcitriol along with the rx that you are sending in for osteoporosis? [phone]

## 2013-06-06 ENCOUNTER — Other Ambulatory Visit: Payer: Self-pay

## 2013-06-22 ENCOUNTER — Other Ambulatory Visit: Payer: Self-pay

## 2013-06-22 ENCOUNTER — Other Ambulatory Visit: Payer: BC Managed Care – PPO

## 2013-06-22 ENCOUNTER — Telehealth: Payer: Self-pay | Admitting: *Deleted

## 2013-06-22 ENCOUNTER — Other Ambulatory Visit: Payer: Self-pay | Admitting: Dermatology

## 2013-06-22 DIAGNOSIS — K769 Liver disease, unspecified: Secondary | ICD-10-CM

## 2013-06-22 LAB — HEPATITIS C ANTIBODY: HCV Ab: NEGATIVE

## 2013-06-22 MED ORDER — MECLIZINE HCL 12.5 MG PO TABS: 12.5000 mg | ORAL_TABLET | Freq: Three times a day (TID) | ORAL | Status: DC | PRN

## 2013-06-22 NOTE — Telephone Encounter (Signed)
Pt called asking/requesting for Dr Everardo All to refill pt's rx for Antivert. Pt states she has had some dizziness and needs a refill. Please advise.

## 2013-06-22 NOTE — Telephone Encounter (Signed)
done

## 2013-09-14 HISTORY — PX: BREAST EXCISIONAL BIOPSY: SUR124

## 2013-10-18 ENCOUNTER — Ambulatory Visit: Payer: BC Managed Care – PPO | Admitting: Endocrinology

## 2013-10-19 ENCOUNTER — Ambulatory Visit: Payer: BC Managed Care – PPO | Admitting: Endocrinology

## 2013-10-20 ENCOUNTER — Other Ambulatory Visit: Payer: Self-pay | Admitting: Dermatology

## 2013-10-24 ENCOUNTER — Telehealth: Payer: Self-pay | Admitting: Endocrinology

## 2013-10-24 NOTE — Telephone Encounter (Signed)
please call patient: Ov would be needed for a medical clearance.

## 2013-10-25 NOTE — Telephone Encounter (Signed)
Pt informed and coming in for visit on 2/13.

## 2013-10-27 ENCOUNTER — Ambulatory Visit: Payer: BC Managed Care – PPO | Admitting: Endocrinology

## 2013-10-30 ENCOUNTER — Encounter: Payer: Self-pay | Admitting: Endocrinology

## 2013-10-30 ENCOUNTER — Ambulatory Visit (INDEPENDENT_AMBULATORY_CARE_PROVIDER_SITE_OTHER): Payer: BC Managed Care – PPO | Admitting: Endocrinology

## 2013-10-30 VITALS — BP 122/84 | HR 62 | Temp 97.8°F | Ht 67.0 in | Wt 183.0 lb

## 2013-10-30 DIAGNOSIS — M810 Age-related osteoporosis without current pathological fracture: Secondary | ICD-10-CM

## 2013-10-30 NOTE — Progress Notes (Signed)
Subjective:    Patient ID: Laura [name], female    DOB: Nov 28, 1955, 58 y.o.   MRN: [id number]  HPI Pt has had HTN since 2011.  She tolerates cozaar well.  She injured her left elbow at work in April of 2013.  She was ref here for HTN, for clearance for her functional evaluation.   Denies chest pain and sob.   Past Medical History  Diagnosis Date  . ESOPHAGEAL STRICTURE 02/17/2008  . ALLERGIC RHINITIS 01/25/2008  . GERD 01/25/2008  . ASTHMA 01/25/2008  . Unspecified disorder of liver 01/19/2008  . HYPERGLYCEMIA 01/19/2008  . RECTAL BLEEDING 01/04/2008  . 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 METATARSALGIA 11/08/2007  . Tibialis tendinitis 05/13/2010  . CONSTIPATION 02/21/2008  . VENTRICULAR HYPERTROPHY, LEFT 09/23/2010  . HYPERTENSION 08/05/2010  . ABNORMAL ELECTROCARDIOGRAM 08/05/2010  . ACNE, MILD 02/17/2008  . Impaired glucose tolerance 01/11/2011  . PONV (postoperative nausea and vomiting)     Past Surgical History  Procedure Laterality Date  . Cholecystectomy    . Esophagogastroduodenoscopy  04/01/1998  . Nasal sinus surgery      s/p  . Ovarian cyst removal    . Arthroscopic repair acl  10/1998    Left knee   . US echocardiography  2011  . Cardiovascular stress test  2011  . Orif elbow fracture  12/23/2011    Procedure: OPEN REDUCTION INTERNAL FIXATION (ORIF) ELBOW/OLECRANON FRACTURE;  Surgeon: Linna Hoff, MD;  Location: [place];  Service: Orthopedics;  Laterality: Left;  LEFT ELBOW ORIF OF PROXIMAL OLECRANON AND RADIAL HEAD, POSSIBLE ARTHROPLASTY    History   Social History  . Marital Status: Married    Spouse Name: N/A    Number of Children: N/A  . Years of Education: N/A   Occupational History  . Not on file.   Social History Main Topics  . Smoking status: Never Smoker   . Smokeless tobacco: Not on file  . Alcohol Use: No  . Drug Use: No  . Sexual Activity:    Other Topics Concern  . Not on file   Social History Narrative   Pt does not get regular  exercise.   Daily Caffeine Use (12 oz Coke Zero per day)    Current Outpatient Prescriptions on File Prior to Visit  Medication Sig Dispense Refill  . alendronate (FOSAMAX) 70 MG tablet Take 1 tablet (70 mg total) by mouth every 7 (seven) days. Take with a full glass of water on an empty stomach.  4 tablet  11  . aspirin EC 81 MG tablet Take 81 mg by mouth daily.      . Calcium Carbonate-Vitamin D (CALTRATE 600+D) 600-400 MG-UNIT per tablet Take 2 tablets by mouth daily.       . Fiber CHEW Chew 2 tablets by mouth daily.      . Fluticasone-Salmeterol (ADVAIR DISKUS) 100-50 MCG/DOSE AEPB Inhale 1 puff into the lungs daily as needed. For shortness of breath.      . lansoprazole (PREVACID) 30 MG capsule Take 30 mg by mouth daily as needed. For reflux.      Marland Kitchen losartan (COZAAR) 25 MG tablet Take 1 tablet (25 mg total) by mouth daily.  90 tablet  3  . meclizine (ANTIVERT) 12.5 MG tablet Take 1 tablet (12.5 mg total) by mouth 3 (three) times daily as needed for dizziness.  30 tablet  4  . Multiple Vitamin (MULTIVITAMIN) tablet Take 1 tablet by mouth daily.        Marland Kitchen  vitamin E (VITAMIN E) 400 UNIT capsule Take 400 Units by mouth daily.        . [DISCONTINUED] olmesartan (BENICAR) 20 MG tablet Take 20 mg by mouth daily.         No current facility-administered medications on file prior to visit.    No Known Allergies  Family History  Problem Relation Age of Onset  . Diabetes Mother   . Thyroid disease Mother     hypothyroidism  . Cancer Other     Pancreatic Cancer-Aunt   BP 122/84  Pulse 62  Temp(Src) 97.8 F (36.6 C) (Oral)  Ht 5\' 7"  (1.702 m)  Wt 183 lb (83.008 kg)  BMI 28.66 kg/m2  SpO2 97%  Review of Systems Denies dizziness.     Objective:   Physical Exam VITAL SIGNS:  See vs page GENERAL: no distress LUNGS:  Clear to auscultation. HEART:  Regular rate and rhythm without murmurs noted. Normal S1,S2.   Ext: no edema.       Assessment & Plan:  HTN: well-controlled.

## 2013-10-30 NOTE — Patient Instructions (Addendum)
i'll sign the form for [facility].     Calorie Counting Diet A calorie counting diet requires you to eat the number of calories that are right for you in a day. Calories are the measurement of how much energy you get from the food you eat. Eating the right amount of calories is important for staying at a healthy weight. If you eat too many calories, your body will store them as fat and you may gain weight. If you eat too few calories, you may lose weight. Counting the number of calories you eat during a day will help you know if you are eating the right amount. A Registered Dietitian can determine how many calories you need in a day. The amount of calories needed varies from person to person. If your goal is to lose weight, you will need to eat fewer calories. Losing weight can benefit you if you are overweight or have health problems such as heart disease, high blood pressure, or diabetes. If your goal is to gain weight, you will need to eat more calories. Gaining weight may be necessary if you have a certain health problem that causes your body to need more energy. TIPS Whether you are increasing or decreasing the number of calories you eat during a day, it may be hard to get used to changes in what you eat and drink. The following are tips to help you keep track of the number of calories you eat.  Measure foods at home with measuring cups. This helps you know the amount of food and number of calories you are eating.  Restaurants often serve food in amounts that are larger than 1 serving. While eating out, estimate how many servings of a food you are given. For example, a serving of cooked rice is  cup or about the size of half of a fist. Knowing serving sizes will help you be aware of how much food you are eating at restaurants.  Ask for smaller portion sizes or child-size portions at restaurants.  Plan to eat half of a meal at a restaurant. Take the rest home or share the other half  with a friend.  Read the Nutrition Facts panel on food labels for calorie content and serving size. You can find out how many servings are in a package, the size of a serving, and the number of calories each serving has.  For example, a package might contain 3 cookies. The Nutrition Facts panel on that package says that 1 serving is 1 cookie. Below that, it will say there are 3 servings in the container. The calories section of the Nutrition Facts label says there are 90 calories. This means there are 90 calories in 1 cookie (1 serving). If you eat 1 cookie you have eaten 90 calories. If you eat all 3 cookies, you have eaten 270 calories (3 servings x 90 calories = 270 calories). The list below tells you how big or small some common portion sizes are.  1 oz.........4 stacked dice.  3 oz........Marland KitchenDeck of cards.  1 tsp.......Marland KitchenTip of little finger.  1 tbs......Marland KitchenMarland KitchenThumb.  2 tbs.......Marland KitchenGolf ball.   cup......Marland KitchenHalf of a fist.  1 cup.......Marland KitchenA fist. KEEP A FOOD LOG Write down every food item you eat, the amount you eat, and the number of calories in each food you eat during the day. At the end of the day, you can add up the total number of calories you have eaten. It may help to keep a list like  the one below. Find out the calorie information by reading the Nutrition Facts panel on food labels. Breakfast  Bran cereal (1 cup, 110 calories).  Fat-free milk ( cup, 45 calories). Snack  Apple (1 medium, 80 calories). Lunch  Spinach (1 cup, 20 calories).  Tomato ( medium, 20 calories).  Chicken breast strips (3 oz, 165 calories).  Shredded cheddar cheese ( cup, 110 calories).  Light [place] dressing (2 tbs, 60 calories).  Whole-wheat bread (1 slice, 80 calories).  Tub margarine (1 tsp, 35 calories).  Vegetable soup (1 cup, 160 calories). Dinner  Pork chop (3 oz, 190 calories).  Brown rice (1 cup, 215 calories).  Steamed broccoli ( cup, 20 calories).  Strawberries (1  cup,  65 calories).  Whipped cream (1 tbs, 50 calories). Daily Calorie Total: 7672 Document Released: 08/31/2005 Document Revised: 11/23/2011 Document Reviewed: 02/25/2007 [facility] Patient Information 2014 [place].

## 2013-10-31 ENCOUNTER — Ambulatory Visit: Payer: BC Managed Care – PPO | Admitting: Endocrinology

## 2013-12-16 ENCOUNTER — Encounter: Payer: Self-pay | Admitting: Family Medicine

## 2013-12-16 ENCOUNTER — Ambulatory Visit (INDEPENDENT_AMBULATORY_CARE_PROVIDER_SITE_OTHER): Payer: BC Managed Care – PPO | Admitting: Family Medicine

## 2013-12-16 VITALS — BP 148/88 | Temp 98.2°F | Wt 179.0 lb

## 2013-12-16 DIAGNOSIS — R300 Dysuria: Secondary | ICD-10-CM

## 2013-12-16 DIAGNOSIS — N390 Urinary tract infection, site not specified: Secondary | ICD-10-CM

## 2013-12-16 LAB — POCT URINALYSIS DIPSTICK
Bilirubin, UA: NEGATIVE
GLUCOSE UA: NEGATIVE
KETONES UA: NEGATIVE
Nitrite, UA: NEGATIVE
Protein, UA: NEGATIVE
Spec Grav, UA: 1.015
UROBILINOGEN UA: 0.2
pH, UA: 6

## 2013-12-16 MED ORDER — CEPHALEXIN 500 MG PO CAPS: 500.0000 mg | ORAL_CAPSULE | Freq: Two times a day (BID) | ORAL | Status: AC

## 2013-12-16 NOTE — Progress Notes (Signed)
   Subjective:    Patient ID: Laura [name], female    DOB: March 04, 1956, 58 y.o.   MRN: [id number]  Urinary Tract Infection    UTI- sxs started w/ low back pain on R then developed 'foul odor in urine, increased pressure'.  Thursday started Cranberry juice.  This AM had severe pressure, pain, and faint blood in urine.    Review of Systems For ROS see HPI     Objective:   Physical Exam  Vitals reviewed. Constitutional: She appears well-developed and well-nourished. No distress.  Abdominal: Soft. She exhibits no distension. There is no tenderness (no suprapubic or CVA tenderness).          Assessment & Plan:

## 2013-12-16 NOTE — Patient Instructions (Signed)
Follow up as needed Start the Keflex twice daily Drink plenty of fluids OTC AZO for burning Call with any questions or concerns Hang in there! Happy [place]!

## 2013-12-16 NOTE — Progress Notes (Signed)
Pre visit review using our clinic review tool, if applicable. No additional management support is needed unless otherwise documented below in the visit note. 

## 2013-12-16 NOTE — Assessment & Plan Note (Signed)
New.  Pt's sxs and UA consistent w/ infxn.  Start abx.  Await culture results.

## 2013-12-18 LAB — URINE CULTURE

## 2013-12-19 ENCOUNTER — Other Ambulatory Visit: Payer: Self-pay | Admitting: Family Medicine

## 2014-02-28 ENCOUNTER — Other Ambulatory Visit: Payer: Self-pay | Admitting: Gynecology

## 2014-02-28 DIAGNOSIS — R928 Other abnormal and inconclusive findings on diagnostic imaging of breast: Secondary | ICD-10-CM

## 2014-03-09 ENCOUNTER — Other Ambulatory Visit: Payer: Self-pay | Admitting: Gynecology

## 2014-03-09 ENCOUNTER — Ambulatory Visit
Admission: RE | Admit: 2014-03-09 | Discharge: 2014-03-09 | Disposition: A | Discharge status: Home / Self Care | Payer: BC Managed Care – PPO | Source: Ambulatory Visit | Attending: Gynecology | Admitting: Gynecology

## 2014-03-09 DIAGNOSIS — R928 Other abnormal and inconclusive findings on diagnostic imaging of breast: Secondary | ICD-10-CM

## 2014-03-21 ENCOUNTER — Ambulatory Visit
Admission: RE | Admit: 2014-03-21 | Discharge: 2014-03-21 | Disposition: A | Discharge status: Home / Self Care | Payer: BC Managed Care – PPO | Source: Ambulatory Visit | Attending: Gynecology | Admitting: Gynecology

## 2014-03-21 DIAGNOSIS — R928 Other abnormal and inconclusive findings on diagnostic imaging of breast: Secondary | ICD-10-CM

## 2014-04-06 ENCOUNTER — Ambulatory Visit (INDEPENDENT_AMBULATORY_CARE_PROVIDER_SITE_OTHER): Payer: Self-pay | Admitting: Surgery

## 2014-04-09 ENCOUNTER — Ambulatory Visit (INDEPENDENT_AMBULATORY_CARE_PROVIDER_SITE_OTHER): Payer: PRIVATE HEALTH INSURANCE | Admitting: General Surgery

## 2014-04-09 ENCOUNTER — Encounter (INDEPENDENT_AMBULATORY_CARE_PROVIDER_SITE_OTHER): Payer: Self-pay | Admitting: General Surgery

## 2014-04-09 VITALS — BP 132/76 | HR 68 | Temp 98.2°F | Resp 14 | Ht 67.0 in | Wt 176.2 lb

## 2014-04-09 DIAGNOSIS — N63 Unspecified lump in unspecified breast: Secondary | ICD-10-CM

## 2014-04-09 DIAGNOSIS — N6320 Unspecified lump in the left breast, unspecified quadrant: Secondary | ICD-10-CM

## 2014-04-09 NOTE — Progress Notes (Signed)
Patient ID: [name], female   DOB: 1955/09/27, 58 y.o.   MRN: [id number]  Chief Complaint  Patient presents with  . breast mass    HPI Laura [name] is a 58 y.o. female.  Referred by Dr. Shon [name] HPI 16 yof who underwent screening mm that shows a left breast lesion that measures 8 mm.  This could not be biopsied radiologically and she is referred for evaluation.  She has no complaints referable to her breasts at all.  She has no family history or personal history.  Past Medical History  Diagnosis Date  . ESOPHAGEAL STRICTURE 02/17/2008  . ALLERGIC RHINITIS 01/25/2008  . GERD 01/25/2008  . ASTHMA 01/25/2008  . Unspecified disorder of liver 01/19/2008  . HYPERGLYCEMIA 01/19/2008  . RECTAL BLEEDING 01/04/2008  . 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 METATARSALGIA 11/08/2007  . Tibialis tendinitis 05/13/2010  . CONSTIPATION 02/21/2008  . VENTRICULAR HYPERTROPHY, LEFT 09/23/2010  . HYPERTENSION 08/05/2010  . ABNORMAL ELECTROCARDIOGRAM 08/05/2010  . ACNE, MILD 02/17/2008  . Impaired glucose tolerance 01/11/2011  . PONV (postoperative nausea and vomiting)     Past Surgical History  Procedure Laterality Date  . Cholecystectomy    . Esophagogastroduodenoscopy  04/01/1998  . Nasal sinus surgery      s/p  . Ovarian cyst removal    . Arthroscopic repair acl  10/1998    Left knee   . US echocardiography  2011  . Cardiovascular stress test  2011  . Orif elbow fracture  12/23/2011    Procedure: OPEN REDUCTION INTERNAL FIXATION (ORIF) ELBOW/OLECRANON FRACTURE;  Surgeon: Laura Hoff, MD;  Location: [facility] [facility];  Service: Orthopedics;  Laterality: Left;  LEFT ELBOW ORIF OF PROXIMAL OLECRANON AND RADIAL HEAD, POSSIBLE ARTHROPLASTY    Family History  Problem Relation Age of Onset  . Diabetes Mother   . Thyroid disease Mother     hypothyroidism  . Cancer Other     Pancreatic Cancer-Aunt    Social History History  Substance Use Topics  . Smoking status: Never Smoker   . Smokeless  tobacco: Not on file  . Alcohol Use: No    No Known Allergies  Current Outpatient Prescriptions  Medication Sig Dispense Refill  . aspirin EC 81 MG tablet Take 81 mg by mouth daily.      . Calcium Carbonate-Vitamin D (CALTRATE 600+D) 600-400 MG-UNIT per tablet Take 2 tablets by mouth daily.       . Fiber CHEW Chew 2 tablets by mouth daily.      . Fluticasone-Salmeterol (ADVAIR DISKUS) 100-50 MCG/DOSE AEPB Inhale 1 puff into the lungs daily as needed. For shortness of breath.      . lansoprazole (PREVACID) 30 MG capsule Take 30 mg by mouth daily as needed. For reflux.      Marland Kitchen losartan (COZAAR) 25 MG tablet Take 1 tablet (25 mg total) by mouth daily.  90 tablet  3  . meclizine (ANTIVERT) 12.5 MG tablet Take 1 tablet (12.5 mg total) by mouth 3 (three) times daily as needed for dizziness.  30 tablet  4  . Multiple Vitamin (MULTIVITAMIN) tablet Take 1 tablet by mouth daily.        . vitamin E (VITAMIN E) 400 UNIT capsule Take 400 Units by mouth daily.        Marland Kitchen alendronate (FOSAMAX) 70 MG tablet Take 1 tablet (70 mg total) by mouth every 7 (seven) days. Take with a full glass of water on an empty stomach.  4 tablet  11  . meloxicam (MOBIC) 15 MG tablet       . [DISCONTINUED] olmesartan (BENICAR) 20 MG tablet Take 20 mg by mouth daily.         No current facility-administered medications for this visit.    Review of Systems Review of Systems  Constitutional: Negative for fever, chills and unexpected weight change.  HENT: Negative for congestion, hearing loss, sore throat, trouble swallowing and voice change.   Eyes: Negative for visual disturbance.  Respiratory: Negative for cough and wheezing.   Cardiovascular: Negative for chest pain, palpitations and leg swelling.  Gastrointestinal: Negative for nausea, vomiting, abdominal pain, diarrhea, constipation, blood in stool, abdominal distention and anal bleeding.  Genitourinary: Negative for hematuria, vaginal bleeding and difficulty urinating.   Musculoskeletal: Negative for arthralgias.  Skin: Negative for rash and wound.  Neurological: Negative for seizures, syncope and headaches.  Hematological: Negative for adenopathy. Does not bruise/bleed easily.  Psychiatric/Behavioral: Negative for confusion.    There were no vitals taken for this visit.  Physical Exam Physical Exam  Vitals reviewed. Constitutional: She appears well-developed and well-nourished.  Cardiovascular: Normal rate, regular rhythm and normal heart sounds.   Pulmonary/Chest: Effort normal and breath sounds normal. She has no wheezes. She has no rales. Right breast exhibits no inverted nipple, no mass, no nipple discharge, no skin change and no tenderness. Left breast exhibits no inverted nipple, no mass, no nipple discharge, no skin change and no tenderness.  Lymphadenopathy:    She has no cervical adenopathy.    She has no axillary adenopathy.       Right: No supraclavicular adenopathy present.       Left: No supraclavicular adenopathy present.    Data Reviewed ADDENDUM:  Patient returns to the [facility] for a  stereotactic biopsy of the left breast on 03/21/2014. The sub cm  low-density nodule in the 6 o'clock region of the left breast could  not be seen stereotactically or sonographically. Surgical excision  is recommended. This was discussed with the patient. Surgical  consultation has been scheduled with Dr. Donne [name] on 04/09/2014.  Electronically Signed  By: Laura [name] M.D.  On: 03/21/2014 11:04       Study Result    CLINICAL DATA: The patient returns after screening study for  evaluation of the left breast.  EXAM:  DIGITAL DIAGNOSTIC LEFT MAMMOGRAM  ULTRASOUND LEFT BREAST  COMPARISON: 02/22/2014 and earlier  ACR Breast Density Category b: There are scattered areas of  fibroglandular density.  FINDINGS:  Additional views are performed, confirming presence of a rounded  nodule in the lower central portion of the left  breast. This  measures approximately 8 mm radiographically.  On physical exam, I palpate no abnormality in the lower central  portion of the left breast.  Ultrasound is performed, showing normal appearing fibroglandular  tissue throughout the lower central aspect of the left breast. No  sonographic correlate is identified for the mammographic  abnormality.  IMPRESSION:  1. Persistent abnormality in the lower central aspect of the left  breast warrants further evaluation with tissue diagnosis.  2. No sonographic correlate for this abnormality.  RECOMMENDATION:  Stereotactic guided core biopsy is recommended and has been  scheduled for the patient on 03/21/2014 at 10 o'clock a.m.  I have discussed the findings and recommendations with the patient.  Results were also provided in writing at the conclusion of the  visit. If applicable, a reminder letter will be sent to the patient  regarding the next appointment.  BI-RADS CATEGORY 4: Suspicious.      Assessment    Left breast mammographic abnormality     Plan    Left breast radioactive seed guided excisional biopsy     We discussed a left breast excisional biopsy to ensure this is not a cancer. She understands the risks as well as the postoperative recovery. We will do this with radioactive seed guidance. I will plan on scheduling her for the next several weeks.    Lawayne Hartig 04/09/2014, 9:59 AM

## 2014-04-24 ENCOUNTER — Other Ambulatory Visit (INDEPENDENT_AMBULATORY_CARE_PROVIDER_SITE_OTHER): Payer: Self-pay | Admitting: General Surgery

## 2014-04-24 DIAGNOSIS — N6320 Unspecified lump in the left breast, unspecified quadrant: Secondary | ICD-10-CM

## 2014-05-08 ENCOUNTER — Encounter (HOSPITAL_BASED_OUTPATIENT_CLINIC_OR_DEPARTMENT_OTHER): Payer: Self-pay | Admitting: *Deleted

## 2014-05-08 NOTE — Progress Notes (Signed)
To come in for labs after seeds 8/31-

## 2014-05-14 ENCOUNTER — Ambulatory Visit
Admission: RE | Admit: 2014-05-14 | Discharge: 2014-05-14 | Disposition: A | Discharge status: Home / Self Care | Payer: BC Managed Care – PPO | Source: Ambulatory Visit | Attending: General Surgery | Admitting: General Surgery

## 2014-05-14 ENCOUNTER — Encounter (HOSPITAL_BASED_OUTPATIENT_CLINIC_OR_DEPARTMENT_OTHER)
Admission: RE | Admit: 2014-05-14 | Discharge: 2014-05-14 | Disposition: A | Discharge status: Home / Self Care | Payer: BC Managed Care – PPO | Source: Ambulatory Visit | Attending: General Surgery | Admitting: General Surgery

## 2014-05-14 DIAGNOSIS — Z7982 Long term (current) use of aspirin: Secondary | ICD-10-CM | POA: Diagnosis not present

## 2014-05-14 DIAGNOSIS — D249 Benign neoplasm of unspecified breast: Secondary | ICD-10-CM | POA: Diagnosis not present

## 2014-05-14 DIAGNOSIS — R928 Other abnormal and inconclusive findings on diagnostic imaging of breast: Secondary | ICD-10-CM | POA: Diagnosis present

## 2014-05-14 DIAGNOSIS — Z79899 Other long term (current) drug therapy: Secondary | ICD-10-CM | POA: Diagnosis not present

## 2014-05-14 DIAGNOSIS — I517 Cardiomegaly: Secondary | ICD-10-CM | POA: Diagnosis not present

## 2014-05-14 DIAGNOSIS — Z800 Family history of malignant neoplasm of digestive organs: Secondary | ICD-10-CM | POA: Diagnosis not present

## 2014-05-14 DIAGNOSIS — J45909 Unspecified asthma, uncomplicated: Secondary | ICD-10-CM | POA: Diagnosis not present

## 2014-05-14 DIAGNOSIS — K219 Gastro-esophageal reflux disease without esophagitis: Secondary | ICD-10-CM | POA: Diagnosis not present

## 2014-05-14 DIAGNOSIS — N6320 Unspecified lump in the left breast, unspecified quadrant: Secondary | ICD-10-CM

## 2014-05-14 DIAGNOSIS — I10 Essential (primary) hypertension: Secondary | ICD-10-CM | POA: Diagnosis not present

## 2014-05-14 LAB — BASIC METABOLIC PANEL
Anion gap: 11 (ref 5–15)
BUN: 16 mg/dL (ref 6–23)
CO2: 27 meq/L (ref 19–32)
Calcium: 9.4 mg/dL (ref 8.4–10.5)
Chloride: 106 mEq/L (ref 96–112)
Creatinine, Ser: 0.53 mg/dL (ref 0.50–1.10)
GFR calc Af Amer: >90 mL/min (ref >90)
Glucose, Bld: 107 mg/dL — ABNORMAL HIGH (ref 70–99)
Potassium: 4.9 mEq/L (ref 3.7–5.3)
Sodium: 144 mEq/L (ref 137–147)

## 2014-05-14 LAB — CBC WITH DIFFERENTIAL/PLATELET
Basophils Absolute: 0.1 10*3/uL (ref 0.0–0.1)
Basophils Relative: 1 % (ref 0–1)
EOS ABS: 0.1 10*3/uL (ref 0.0–0.7)
EOS PCT: 2 % (ref 0–5)
HEMATOCRIT: 42.1 % (ref 36.0–46.0)
Hemoglobin: 14 g/dL (ref 12.0–15.0)
Lymphocytes Relative: 47 % — ABNORMAL HIGH (ref 12–46)
Lymphs Abs: 3.3 10*3/uL (ref 0.7–4.0)
MCH: 30.1 pg (ref 26.0–34.0)
MCHC: 33.3 g/dL (ref 30.0–36.0)
MCV: 90.5 fL (ref 78.0–100.0)
MONOS PCT: 7 % (ref 3–12)
Monocytes Absolute: 0.5 10*3/uL (ref 0.1–1.0)
Neutro Abs: 3 10*3/uL (ref 1.7–7.7)
Neutrophils Relative %: 43 % (ref 43–77)
PLATELETS: 247 10*3/uL (ref 150–400)
RBC: 4.65 MIL/uL (ref 3.87–5.11)
RDW: 13.3 % (ref 11.5–15.5)
WBC: 7 10*3/uL (ref 4.0–10.5)

## 2014-05-15 ENCOUNTER — Ambulatory Visit (HOSPITAL_BASED_OUTPATIENT_CLINIC_OR_DEPARTMENT_OTHER): Payer: BC Managed Care – PPO | Admitting: Anesthesiology

## 2014-05-15 ENCOUNTER — Encounter (HOSPITAL_BASED_OUTPATIENT_CLINIC_OR_DEPARTMENT_OTHER)
Admission: RE | Disposition: A | Discharge status: Home / Self Care | Payer: Self-pay | Source: Ambulatory Visit | Attending: General Surgery

## 2014-05-15 ENCOUNTER — Encounter (HOSPITAL_BASED_OUTPATIENT_CLINIC_OR_DEPARTMENT_OTHER): Payer: BC Managed Care – PPO | Admitting: Anesthesiology

## 2014-05-15 ENCOUNTER — Ambulatory Visit (HOSPITAL_BASED_OUTPATIENT_CLINIC_OR_DEPARTMENT_OTHER)
Admission: RE | Admit: 2014-05-15 | Discharge: 2014-05-15 | Disposition: A | Discharge status: Home / Self Care | Payer: BC Managed Care – PPO | Source: Ambulatory Visit | Attending: General Surgery | Admitting: General Surgery

## 2014-05-15 ENCOUNTER — Ambulatory Visit
Admission: RE | Admit: 2014-05-15 | Discharge: 2014-05-15 | Disposition: A | Discharge status: Home / Self Care | Payer: BC Managed Care – PPO | Source: Ambulatory Visit | Attending: General Surgery | Admitting: General Surgery

## 2014-05-15 ENCOUNTER — Encounter (HOSPITAL_BASED_OUTPATIENT_CLINIC_OR_DEPARTMENT_OTHER): Payer: Self-pay

## 2014-05-15 DIAGNOSIS — Z79899 Other long term (current) drug therapy: Secondary | ICD-10-CM | POA: Insufficient documentation

## 2014-05-15 DIAGNOSIS — K219 Gastro-esophageal reflux disease without esophagitis: Secondary | ICD-10-CM | POA: Insufficient documentation

## 2014-05-15 DIAGNOSIS — Z800 Family history of malignant neoplasm of digestive organs: Secondary | ICD-10-CM | POA: Insufficient documentation

## 2014-05-15 DIAGNOSIS — D249 Benign neoplasm of unspecified breast: Secondary | ICD-10-CM | POA: Diagnosis not present

## 2014-05-15 DIAGNOSIS — Z7982 Long term (current) use of aspirin: Secondary | ICD-10-CM | POA: Insufficient documentation

## 2014-05-15 DIAGNOSIS — I517 Cardiomegaly: Secondary | ICD-10-CM | POA: Insufficient documentation

## 2014-05-15 DIAGNOSIS — I10 Essential (primary) hypertension: Secondary | ICD-10-CM | POA: Insufficient documentation

## 2014-05-15 DIAGNOSIS — J45909 Unspecified asthma, uncomplicated: Secondary | ICD-10-CM | POA: Insufficient documentation

## 2014-05-15 DIAGNOSIS — N6320 Unspecified lump in the left breast, unspecified quadrant: Secondary | ICD-10-CM

## 2014-05-15 HISTORY — DX: Presence of spectacles and contact lenses: Z97.3

## 2014-05-15 SURGERY — RADIOACTIVE SEED GUIDED BREAST BIOPSY
Anesthesia: General | Site: Breast | Laterality: Left

## 2014-05-15 MED ORDER — DEXAMETHASONE SODIUM PHOSPHATE 4 MG/ML IJ SOLN
INTRAMUSCULAR | Status: DC | PRN
  Administered 2014-05-15: 10 mg via INTRAVENOUS

## 2014-05-15 MED ORDER — BUPIVACAINE HCL (PF) 0.25 % IJ SOLN
INTRAMUSCULAR | Status: DC | PRN
  Administered 2014-05-15: 20 mL

## 2014-05-15 MED ORDER — PROPOFOL 10 MG/ML IV BOLUS
INTRAVENOUS | Status: AC
  Filled 2014-05-15: qty 20

## 2014-05-15 MED ORDER — CEFAZOLIN SODIUM-DEXTROSE 2-3 GM-% IV SOLR
2.0000 g | INTRAVENOUS | Status: AC
  Administered 2014-05-15: 2 g via INTRAVENOUS

## 2014-05-15 MED ORDER — OXYCODONE HCL 5 MG PO TABS: 5.0000 mg | ORAL_TABLET | Freq: Once | ORAL | Status: DC | PRN

## 2014-05-15 MED ORDER — SCOPOLAMINE 1 MG/3DAYS TD PT72
MEDICATED_PATCH | TRANSDERMAL | Status: AC
  Filled 2014-05-15: qty 1

## 2014-05-15 MED ORDER — OXYCODONE-ACETAMINOPHEN 10-325 MG PO TABS: 1.0000 | ORAL_TABLET | Freq: Four times a day (QID) | ORAL | Status: DC | PRN

## 2014-05-15 MED ORDER — SCOPOLAMINE 1 MG/3DAYS TD PT72
1.0000 | MEDICATED_PATCH | TRANSDERMAL | Status: DC
  Administered 2014-05-15: 1.5 mg via TRANSDERMAL

## 2014-05-15 MED ORDER — OXYCODONE HCL 5 MG/5ML PO SOLN: 5.0000 mg | Freq: Once | ORAL | Status: DC | PRN

## 2014-05-15 MED ORDER — FENTANYL CITRATE 0.05 MG/ML IJ SOLN
INTRAMUSCULAR | Status: DC | PRN
  Administered 2014-05-15: 100 ug via INTRAVENOUS

## 2014-05-15 MED ORDER — LACTATED RINGERS IV SOLN
INTRAVENOUS | Status: DC
  Administered 2014-05-15: 10 mL/h via INTRAVENOUS
  Administered 2014-05-15: 08:00:00 via INTRAVENOUS

## 2014-05-15 MED ORDER — CEFAZOLIN SODIUM-DEXTROSE 2-3 GM-% IV SOLR
INTRAVENOUS | Status: AC
  Filled 2014-05-15: qty 50

## 2014-05-15 MED ORDER — FENTANYL CITRATE 0.05 MG/ML IJ SOLN
INTRAMUSCULAR | Status: AC
  Filled 2014-05-15: qty 4

## 2014-05-15 MED ORDER — ONDANSETRON HCL 4 MG/2ML IJ SOLN
INTRAMUSCULAR | Status: DC | PRN
  Administered 2014-05-15: 4 mg via INTRAVENOUS

## 2014-05-15 MED ORDER — LIDOCAINE HCL (CARDIAC) 20 MG/ML IV SOLN
INTRAVENOUS | Status: DC | PRN
  Administered 2014-05-15: 100 mg via INTRAVENOUS

## 2014-05-15 MED ORDER — ONDANSETRON HCL 4 MG/2ML IJ SOLN: 4.0000 mg | Freq: Once | INTRAMUSCULAR | Status: DC | PRN

## 2014-05-15 MED ORDER — MIDAZOLAM HCL 5 MG/5ML IJ SOLN
INTRAMUSCULAR | Status: DC | PRN
  Administered 2014-05-15: 2 mg via INTRAVENOUS

## 2014-05-15 MED ORDER — PROPOFOL 10 MG/ML IV BOLUS
INTRAVENOUS | Status: DC | PRN
  Administered 2014-05-15: 200 mg via INTRAVENOUS

## 2014-05-15 MED ORDER — MIDAZOLAM HCL 2 MG/2ML IJ SOLN
INTRAMUSCULAR | Status: AC
  Filled 2014-05-15: qty 2

## 2014-05-15 MED ORDER — HYDROMORPHONE HCL PF 1 MG/ML IJ SOLN
INTRAMUSCULAR | Status: AC
  Filled 2014-05-15: qty 1

## 2014-05-15 MED ORDER — BUPIVACAINE HCL (PF) 0.25 % IJ SOLN
INTRAMUSCULAR | Status: AC
  Filled 2014-05-15: qty 30

## 2014-05-15 MED ORDER — FENTANYL CITRATE 0.05 MG/ML IJ SOLN: 50.0000 ug | INTRAMUSCULAR | Status: DC | PRN

## 2014-05-15 MED ORDER — HYDROMORPHONE HCL PF 1 MG/ML IJ SOLN
0.2500 mg | INTRAMUSCULAR | Status: DC | PRN
  Administered 2014-05-15 (×2): 0.5 mg via INTRAVENOUS

## 2014-05-15 MED ORDER — MIDAZOLAM HCL 2 MG/2ML IJ SOLN: 1.0000 mg | INTRAMUSCULAR | Status: DC | PRN

## 2014-05-15 SURGICAL SUPPLY — 62 items
ADH SKN CLS APL DERMABOND .7 (GAUZE/BANDAGES/DRESSINGS) ×1
APL SKNCLS STERI-STRIP NONHPOA (GAUZE/BANDAGES/DRESSINGS)
APPLIER CLIP 9.375 MED OPEN (MISCELLANEOUS)
APR CLP MED 9.3 20 MLT OPN (MISCELLANEOUS)
BENZOIN TINCTURE PRP APPL 2/3 (GAUZE/BANDAGES/DRESSINGS) IMPLANT
BINDER BREAST LRG (GAUZE/BANDAGES/DRESSINGS) ×2 IMPLANT
BINDER BREAST MEDIUM (GAUZE/BANDAGES/DRESSINGS) IMPLANT
BINDER BREAST XLRG (GAUZE/BANDAGES/DRESSINGS) IMPLANT
BINDER BREAST XXLRG (GAUZE/BANDAGES/DRESSINGS) IMPLANT
BLADE SURG 15 STRL LF DISP TIS (BLADE) ×1 IMPLANT
BLADE SURG 15 STRL SS (BLADE) ×3
CANISTER SUC SOCK COL 7IN (MISCELLANEOUS) IMPLANT
CANISTER SUCT 1200ML W/VALVE (MISCELLANEOUS) IMPLANT
CHLORAPREP W/TINT 26ML (MISCELLANEOUS) ×3 IMPLANT
CLIP APPLIE 9.375 MED OPEN (MISCELLANEOUS) IMPLANT
CLOSURE WOUND 1/2 X4 (GAUZE/BANDAGES/DRESSINGS) ×1
COVER MAYO STAND STRL (DRAPES) ×3 IMPLANT
COVER PROBE W GEL 5X96 (DRAPES) ×3 IMPLANT
COVER TABLE BACK 60X90 (DRAPES) ×3 IMPLANT
DECANTER SPIKE VIAL GLASS SM (MISCELLANEOUS) IMPLANT
DERMABOND ADVANCED (GAUZE/BANDAGES/DRESSINGS) ×2
DERMABOND ADVANCED .7 DNX12 (GAUZE/BANDAGES/DRESSINGS) ×1 IMPLANT
DEVICE DUBIN W/COMP PLATE 8390 (MISCELLANEOUS) ×3 IMPLANT
DRAPE PED LAPAROTOMY (DRAPES) ×3 IMPLANT
DRSG TEGADERM 4X4.75 (GAUZE/BANDAGES/DRESSINGS) IMPLANT
ELECT COATED BLADE 2.86 ST (ELECTRODE) ×3 IMPLANT
ELECT REM PT RETURN 9FT ADLT (ELECTROSURGICAL) ×3
ELECTRODE REM PT RTRN 9FT ADLT (ELECTROSURGICAL) ×1 IMPLANT
GLOVE BIO SURGEON STRL SZ7 (GLOVE) ×6 IMPLANT
GLOVE BIOGEL PI IND STRL 6.5 (GLOVE) IMPLANT
GLOVE BIOGEL PI IND STRL 7.5 (GLOVE) ×1 IMPLANT
GLOVE BIOGEL PI INDICATOR 6.5 (GLOVE) ×2
GLOVE BIOGEL PI INDICATOR 7.5 (GLOVE) ×2
GLOVE ECLIPSE 6.5 STRL STRAW (GLOVE) ×2 IMPLANT
GOWN STRL REUS W/ TWL LRG LVL3 (GOWN DISPOSABLE) ×2 IMPLANT
GOWN STRL REUS W/TWL LRG LVL3 (GOWN DISPOSABLE) ×6
KIT MARKER MARGIN INK (KITS) ×3 IMPLANT
NDL HYPO 25X1 1.5 SAFETY (NEEDLE) ×1 IMPLANT
NEEDLE HYPO 25X1 1.5 SAFETY (NEEDLE) ×3 IMPLANT
NS IRRIG 1000ML POUR BTL (IV SOLUTION) IMPLANT
PACK BASIN DAY SURGERY FS (CUSTOM PROCEDURE TRAY) ×3 IMPLANT
PENCIL BUTTON HOLSTER BLD 10FT (ELECTRODE) ×3 IMPLANT
SHEET MEDIUM DRAPE 40X70 STRL (DRAPES) IMPLANT
SLEEVE SCD COMPRESS KNEE MED (MISCELLANEOUS) ×3 IMPLANT
SPONGE GAUZE 4X4 12PLY STER LF (GAUZE/BANDAGES/DRESSINGS) IMPLANT
SPONGE LAP 4X18 X RAY DECT (DISPOSABLE) ×3 IMPLANT
STAPLER VISISTAT 35W (STAPLE) IMPLANT
STRIP CLOSURE SKIN 1/2X4 (GAUZE/BANDAGES/DRESSINGS) ×2 IMPLANT
SUT MNCRL AB 4-0 PS2 18 (SUTURE) ×2 IMPLANT
SUT MON AB 5-0 PS2 18 (SUTURE) IMPLANT
SUT SILK 2 0 SH (SUTURE) IMPLANT
SUT VIC AB 2-0 SH 27 (SUTURE) ×3
SUT VIC AB 2-0 SH 27XBRD (SUTURE) ×1 IMPLANT
SUT VIC AB 3-0 SH 27 (SUTURE) ×3
SUT VIC AB 3-0 SH 27X BRD (SUTURE) ×1 IMPLANT
SUT VIC AB 5-0 PS2 18 (SUTURE) IMPLANT
SYR CONTROL 10ML LL (SYRINGE) ×3 IMPLANT
TOWEL OR 17X24 6PK STRL BLUE (TOWEL DISPOSABLE) ×3 IMPLANT
TOWEL OR NON WOVEN STRL DISP B (DISPOSABLE) ×1 IMPLANT
TUBE CONNECTING 20'X1/4 (TUBING)
TUBE CONNECTING 20X1/4 (TUBING) IMPLANT
YANKAUER SUCT BULB TIP NO VENT (SUCTIONS) IMPLANT

## 2014-05-15 NOTE — Anesthesia Procedure Notes (Signed)
Procedure Name: LMA Insertion Date/Time: 05/15/2014 8:59 AM Performed by: Maryella Shivers Pre-anesthesia Checklist: Patient identified, Emergency Drugs available, Suction available and Patient being monitored Patient Re-evaluated:Patient Re-evaluated prior to inductionOxygen Delivery Method: Circle System Utilized Preoxygenation: Pre-oxygenation with 100% oxygen Intubation Type: IV induction Ventilation: Mask ventilation without difficulty LMA: LMA inserted LMA Size: 4.0 Number of attempts: 1 Airway Equipment and Method: bite block Placement Confirmation: positive ETCO2 Tube secured with: Tape Dental Injury: Teeth and Oropharynx as per pre-operative assessment

## 2014-05-15 NOTE — Op Note (Signed)
Preoperative diagnosis: Left breast mammographic abnormality, not amenable to radiologic biopsy Postoperative diagnosis: same as above Procedure: Left breast radioactive seed guided excision Surgeon: Dr. Serita Grammes Anesthesia: Gen. Estimated blood loss: Minimal Complications: None Specimens: Left breast tissue marked with paint Drains: None Sponge and needle count correct Disposition to recovery stable  Indications: This 58 year old female underwent a routine screening mammogram with a subcentimeter left breast mass. She was recommended a core biopsy which was not able to be obtained either stereotactically or by ultrasound. She was then referred to see me and we discussed excision to ensure that there is no carcinoma associated with this. We discussed doing this under radioactive seed guidance.  Procedure: She first had a radioactive seed placed. I had these mammograms in the operating room. After informed consent was obtained she then was taken to the operating room. She was given 2 g of cefazolin. Sequential compression devices were on her legs. She was placed under general anesthesia without complication. Her left breast was prepped and draped in the standard sterile surgical fashion. A surgical timeout was then performed.  I located the seed with the neoprobe. I then made a curvilinear incision in the lower pole of her breast. I then used the neoprobe to guide the excision of the seed and some of the surrounding tissue. I then marked this with paint. Faxitron mammogram confirmed removal of the seed. I then confirmed that there was no more radioactivity in the breast. Hemostasis was obtained. I then closes with 2-0 Vicryl, 3-0 Vicryl, and 4-0 Monocryl. Dermabond and Steri-Strips were placed. I infiltrated Marcaine throughout this area. She was extubated and transferred to recovery stable.

## 2014-05-15 NOTE — Transfer of Care (Signed)
Immediate Anesthesia Transfer of Care Note  Patient: Laura [name]  Procedure(s) Performed: Procedure(s): RADIOACTIVE SEED GUIDED EXCISIONAL BREAST BIOPSY (Left)  Patient Location: PACU  Anesthesia Type:General  Level of Consciousness: awake, alert  and oriented  Airway & Oxygen Therapy: Patient Spontanous Breathing and Patient connected to face mask oxygen  Post-op Assessment: Report given to PACU RN and Post -op Vital signs reviewed and stable  Post vital signs: Reviewed and stable  Complications: No apparent anesthesia complications

## 2014-05-15 NOTE — Anesthesia Postprocedure Evaluation (Signed)
  Anesthesia Post-op Note  Patient: Laura [name]  Procedure(s) Performed: Procedure(s): RADIOACTIVE SEED GUIDED EXCISIONAL BREAST BIOPSY (Left)  Patient Location: PACU  Anesthesia Type: General   Level of Consciousness: awake, alert  and oriented  Airway and Oxygen Therapy: Patient Spontanous Breathing  Post-op Pain: mild  Post-op Assessment: Post-op Vital signs reviewed  Post-op Vital Signs: Reviewed  Last Vitals:  Filed Vitals:   05/15/14 1000  BP: 103/64  Pulse: 63  Temp:   Resp: 15    Complications: No apparent anesthesia complications

## 2014-05-15 NOTE — Discharge Instructions (Signed)
[facility] °Office Phone Number [phone] ° °BREAST BIOPSY/ PARTIAL MASTECTOMY: POST OP INSTRUCTIONS ° °Always review your discharge instruction sheet given to you by the facility where your surgery was performed. ° °IF YOU HAVE DISABILITY OR FAMILY LEAVE FORMS, YOU MUST BRING THEM TO THE OFFICE FOR PROCESSING.  DO NOT GIVE THEM TO YOUR DOCTOR. ° °1. A prescription for pain medication may be given to you upon discharge.  Take your pain medication as prescribed, if needed.  If narcotic pain medicine is not needed, then you may take acetaminophen (Tylenol), naprosyn (Alleve) or ibuprofen (Advil) as needed. °2. Take your usually prescribed medications unless otherwise directed °3. If you need a refill on your pain medication, please contact your pharmacy.  They will contact our office to request authorization.  Prescriptions will not be filled after 5pm or on week-ends. °4. You should eat very light the first 24 hours after surgery, such as soup, crackers, pudding, etc.  Resume your normal diet the day after surgery. °5. Most patients will experience some swelling and bruising in the breast.  Ice packs and a good support bra will help.  Wear the breast binder provided or a sports bra for 72 hours day and night.  After that wear a sports bra during the day until you return to the office. Swelling and bruising can take several days to resolve.  °6. It is common to experience some constipation if taking pain medication after surgery.  Increasing fluid intake and taking a stool softener will usually help or prevent this problem from occurring.  A mild laxative (Milk of Magnesia or Miralax) should be taken according to package directions if there are no bowel movements after 48 hours. °7. Unless discharge instructions indicate otherwise, you may remove your bandages 48 hours after surgery and you may shower at that time.  You may have steri-strips (small skin tapes) in place directly over the incision.   These strips should be left on the skin for 7-10 days and will come off on their own.  If your surgeon used skin glue on the incision, you may shower in 24 hours.  The glue will flake off over the next 2-3 weeks.  Any sutures or staples will be removed at the office during your follow-up visit. °8. ACTIVITIES:  You may resume regular daily activities (gradually increasing) beginning the next day.  Wearing a good support bra or sports bra minimizes pain and swelling.  You may have sexual intercourse when it is comfortable. °a. You may drive when you no longer are taking prescription pain medication, you can comfortably wear a seatbelt, and you can safely maneuver your car and apply brakes. °b. RETURN TO WORK:  ______________________________________________________________________________________ °9. You should see your doctor in the office for a follow-up appointment approximately two weeks after your surgery.  Your doctor’s nurse will typically make your follow-up appointment when she calls you with your pathology report.  Expect your pathology report 3-4 business days after your surgery.  You may call to check if you do not hear from us after three days. °10. OTHER INSTRUCTIONS: _______________________________________________________________________________________________ _____________________________________________________________________________________________________________________________________ °_____________________________________________________________________________________________________________________________________ °_____________________________________________________________________________________________________________________________________ ° °WHEN TO CALL DR WAKEFIELD: °1. Fever over 101.0 °2. Nausea and/or vomiting. °3. Extreme swelling or bruising. °4. Continued bleeding from incision. °5. Increased pain, redness, or drainage from the incision. ° °The clinic staff is available to  answer your questions during regular business hours.  Please don’t hesitate to call and ask to speak to one of the nurses for   clinical concerns.  If you have a medical emergency, go to the nearest emergency room or call 911.  A surgeon from [facility] is always on call at the hospital. ° °For further questions, please visit [email or web address] ° °Post Anesthesia Home Care Instructions ° °Activity: °Get plenty of rest for the remainder of the day. A responsible adult should stay with you for 24 hours following the procedure.  °For the next 24 hours, DO NOT: °-Drive a car °-Operate machinery °-Drink alcoholic beverages °-Take any medication unless instructed by your physician °-Make any legal decisions or sign important papers. ° °Meals: °Start with liquid foods such as gelatin or soup. Progress to regular foods as tolerated. Avoid greasy, spicy, heavy foods. If nausea and/or vomiting occur, drink only clear liquids until the nausea and/or vomiting subsides. Call your physician if vomiting continues. ° °Special Instructions/Symptoms: °Your throat may feel dry or sore from the anesthesia or the breathing tube placed in your throat during surgery. If this causes discomfort, gargle with warm salt water. The discomfort should disappear within 24 hours. ° °

## 2014-05-15 NOTE — H&P (Signed)
42 yof who underwent screening mm that shows a left breast lesion that measures 8 mm. This could not be biopsied radiologically and she is referred for evaluation. She has no complaints referable to her breasts at all. She has no family history or personal history.   Past Medical History   Diagnosis  Date   .  ESOPHAGEAL STRICTURE  02/17/2008   .  ALLERGIC RHINITIS  01/25/2008   .  GERD  01/25/2008   .  ASTHMA  01/25/2008   .  Unspecified disorder of liver  01/19/2008   .  HYPERGLYCEMIA  01/19/2008   .  RECTAL BLEEDING  01/04/2008   .  Cavus deformity of foot, acquired  11/08/2007   .  METATARSALGIA  11/08/2007   .  Tibialis tendinitis  05/13/2010   .  CONSTIPATION  02/21/2008   .  VENTRICULAR HYPERTROPHY, LEFT  09/23/2010   .  HYPERTENSION  08/05/2010   .  ABNORMAL ELECTROCARDIOGRAM  08/05/2010   .  ACNE, MILD  02/17/2008   .  Impaired glucose tolerance  01/11/2011   .  PONV (postoperative nausea and vomiting)     Past Surgical History   Procedure  Laterality  Date   .  Cholecystectomy     .  Esophagogastroduodenoscopy   04/01/1998   .  Nasal sinus surgery       s/p   .  Ovarian cyst removal     .  Arthroscopic repair acl   10/1998     Left knee   .  US echocardiography   2011   .  Cardiovascular stress test   2011   .  Orif elbow fracture   12/23/2011     Procedure: OPEN REDUCTION INTERNAL FIXATION (ORIF) ELBOW/OLECRANON FRACTURE; Surgeon: Linna Hoff, MD; Location: [facility]; Service: Orthopedics; Laterality: Left; LEFT ELBOW ORIF OF PROXIMAL OLECRANON AND RADIAL HEAD, POSSIBLE ARTHROPLASTY    Family History   Problem  Relation  Age of Onset   .  Diabetes  Mother    .  Thyroid disease  Mother      hypothyroidism   .  Cancer  Other      Pancreatic Cancer-Aunt   Social History  History   Substance Use Topics   .  Smoking status:  Never Smoker   .  Smokeless tobacco:  Not on file   .  Alcohol Use:  No   No Known Allergies  Current Outpatient Prescriptions   Medication  Sig  Dispense   Refill   .  aspirin EC 81 MG tablet  Take 81 mg by mouth daily.     .  Calcium Carbonate-Vitamin D (CALTRATE 600+D) 600-400 MG-UNIT per tablet  Take 2 tablets by mouth daily.     .  Fiber CHEW  Chew 2 tablets by mouth daily.     .  Fluticasone-Salmeterol (ADVAIR DISKUS) 100-50 MCG/DOSE AEPB  Inhale 1 puff into the lungs daily as needed. For shortness of breath.     .  lansoprazole (PREVACID) 30 MG capsule  Take 30 mg by mouth daily as needed. For reflux.     Marland Kitchen  losartan (COZAAR) 25 MG tablet  Take 1 tablet (25 mg total) by mouth daily.  90 tablet  3   .  meclizine (ANTIVERT) 12.5 MG tablet  Take 1 tablet (12.5 mg total) by mouth 3 (three) times daily as needed for dizziness.  30 tablet  4   .  Multiple Vitamin (MULTIVITAMIN) tablet  Take 1 tablet by mouth daily.     .  vitamin E (VITAMIN E) 400 UNIT capsule  Take 400 Units by mouth daily.     Marland Kitchen  alendronate (FOSAMAX) 70 MG tablet  Take 1 tablet (70 mg total) by mouth every 7 (seven) days. Take with a full glass of water on an empty stomach.  4 tablet  11   .  meloxicam (MOBIC) 15 MG tablet      .  [DISCONTINUED] olmesartan (BENICAR) 20 MG tablet  Take 20 mg by mouth daily.      No current facility-administered medications for this visit.   Review of Systems  Review of Systems  Constitutional: Negative for fever, chills and unexpected weight change.  HENT: Negative for congestion, hearing loss, sore throat, trouble swallowing and voice change.  Eyes: Negative for visual disturbance.  Respiratory: Negative for cough and wheezing.  Cardiovascular: Negative for chest pain, palpitations and leg swelling.  Gastrointestinal: Negative for nausea, vomiting, abdominal pain, diarrhea, constipation, blood in stool, abdominal distention and anal bleeding.   Physical Exam  Physical Exam  Vitals reviewed.  Constitutional: She appears well-developed and well-nourished.  Cardiovascular: Normal rate, regular rhythm and normal heart sounds.   Pulmonary/Chest: Effort normal and breath sounds normal. She has no wheezes. She has no rales. Right breast exhibits no inverted nipple, no mass, no nipple discharge, no skin change and no tenderness. Left breast exhibits no inverted nipple, no mass, no nipple discharge, no skin change and no tenderness.   Data Reviewed  ADDENDUM:  Patient returns to the [facility] for a  stereotactic biopsy of the left breast on 03/21/2014. The sub cm  low-density nodule in the 6 o'clock region of the left breast could  not be seen stereotactically or sonographically. Surgical excision  is recommended. This was discussed with the patient. Surgical  consultation has been scheduled with Dr. Donne Hazel on 04/09/2014.  Electronically Signed  By: Lillia Mountain M.D.  On: 03/21/2014 11:04       Study Result     CLINICAL DATA: The patient returns after screening study for  evaluation of the left breast.  EXAM:  DIGITAL DIAGNOSTIC LEFT MAMMOGRAM  ULTRASOUND LEFT BREAST  COMPARISON: 02/22/2014 and earlier  ACR Breast Density Category b: There are scattered areas of  fibroglandular density.  FINDINGS:  Additional views are performed, confirming presence of a rounded  nodule in the lower central portion of the left breast. This  measures approximately 8 mm radiographically.  On physical exam, I palpate no abnormality in the lower central  portion of the left breast.  Ultrasound is performed, showing normal appearing fibroglandular  tissue throughout the lower central aspect of the left breast. No  sonographic correlate is identified for the mammographic  abnormality.  IMPRESSION:  1. Persistent abnormality in the lower central aspect of the left  breast warrants further evaluation with tissue diagnosis.  2. No sonographic correlate for this abnormality.  RECOMMENDATION:  Stereotactic guided core biopsy is recommended and has been  scheduled for the patient on 03/21/2014 at 10 o'clock  a.m.  I have discussed the findings and recommendations with the patient.  Results were also provided in writing at the conclusion of the  visit. If applicable, a reminder letter will be sent to the patient  regarding the next appointment.  BI-RADS CATEGORY 4: Suspicious.   Assessment  Left breast mammographic abnormality  Plan  Left breast radioactive seed guided excisional biopsy

## 2014-05-15 NOTE — Anesthesia Preprocedure Evaluation (Signed)
Anesthesia Evaluation  Patient identified by MRN, date of birth, ID band Patient awake    Reviewed: Allergy & Precautions, H&P , NPO status , Patient's Chart, lab work & pertinent test results  Airway Mallampati: I TM Distance: >3 FB Neck ROM: Full    Dental  (+) Teeth Intact, Dental Advisory Given   Pulmonary  breath sounds clear to auscultation        Cardiovascular hypertension, Pt. on medications Rhythm:Regular Rate:Normal     Neuro/Psych    GI/Hepatic GERD-  Medicated and Controlled,  Endo/Other    Renal/GU      Musculoskeletal   Abdominal   Peds  Hematology   Anesthesia Other Findings   Reproductive/Obstetrics                           Anesthesia Physical Anesthesia Plan  ASA: II  Anesthesia Plan: General   Post-op Pain Management:    Induction: Intravenous  Airway Management Planned: LMA  Additional Equipment:   Intra-op Plan:   Post-operative Plan: Extubation in OR  Informed Consent: I have reviewed the patients History and Physical, chart, labs and discussed the procedure including the risks, benefits and alternatives for the proposed anesthesia with the patient or authorized representative who has indicated his/her understanding and acceptance.   Dental advisory given  Plan Discussed with: CRNA, Anesthesiologist and Surgeon  Anesthesia Plan Comments:         Anesthesia Quick Evaluation

## 2014-05-16 ENCOUNTER — Telehealth (INDEPENDENT_AMBULATORY_CARE_PROVIDER_SITE_OTHER): Payer: Self-pay

## 2014-05-16 NOTE — Telephone Encounter (Signed)
Message copied by Illene Regulus on Wed May 16, 2014  3:33 PM ------      Message from: Donne Hazel, MATTHEW      Created: Wed May 16, 2014  1:37 PM       Would you please call her and tell her this is benign            ----- Message -----         From: [facility] [place]: 05/14/2014  12:36 PM           To: Rolm Bookbinder, MD                   ------

## 2014-05-16 NOTE — Telephone Encounter (Signed)
Pt notified of pathology report to be benign per Dr Donne Hazel and f/u appt made for 06/04/14.

## 2014-05-22 ENCOUNTER — Encounter: Payer: BC Managed Care – PPO | Admitting: Endocrinology

## 2014-06-22 ENCOUNTER — Encounter: Payer: Self-pay | Admitting: Endocrinology

## 2014-06-22 ENCOUNTER — Ambulatory Visit (INDEPENDENT_AMBULATORY_CARE_PROVIDER_SITE_OTHER): Payer: BC Managed Care – PPO | Admitting: Endocrinology

## 2014-06-22 VITALS — BP 118/86 | HR 71 | Temp 98.7°F | Ht 67.0 in | Wt 180.0 lb

## 2014-06-22 DIAGNOSIS — Z23 Encounter for immunization: Secondary | ICD-10-CM

## 2014-06-22 DIAGNOSIS — R739 Hyperglycemia, unspecified: Secondary | ICD-10-CM

## 2014-06-22 DIAGNOSIS — Z0000 Encounter for general adult medical examination without abnormal findings: Secondary | ICD-10-CM

## 2014-06-22 DIAGNOSIS — M810 Age-related osteoporosis without current pathological fracture: Secondary | ICD-10-CM | POA: Diagnosis not present

## 2014-06-22 DIAGNOSIS — I10 Essential (primary) hypertension: Secondary | ICD-10-CM

## 2014-06-22 DIAGNOSIS — E7800 Pure hypercholesterolemia, unspecified: Secondary | ICD-10-CM

## 2014-06-22 LAB — CBC WITH DIFFERENTIAL/PLATELET
BASOS PCT: 0.6 % (ref 0.0–3.0)
Basophils Absolute: 0 10*3/uL (ref 0.0–0.1)
Eosinophils Absolute: 0.1 10*3/uL (ref 0.0–0.7)
Eosinophils Relative: 2.1 % (ref 0.0–5.0)
HCT: 40.6 % (ref 36.0–46.0)
HEMOGLOBIN: 13.5 g/dL (ref 12.0–15.0)
LYMPHS PCT: 48.9 % — AB (ref 12.0–46.0)
Lymphs Abs: 3.4 10*3/uL (ref 0.7–4.0)
MCHC: 33.2 g/dL (ref 30.0–36.0)
MCV: 88.6 fl (ref 78.0–100.0)
MONOS PCT: 5.5 % (ref 3.0–12.0)
Monocytes Absolute: 0.4 10*3/uL (ref 0.1–1.0)
Neutro Abs: 3 10*3/uL (ref 1.4–7.7)
Neutrophils Relative %: 42.9 % — ABNORMAL LOW (ref 43.0–77.0)
Platelets: 266 10*3/uL (ref 150.0–400.0)
RBC: 4.59 Mil/uL (ref 3.87–5.11)
RDW: 13.3 % (ref 11.5–15.5)
WBC: 6.9 10*3/uL (ref 4.0–10.5)

## 2014-06-22 LAB — HEPATIC FUNCTION PANEL
ALT: 33 U/L (ref 0–35)
AST: 30 U/L (ref 0–37)
Albumin: 3.8 g/dL (ref 3.5–5.2)
Alkaline Phosphatase: 86 U/L (ref 39–117)
BILIRUBIN DIRECT: 0 mg/dL (ref 0.0–0.3)
BILIRUBIN TOTAL: 0.4 mg/dL (ref 0.2–1.2)
Total Protein: 7.7 g/dL (ref 6.0–8.3)

## 2014-06-22 LAB — URINALYSIS, ROUTINE W REFLEX MICROSCOPIC
Bilirubin Urine: NEGATIVE
Hgb urine dipstick: NEGATIVE
KETONES UR: NEGATIVE
NITRITE: NEGATIVE
PH: 6 (ref 5.0–8.0)
SPECIFIC GRAVITY, URINE: 1.02 (ref 1.000–1.030)
Total Protein, Urine: NEGATIVE
Urine Glucose: NEGATIVE
Urobilinogen, UA: 0.2 (ref 0.0–1.0)

## 2014-06-22 LAB — BASIC METABOLIC PANEL
BUN: 11 mg/dL (ref 6–23)
CO2: 22 meq/L (ref 19–32)
Calcium: 9.3 mg/dL (ref 8.4–10.5)
Chloride: 109 mEq/L (ref 96–112)
Creatinine, Ser: 0.6 mg/dL (ref 0.4–1.2)
GFR: 120.82 mL/min (ref >60.00)
GLUCOSE: 108 mg/dL — AB (ref 70–99)
POTASSIUM: 3.8 meq/L (ref 3.5–5.1)
SODIUM: 139 meq/L (ref 135–145)

## 2014-06-22 LAB — LDL CHOLESTEROL, DIRECT: LDL DIRECT: 90.8 mg/dL

## 2014-06-22 LAB — LIPID PANEL
CHOLESTEROL: 185 mg/dL (ref 0–200)
HDL: 32.6 mg/dL — AB (ref >39.00)
NonHDL: 152.4
Total CHOL/HDL Ratio: 6
Triglycerides: 361 mg/dL — ABNORMAL HIGH (ref 0.0–149.0)
VLDL: 72.2 mg/dL — AB (ref 0.0–40.0)

## 2014-06-22 LAB — TSH: TSH: 0.55 u[IU]/mL (ref 0.35–4.50)

## 2014-06-22 LAB — HEMOGLOBIN A1C: Hgb A1c MFr Bld: 5.9 % (ref 4.6–6.5)

## 2014-06-22 NOTE — Progress Notes (Signed)
Subjective:    Patient ID: Laura [name], female    DOB: 1956/04/30, 58 y.o.   MRN: [id number]  HPI Pt is here for regular wellness examination, and is feeling pretty well in general, and says chronic med probs are stable, except as noted below Past Medical History  Diagnosis Date  . ESOPHAGEAL STRICTURE 02/17/2008  . ALLERGIC RHINITIS 01/25/2008  . GERD 01/25/2008  . ASTHMA 01/25/2008  . Unspecified disorder of liver 01/19/2008  . HYPERGLYCEMIA 01/19/2008  . RECTAL BLEEDING 01/04/2008  . 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 METATARSALGIA 11/08/2007  . Tibialis tendinitis 05/13/2010  . CONSTIPATION 02/21/2008  . VENTRICULAR HYPERTROPHY, LEFT 09/23/2010  . HYPERTENSION 08/05/2010  . ABNORMAL ELECTROCARDIOGRAM 08/05/2010  . ACNE, MILD 02/17/2008  . Impaired glucose tolerance 01/11/2011  . PONV (postoperative nausea and vomiting)   . Wears glasses     Past Surgical History  Procedure Laterality Date  . Esophagogastroduodenoscopy  04/01/1998  . Nasal sinus surgery      s/p  . Ovarian cyst removal    . Arthroscopic repair acl  10/1998    Left knee   . US echocardiography  2011  . Cardiovascular stress test  2011  . Orif elbow fracture  12/23/2011    Procedure: OPEN REDUCTION INTERNAL FIXATION (ORIF) ELBOW/OLECRANON FRACTURE;  Surgeon: Linna Hoff, MD;  Location: [facility];  Service: Orthopedics;  Laterality: Left;  LEFT ELBOW ORIF OF PROXIMAL OLECRANON AND RADIAL HEAD, POSSIBLE ARTHROPLASTY  . Elbow arthroplasty  2014    left  . Carpal tunnel release  2014    left  . Abdominal hysterectomy  2002    TAH-LTSO-LOA  . Dilation and curettage of uterus    . Cholecystectomy  1985    History   Social History  . Marital Status: Married    Spouse Name: N/A    Number of Children: N/A  . Years of Education: N/A   Occupational History  . Not on file.   Social History Main Topics  . Smoking status: Never Smoker   . Smokeless tobacco: Not on file  . Alcohol Use: No  . Drug Use:  No  . Sexual Activity: Not on file   Other Topics Concern  . Not on file   Social History Narrative   Pt does not get regular exercise.   Daily Caffeine Use (12 oz Coke Zero per day)    Current Outpatient Prescriptions on File Prior to Visit  Medication Sig Dispense Refill  . aspirin EC 81 MG tablet Take 81 mg by mouth daily.      . Calcium Carbonate-Vitamin D (CALTRATE 600+D) 600-400 MG-UNIT per tablet Take 2 tablets by mouth daily.       . Fiber CHEW Chew 2 tablets by mouth daily.      . Fluticasone-Salmeterol (ADVAIR DISKUS) 100-50 MCG/DOSE AEPB Inhale 1 puff into the lungs daily as needed. For shortness of breath.      . lansoprazole (PREVACID) 30 MG capsule Take 30 mg by mouth daily as needed. For reflux.      Marland Kitchen losartan (COZAAR) 25 MG tablet Take 1 tablet (25 mg total) by mouth daily.  90 tablet  3  . meclizine (ANTIVERT) 12.5 MG tablet Take 1 tablet (12.5 mg total) by mouth 3 (three) times daily as needed for dizziness.  30 tablet  4  . Multiple Vitamin (MULTIVITAMIN) tablet Take 1 tablet by mouth daily.        . vitamin E (VITAMIN E)  400 UNIT capsule Take 400 Units by mouth daily.        . [DISCONTINUED] olmesartan (BENICAR) 20 MG tablet Take 20 mg by mouth daily.         No current facility-administered medications on file prior to visit.    No Known Allergies  Family History  Problem Relation Age of Onset  . Diabetes Mother   . Thyroid disease Mother     hypothyroidism  . Cancer Other     Pancreatic Cancer-Aunt    BP 118/86  Pulse 71  Temp(Src) 98.7 F (37.1 C) (Oral)  Ht 5\' 7"  (1.702 m)  Wt 180 lb (81.647 kg)  BMI 28.19 kg/m2  SpO2 97%     Review of Systems  Constitutional: Negative for fever.  HENT: Negative for hearing loss.   Eyes: Negative for visual disturbance.  Respiratory: Negative for shortness of breath.   Cardiovascular: Negative for chest pain.  Gastrointestinal: Negative for anal bleeding.  Endocrine: Negative for cold intolerance.    Genitourinary: Negative for hematuria.  Musculoskeletal: Negative for back pain.  Skin: Negative for rash.  Allergic/Immunologic: Positive for environmental allergies.  Neurological: Negative for syncope and headaches.  Hematological: Does not bruise/bleed easily.  Psychiatric/Behavioral: Negative for dysphoric mood.       Objective:   Physical Exam VS: see vs page GEN: no distress HEAD: head: no deformity eyes: no periorbital swelling, no proptosis external nose and ears are normal mouth: no lesion seen NECK: supple, thyroid is not enlarged CHEST WALL: no deformity LUNGS:  Clear to auscultation BREASTS: sees gyn. ABD: abdomen is soft, nontender.  no hepatosplenomegaly.  not distended.  no hernia GENITALIA/RECTAL: sees gyn MUSCULOSKELETAL: muscle bulk and strength are grossly normal.  no obvious joint swelling.  gait is normal and steady EXTEMITIES: no deformity.  no ulcer on the feet.  feet are of normal color and temp.  no edema PULSES: dorsalis pedis intact bilat.  no carotid bruit NEURO:  cn 2-12 grossly intact.   readily moves all 4's.  sensation is intact to touch on the feet SKIN:  Normal texture and temperature.  No rash or suspicious lesion is visible.   NODES:  None palpable at the neck PSYCH: alert, well-oriented.  Does not appear anxious nor depressed.        Assessment & Plan:    Patient is advised the following: Patient Instructions  blood tests are being requested for you today.  We'll contact you with results. please consider these measures for your health:  minimize alcohol.  do not use tobacco products.  have a colonoscopy at least every 10 years from age 62.  Women should have an annual mammogram from age 17.  keep firearms safely stored.  always use seat belts.  have working smoke alarms in your home.  see an eye doctor and dentist regularly.  never drive under the influence of alcohol or drugs (including prescription drugs).  those with fair skin  should take precautions against the sun.  Please return in 1 year.     SEPARATE EVALUATION FOLLOWS--EACH PROBLEM HERE IS NEW, NOT RESPONDING TO TREATMENT, OR POSES SIGNIFICANT RISK TO THE PATIENT'S HEALTH: HISTORY OF THE PRESENT ILLNESS: Elevated TG are noted today.  She denies weight change.   PAST MEDICAL HISTORY reviewed and up to date today.  REVIEW OF SYSTEMS: Denies numbness PHYSICAL EXAMINATION: VITAL SIGNS:  See vs page GENERAL: no distress HEART:  Regular rate and rhythm without murmurs noted. Normal S1,S2.   LAB/XRAY RESULTS:  i reviewed electrocardiogram.  Lab Results  Component Value Date   CHOL 185 06/22/2014   HDL 32.60* 06/22/2014   LDLCALC 59 11/30/2011   LDLDIRECT 90.8 06/22/2014   TRIG 361.0* 06/22/2014   CHOLHDL 6 06/22/2014  IMPRESSION: Hypertriglyceridemia, worse.  Although this is not a fasting test, she needs rx for this. PLAN:  i advised pt to consider rx

## 2014-06-22 NOTE — Patient Instructions (Signed)
blood tests are being requested for you today.  We'll contact you with results. please consider these measures for your health:  minimize alcohol.  do not use tobacco products.  have a colonoscopy at least every 10 years from age 57.  Women should have an annual mammogram from age 34.  keep firearms safely stored.  always use seat belts.  have working smoke alarms in your home.  see an eye doctor and dentist regularly.  never drive under the influence of alcohol or drugs (including prescription drugs).  those with fair skin should take precautions against the sun.  Please return in 1 year.

## 2014-06-25 LAB — PTH, INTACT AND CALCIUM
CALCIUM: 9.5 mg/dL (ref 8.4–10.5)
PTH: 23 pg/mL (ref 14–64)

## 2014-06-28 ENCOUNTER — Other Ambulatory Visit: Payer: Self-pay | Admitting: Dermatology

## 2014-07-27 ENCOUNTER — Encounter: Payer: Self-pay | Admitting: Endocrinology

## 2014-07-27 ENCOUNTER — Ambulatory Visit (INDEPENDENT_AMBULATORY_CARE_PROVIDER_SITE_OTHER): Payer: BC Managed Care – PPO | Admitting: Endocrinology

## 2014-07-27 VITALS — BP 124/86 | HR 82 | Temp 98.1°F | Ht 67.0 in | Wt 177.0 lb

## 2014-07-27 DIAGNOSIS — J45909 Unspecified asthma, uncomplicated: Secondary | ICD-10-CM

## 2014-07-27 MED ORDER — CEFUROXIME AXETIL 250 MG PO TABS: 250.0000 mg | ORAL_TABLET | Freq: Two times a day (BID) | ORAL | Status: AC

## 2014-07-27 MED ORDER — PROMETHAZINE-CODEINE 6.25-10 MG/5ML PO SYRP: 5.0000 mL | ORAL_SOLUTION | ORAL | Status: DC | PRN

## 2014-07-27 MED ORDER — METHYLPREDNISOLONE (PAK) 4 MG PO TABS: ORAL_TABLET | ORAL | Status: DC

## 2014-07-27 NOTE — Progress Notes (Signed)
Subjective:    Patient ID: Sande Brothers, female    DOB: 20-Sep-1955, 58 y.o.   MRN: [id number]  HPI Pt states few days of slight pain at the throat, and assoc bilat earache.  She has slight wheezing and nasal congestion.   Past Medical History  Diagnosis Date  . ESOPHAGEAL STRICTURE 02/17/2008  . ALLERGIC RHINITIS 01/25/2008  . GERD 01/25/2008  . ASTHMA 01/25/2008  . Unspecified disorder of liver 01/19/2008  . HYPERGLYCEMIA 01/19/2008  . RECTAL BLEEDING 01/04/2008  . 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 METATARSALGIA 11/08/2007  . Tibialis tendinitis 05/13/2010  . CONSTIPATION 02/21/2008  . VENTRICULAR HYPERTROPHY, LEFT 09/23/2010  . HYPERTENSION 08/05/2010  . ABNORMAL ELECTROCARDIOGRAM 08/05/2010  . ACNE, MILD 02/17/2008  . Impaired glucose tolerance 01/11/2011  . PONV (postoperative nausea and vomiting)   . Wears glasses     Past Surgical History  Procedure Laterality Date  . Esophagogastroduodenoscopy  04/01/1998  . Nasal sinus surgery      s/p  . Ovarian cyst removal    . Arthroscopic repair acl  10/1998    Left knee   . US echocardiography  2011  . Cardiovascular stress test  2011  . Orif elbow fracture  12/23/2011    Procedure: OPEN REDUCTION INTERNAL FIXATION (ORIF) ELBOW/OLECRANON FRACTURE;  Surgeon: Linna Hoff, MD;  Location: [facility];  Service: Orthopedics;  Laterality: Left;  LEFT ELBOW ORIF OF PROXIMAL OLECRANON AND RADIAL HEAD, POSSIBLE ARTHROPLASTY  . Elbow arthroplasty  2014    left  . Carpal tunnel release  2014    left  . Abdominal hysterectomy  2002    TAH-LTSO-LOA  . Dilation and curettage of uterus    . Cholecystectomy  1985    History   Social History  . Marital Status: Married    Spouse Name: N/A    Number of Children: N/A  . Years of Education: N/A   Occupational History  . Not on file.   Social History Main Topics  . Smoking status: Never Smoker   . Smokeless tobacco: Not on file  . Alcohol Use: No  . Drug Use: No  . Sexual  Activity: Not on file   Other Topics Concern  . Not on file   Social History Narrative   Pt does not get regular exercise.   Daily Caffeine Use (12 oz Coke Zero per day)    Current Outpatient Prescriptions on File Prior to Visit  Medication Sig Dispense Refill  . aspirin EC 81 MG tablet Take 81 mg by mouth daily.    . Calcium Carbonate-Vitamin D (CALTRATE 600+D) 600-400 MG-UNIT per tablet Take 2 tablets by mouth daily.     . Fiber CHEW Chew 2 tablets by mouth daily.    . Fluticasone-Salmeterol (ADVAIR DISKUS) 100-50 MCG/DOSE AEPB Inhale 1 puff into the lungs daily as needed. For shortness of breath.    . lansoprazole (PREVACID) 30 MG capsule Take 30 mg by mouth daily as needed. For reflux.    Marland Kitchen losartan (COZAAR) 25 MG tablet Take 1 tablet (25 mg total) by mouth daily. 90 tablet 3  . meclizine (ANTIVERT) 12.5 MG tablet Take 1 tablet (12.5 mg total) by mouth 3 (three) times daily as needed for dizziness. 30 tablet 4  . Multiple Vitamin (MULTIVITAMIN) tablet Take 1 tablet by mouth daily.      . vitamin E (VITAMIN E) 400 UNIT capsule Take 400 Units by mouth daily.      . [DISCONTINUED] olmesartan (  BENICAR) 20 MG tablet Take 20 mg by mouth daily.       No current facility-administered medications on file prior to visit.    No Known Allergies  Family History  Problem Relation Age of Onset  . Diabetes Mother   . Thyroid disease Mother     hypothyroidism  . Cancer Other     Pancreatic Cancer-Aunt    BP 124/86 mmHg  Pulse 82  Temp(Src) 98.1 F (36.7 C) (Oral)  Ht 5\' 7"  (1.702 m)  Wt 177 lb (80.287 kg)  BMI 27.72 kg/m2  SpO2 98%    Review of Systems She has slight prod cough, but no sob or fever.    Objective:   Physical Exam negative head: no deformity eyes: no periorbital swelling, no proptosis external nose and ears are normal mouth: no lesion seen.   Both both tm's are slightly red   LUNGS:  Clear to auscultation    (i reviewed 9/15 CXR result)  i have  reviewed the following old records: Office notes from [employer] allergy: she is rx'ed for mild asthma    Assessment & Plan:  URI, new Asthma, very mild exacerbation   Patient is advised the following: Patient Instructions  i have sent 2 prescriptions to your pharmacy: for an antibiotic pill, and a steroid "pack." Here is a prescription for cough syrup. Loratadine-d (non-prescription) will help your congestion. I hope you feel better soon.  If you don't feel better by next week, please call back.  Please call sooner if you get worse.

## 2014-07-27 NOTE — Patient Instructions (Addendum)
i have sent 2 prescriptions to your pharmacy: for an antibiotic pill, and a steroid "pack." Here is a prescription for cough syrup. Loratadine-d (non-prescription) will help your congestion. I hope you feel better soon.  If you don't feel better by next week, please call back.  Please call sooner if you get worse.

## 2014-08-08 ENCOUNTER — Other Ambulatory Visit: Payer: Self-pay | Admitting: Endocrinology

## 2014-12-21 ENCOUNTER — Telehealth: Payer: Self-pay | Admitting: Endocrinology

## 2014-12-21 MED ORDER — CYCLOBENZAPRINE HCL 10 MG PO TABS: 10.0000 mg | ORAL_TABLET | Freq: Three times a day (TID) | ORAL | Status: DC | PRN

## 2014-12-21 NOTE — Telephone Encounter (Signed)
Requested call back from pt to discuss. Left voicemail on both numbers.

## 2014-12-21 NOTE — Telephone Encounter (Signed)
Pt has been having muscle spasms since 5 am today continuous can we refill her flexeril

## 2014-12-21 NOTE — Telephone Encounter (Signed)
Pt notified that rx has been sent.

## 2014-12-21 NOTE — Telephone Encounter (Signed)
please call patient: i need to know: is this back back spasm?  Is this similar to what you have had before?

## 2014-12-21 NOTE — Addendum Note (Signed)
Addended by: Renato Shin on: 12/21/2014 04:42 PM   Modules accepted: Orders

## 2014-12-21 NOTE — Telephone Encounter (Signed)
See note below and please advise, Thanks! 

## 2014-12-21 NOTE — Telephone Encounter (Signed)
Pt called back stating the muscle spasms are around her rip cage area and she confirmed it is the same issue she has had before.

## 2014-12-21 NOTE — Telephone Encounter (Signed)
Ok, i have sent a prescription to your pharmacy  

## 2014-12-24 ENCOUNTER — Telehealth: Payer: Self-pay | Admitting: Endocrinology

## 2014-12-24 NOTE — Telephone Encounter (Signed)
[facility] note dated 12/21/14 at 710 pm received from Neurology:  April from [facility] for script. Medication name not given.

## 2014-12-24 NOTE — Telephone Encounter (Signed)
Called pt and left a voicemail wanting to verify if she was able to pick up her Flexeril. Pt was advised to call back if she was unable to pick up the RX.

## 2015-02-19 ENCOUNTER — Encounter: Payer: Self-pay | Admitting: Endocrinology

## 2015-02-19 ENCOUNTER — Ambulatory Visit (INDEPENDENT_AMBULATORY_CARE_PROVIDER_SITE_OTHER): Payer: BLUE CROSS/BLUE SHIELD | Admitting: Endocrinology

## 2015-02-19 VITALS — BP 112/70 | HR 75 | Ht 67.0 in | Wt 179.0 lb

## 2015-02-19 DIAGNOSIS — Z01810 Encounter for preprocedural cardiovascular examination: Secondary | ICD-10-CM

## 2015-02-19 NOTE — Progress Notes (Signed)
Subjective:    Patient ID: Laura [name], female    DOB: Feb 18, 1956, 59 y.o.   MRN: [id number]  HPI  Pt is here to f/u: HTN: she denies chest pain.  Asthma: she denies sob. Past Medical History  Diagnosis Date  . ESOPHAGEAL STRICTURE 02/17/2008  . ALLERGIC RHINITIS 01/25/2008  . GERD 01/25/2008  . ASTHMA 01/25/2008  . Unspecified disorder of liver 01/19/2008  . HYPERGLYCEMIA 01/19/2008  . RECTAL BLEEDING 01/04/2008  . 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 METATARSALGIA 11/08/2007  . Tibialis tendinitis 05/13/2010  . CONSTIPATION 02/21/2008  . VENTRICULAR HYPERTROPHY, LEFT 09/23/2010  . HYPERTENSION 08/05/2010  . ABNORMAL ELECTROCARDIOGRAM 08/05/2010  . ACNE, MILD 02/17/2008  . Impaired glucose tolerance 01/11/2011  . PONV (postoperative nausea and vomiting)   . Wears glasses     Past Surgical History  Procedure Laterality Date  . Esophagogastroduodenoscopy  04/01/1998  . Nasal sinus surgery      s/p  . Ovarian cyst removal    . Arthroscopic repair acl  10/1998    Left knee   . US echocardiography  2011  . Cardiovascular stress test  2011  . Orif elbow fracture  12/23/2011    Procedure: OPEN REDUCTION INTERNAL FIXATION (ORIF) ELBOW/OLECRANON FRACTURE;  Surgeon: Linna Hoff, MD;  Location: [facility];  Service: Orthopedics;  Laterality: Left;  LEFT ELBOW ORIF OF PROXIMAL OLECRANON AND RADIAL HEAD, POSSIBLE ARTHROPLASTY  . Elbow arthroplasty  2014    left  . Carpal tunnel release  2014    left  . Abdominal hysterectomy  2002    TAH-LTSO-LOA  . Dilation and curettage of uterus    . Cholecystectomy  1985    History   Social History  . Marital Status: Married    Spouse Name: N/A  . Number of Children: N/A  . Years of Education: N/A   Occupational History  . Not on file.   Social History Main Topics  . Smoking status: Never Smoker   . Smokeless tobacco: Not on file  . Alcohol Use: No  . Drug Use: No  . Sexual Activity: Not on file   Other Topics Concern  . Not  on file   Social History Narrative   Pt does not get regular exercise.   Daily Caffeine Use (12 oz Coke Zero per day)    Current Outpatient Prescriptions on File Prior to Visit  Medication Sig Dispense Refill  . aspirin EC 81 MG tablet Take 81 mg by mouth daily.    . Calcium Carbonate-Vitamin D (CALTRATE 600+D) 600-400 MG-UNIT per tablet Take 2 tablets by mouth daily.     . cyclobenzaprine (FLEXERIL) 10 MG tablet Take 1 tablet (10 mg total) by mouth 3 (three) times daily as needed for muscle spasms. 30 tablet 0  . Fiber CHEW Chew 2 tablets by mouth daily.    . Fluticasone-Salmeterol (ADVAIR DISKUS) 100-50 MCG/DOSE AEPB Inhale 1 puff into the lungs daily as needed. For shortness of breath.    . lansoprazole (PREVACID) 30 MG capsule Take 30 mg by mouth daily as needed. For reflux.    Marland Kitchen losartan (COZAAR) 25 MG tablet TAKE 1 TABLET (25 MG TOTAL) BY MOUTH DAILY. 90 tablet 3  . meclizine (ANTIVERT) 12.5 MG tablet Take 1 tablet (12.5 mg total) by mouth 3 (three) times daily as needed for dizziness. 30 tablet 4  . Multiple Vitamin (MULTIVITAMIN) tablet Take 1 tablet by mouth daily.      . vitamin E (VITAMIN  E) 400 UNIT capsule Take 400 Units by mouth daily.      . [DISCONTINUED] olmesartan (BENICAR) 20 MG tablet Take 20 mg by mouth daily.       No current facility-administered medications on file prior to visit.    No Known Allergies  Family History  Problem Relation Age of Onset  . Diabetes Mother   . Thyroid disease Mother     hypothyroidism  . Cancer Other     Pancreatic Cancer-Aunt    BP 112/70 mmHg  Pulse 75  Ht 5\' 7"  (1.702 m)  Wt 179 lb (81.194 kg)  BMI 28.03 kg/m2  SpO2 97%   Review of Systems Denies weight change    Objective:   Physical Exam VITAL SIGNS:  See vs page.  GENERAL: no distress.   LUNGS:  Clear to auscultation.   HEART:  Regular rate and rhythm without murmurs noted. Normal S1,S2.     i personally reviewed electrocardiogram tracing: normal      Assessment & Plan:  HTN: well-controlled Asthma: well-controlled    Patient is advised the following: Patient Instructions  i'll do the form for your surgery. Please continue the same medications.

## 2015-02-19 NOTE — Patient Instructions (Signed)
i'll do the form for your surgery. Please continue the same medications.

## 2015-02-22 ENCOUNTER — Other Ambulatory Visit: Payer: Self-pay

## 2015-02-22 DIAGNOSIS — Z1231 Encounter for screening mammogram for malignant neoplasm of breast: Secondary | ICD-10-CM

## 2015-04-16 ENCOUNTER — Other Ambulatory Visit: Payer: Self-pay | Admitting: Obstetrics and Gynecology

## 2015-04-17 LAB — CYTOLOGY - PAP

## 2015-04-18 ENCOUNTER — Encounter: Payer: Self-pay | Admitting: Gastroenterology

## 2015-05-21 ENCOUNTER — Ambulatory Visit
Admission: RE | Admit: 2015-05-21 | Discharge: 2015-05-21 | Disposition: A | Discharge status: Home / Self Care | Payer: BLUE CROSS/BLUE SHIELD | Source: Ambulatory Visit

## 2015-05-21 DIAGNOSIS — Z1231 Encounter for screening mammogram for malignant neoplasm of breast: Secondary | ICD-10-CM

## 2015-05-23 ENCOUNTER — Ambulatory Visit: Payer: Self-pay | Admitting: Orthopedic Surgery

## 2015-05-23 NOTE — Progress Notes (Signed)
Preoperative surgical orders have been place into the Epic hospital system for Laura [name] on 05/23/2015, 9:33 AM  by Mickel Crow for surgery on 06-17-2015.  Preop Total Knee orders including Experal, IV Tylenol, and IV Decadron as long as there are no contraindications to the above medications. Arlee Muslim, PA-C

## 2015-06-14 NOTE — Patient Instructions (Addendum)
[place]  06/14/2015   Your procedure is scheduled on:   06-24-2015 Monday  Enter through [facility]  Entrance and follow signs to [facility]. Arrive at  0730      AM .  (Limit 1 person with you).  Call this number if you have problems the morning of surgery: [phone]  Or Presurgical Testing [phone].   For Living Will and/or Health Care Power Attorney Forms: please provide copy for your medical record,may bring AM of surgery(Forms should be already notarized -we do not provide this service).(06-18-15  No information preferred today).     Do not eat food/ or drink: After Midnight.      Take these medicines the morning of surgery with A SIP OF WATER-   (DO NOT TAKE ANY DIABETIC MEDS AM OF SURGERY) : Prevacid. Use/bring Inhalers.   Do not wear jewelry, make-up or nail polish.  Do not wear deodorant, lotions, powders, or perfumes.   Do not shave legs and under arms- 48 hours(2 days) prior to first CHG shower.(Shaving face and neck okay.)  Do not bring valuables to the hospital.(Hospital is not responsible for lost valuables).  Contacts, dentures or removable bridgework, body piercing, hair pins may not be worn into surgery.  Leave suitcase in the car. After surgery it may be brought to your room.  For patients admitted to the hospital, checkout time is 11:00 AM the day of discharge.(Restricted visitors-Any Persons displaying flu-like symptoms or illness).    Patients discharged the day of surgery will not be allowed to drive home. Must have responsible person with you x 24 hours once discharged.  Name and phone number of your driver:  Laura [name] -spouse [phone] cell    Please read over the following fact sheets that you were given:  CHG(Chlorhexidine Gluconate 4% Surgical Soap) use, MRSA Information, Blood Transfusion fact sheet, Incentive Spirometry Instruction.  Remember : Type/Screen "Blue armbands" - may not be removed once applied(would result in  being retested AM of surgery, if removed).         Laura [name] - Preparing for Surgery Before surgery, you can play an important role.  Because skin is not sterile, your skin needs to be as free of germs as possible.  You can reduce the number of germs on your skin by washing with CHG (chlorahexidine gluconate) soap before surgery.  CHG is an antiseptic cleaner which kills germs and bonds with the skin to continue killing germs even after washing. Please DO NOT use if you have an allergy to CHG or antibacterial soaps.  If your skin becomes reddened/irritated stop using the CHG and inform your nurse when you arrive at Short Stay. Do not shave (including legs and underarms) for at least 48 hours prior to the first CHG shower.  You may shave your face/neck. Please follow these instructions carefully:  1.  Shower with CHG Soap the night before surgery and the  morning of Surgery.  2.  If you choose to wash your hair, wash your hair first as usual with your  normal  shampoo.  3.  After you shampoo, rinse your hair and body thoroughly to remove the  shampoo.                           4.  Use CHG as you would any other liquid soap.  You can apply chg directly  to the skin and wash  Gently with a scrungie or clean washcloth.  5.  Apply the CHG Soap to your body ONLY FROM THE NECK DOWN.   Do not use on face/ open                           Wound or open sores. Avoid contact with eyes, ears mouth and genitals (private parts).                       Wash face,  Genitals (private parts) with your normal soap.             6.  Wash thoroughly, paying special attention to the area where your surgery  will be performed.  7.  Thoroughly rinse your body with warm water from the neck down.  8.  DO NOT shower/wash with your normal soap after using and rinsing off  the CHG Soap.                9.  Pat yourself dry with a clean towel.            10.  Wear clean pajamas.            11.  Place clean  sheets on your bed the night of your first shower and do not  sleep with pets. Day of Surgery : Do not apply any lotions/deodorants the morning of surgery.  Please wear clean clothes to the hospital/surgery center.  FAILURE TO FOLLOW THESE INSTRUCTIONS MAY RESULT IN THE CANCELLATION OF YOUR SURGERY PATIENT SIGNATURE_________________________________  NURSE SIGNATURE__________________________________  ________________________________________________________________________   Laura [name]  An incentive spirometer is a tool that can help keep your lungs clear and active. This tool measures how well you are filling your lungs with each breath. Taking long deep breaths may help reverse or decrease the chance of developing breathing (pulmonary) problems (especially infection) following:  A long period of time when you are unable to move or be active. BEFORE THE PROCEDURE   If the spirometer includes an indicator to show your best effort, your nurse or respiratory therapist will set it to a desired goal.  If possible, sit up straight or lean slightly forward. Try not to slouch.  Hold the incentive spirometer in an upright position. INSTRUCTIONS FOR USE   Sit on the edge of your bed if possible, or sit up as far as you can in bed or on a chair.  Hold the incentive spirometer in an upright position.  Breathe out normally.  Place the mouthpiece in your mouth and seal your lips tightly around it.  Breathe in slowly and as deeply as possible, raising the piston or the ball toward the top of the column.  Hold your breath for 3-5 seconds or for as long as possible. Allow the piston or ball to fall to the bottom of the column.  Remove the mouthpiece from your mouth and breathe out normally.  Rest for a few seconds and repeat Steps 1 through 7 at least 10 times every 1-2 hours when you are awake. Take your time and take a few normal breaths between deep breaths.  The spirometer may  include an indicator to show your best effort. Use the indicator as a goal to work toward during each repetition.  After each set of 10 deep breaths, practice coughing to be sure your lungs are clear. If you have an incision (the cut made at the time of  surgery), support your incision when coughing by placing a pillow or rolled up towels firmly against it. Once you are able to get out of bed, walk around indoors and cough well. You may stop using the incentive spirometer when instructed by your caregiver.  RISKS AND COMPLICATIONS  Take your time so you do not get dizzy or light-headed.  If you are in pain, you may need to take or ask for pain medication before doing incentive spirometry. It is harder to take a deep breath if you are having pain. AFTER USE  Rest and breathe slowly and easily.  It can be helpful to keep track of a log of your progress. Your caregiver can provide you with a simple table to help with this. If you are using the spirometer at home, follow these instructions: [place] IF:   You are having difficultly using the spirometer.  You have trouble using the spirometer as often as instructed.  Your pain medication is not giving enough relief while using the spirometer.  You develop fever of 100.5 F (38.1 C) or higher. SEEK IMMEDIATE MEDICAL CARE IF:   You cough up bloody sputum that had not been present before.  You develop fever of 102 F (38.9 C) or greater.  You develop worsening pain at or near the incision site. MAKE SURE YOU:   Understand these instructions.  Will watch your condition.  Will get help right away if you are not doing well or get worse. Document Released: 01/11/2007 Document Revised: 11/23/2011 Document Reviewed: 03/14/2007 ExitCare Patient Information 2014 ExitCare, [place].   ________________________________________________________________________  WHAT IS A BLOOD TRANSFUSION? Blood Transfusion Information  A transfusion  is the replacement of blood or some of its parts. Blood is made up of multiple cells which provide different functions.  Red blood cells carry oxygen and are used for blood loss replacement.  White blood cells fight against infection.  Platelets control bleeding.  Plasma helps clot blood.  Other blood products are available for specialized needs, such as hemophilia or other clotting disorders. BEFORE THE TRANSFUSION  Who gives blood for transfusions?   Healthy volunteers who are fully evaluated to make sure their blood is safe. This is blood bank blood. Transfusion therapy is the safest it has ever been in the practice of medicine. Before blood is taken from a donor, a complete history is taken to make sure that person has no history of diseases nor engages in risky social behavior (examples are intravenous drug use or sexual activity with multiple partners). The donor's travel history is screened to minimize risk of transmitting infections, such as malaria. The donated blood is tested for signs of infectious diseases, such as HIV and hepatitis. The blood is then tested to be sure it is compatible with you in order to minimize the chance of a transfusion reaction. If you or a relative donates blood, this is often done in anticipation of surgery and is not appropriate for emergency situations. It takes many days to process the donated blood. RISKS AND COMPLICATIONS Although transfusion therapy is very safe and saves many lives, the main dangers of transfusion include:   Getting an infectious disease.  Developing a transfusion reaction. This is an allergic reaction to something in the blood you were given. Every precaution is taken to prevent this. The decision to have a blood transfusion has been considered carefully by your caregiver before blood is given. Blood is not given unless the benefits outweigh the risks. AFTER THE TRANSFUSION  Right after receiving a blood transfusion, you will  usually feel much better and more energetic. This is especially true if your red blood cells have gotten low (anemic). The transfusion raises the level of the red blood cells which carry oxygen, and this usually causes an energy increase.  The nurse administering the transfusion will monitor you carefully for complications. HOME CARE INSTRUCTIONS  No special instructions are needed after a transfusion. You may find your energy is better. Speak with your caregiver about any limitations on activity for underlying diseases you may have. SEEK MEDICAL CARE IF:   Your condition is not improving after your transfusion.  You develop redness or irritation at the intravenous (IV) site. SEEK IMMEDIATE MEDICAL CARE IF:  Any of the following symptoms occur over the next 12 hours:  Shaking chills.  You have a temperature by mouth above 102 F (38.9 C), not controlled by medicine.  Chest, back, or muscle pain.  People around you feel you are not acting correctly or are confused.  Shortness of breath or difficulty breathing.  Dizziness and fainting.  You get a rash or develop hives.  You have a decrease in urine output.  Your urine turns a dark color or changes to pink, red, or brown. Any of the following symptoms occur over the next 10 days:  You have a temperature by mouth above 102 F (38.9 C), not controlled by medicine.  Shortness of breath.  Weakness after normal activity.  The white part of the eye turns yellow (jaundice).  You have a decrease in the amount of urine or are urinating less often.  Your urine turns a dark color or changes to pink, red, or brown. Document Released: 08/28/2000 Document Revised: 11/23/2011 Document Reviewed: 04/16/2008 [facility] Patient Information 2014 [place], [place].  _______________________________________________________________________

## 2015-06-18 ENCOUNTER — Encounter (HOSPITAL_COMMUNITY)
Admission: RE | Admit: 2015-06-18 | Discharge: 2015-06-18 | Disposition: A | Discharge status: Home / Self Care | Payer: BLUE CROSS/BLUE SHIELD | Source: Ambulatory Visit | Attending: Orthopedic Surgery | Admitting: Orthopedic Surgery

## 2015-06-18 ENCOUNTER — Encounter (HOSPITAL_COMMUNITY): Payer: Self-pay

## 2015-06-18 DIAGNOSIS — M179 Osteoarthritis of knee, unspecified: Secondary | ICD-10-CM | POA: Insufficient documentation

## 2015-06-18 DIAGNOSIS — Z01818 Encounter for other preprocedural examination: Secondary | ICD-10-CM | POA: Insufficient documentation

## 2015-06-18 HISTORY — DX: Enthesopathy, unspecified: M77.9

## 2015-06-18 LAB — URINALYSIS, ROUTINE W REFLEX MICROSCOPIC
Bilirubin Urine: NEGATIVE
GLUCOSE, UA: NEGATIVE mg/dL
Hgb urine dipstick: NEGATIVE
KETONES UR: NEGATIVE mg/dL
LEUKOCYTES UA: NEGATIVE
Nitrite: NEGATIVE
PH: 5.5 (ref 5.0–8.0)
Protein, ur: NEGATIVE mg/dL
SPECIFIC GRAVITY, URINE: 1.008 (ref 1.005–1.030)
Urobilinogen, UA: 0.2 mg/dL (ref 0.0–1.0)

## 2015-06-18 LAB — COMPREHENSIVE METABOLIC PANEL
ALT: 32 U/L (ref 14–54)
AST: 32 U/L (ref 15–41)
Albumin: 4.5 g/dL (ref 3.5–5.0)
Alkaline Phosphatase: 89 U/L (ref 38–126)
Anion gap: 9 (ref 5–15)
BUN: 10 mg/dL (ref 6–20)
CHLORIDE: 108 mmol/L (ref 101–111)
CO2: 25 mmol/L (ref 22–32)
CREATININE: 0.48 mg/dL (ref 0.44–1.00)
Calcium: 9.4 mg/dL (ref 8.9–10.3)
GFR calc Af Amer: >60 mL/min (ref >60)
Glucose, Bld: 103 mg/dL — ABNORMAL HIGH (ref 65–99)
Potassium: 3.8 mmol/L (ref 3.5–5.1)
Sodium: 142 mmol/L (ref 135–145)
Total Bilirubin: 0.6 mg/dL (ref 0.3–1.2)
Total Protein: 7.6 g/dL (ref 6.5–8.1)

## 2015-06-18 LAB — CBC
HCT: 41.2 % (ref 36.0–46.0)
Hemoglobin: 13.6 g/dL (ref 12.0–15.0)
MCH: 29.7 pg (ref 26.0–34.0)
MCHC: 33 g/dL (ref 30.0–36.0)
MCV: 90 fL (ref 78.0–100.0)
PLATELETS: 278 10*3/uL (ref 150–400)
RBC: 4.58 MIL/uL (ref 3.87–5.11)
RDW: 13.5 % (ref 11.5–15.5)
WBC: 7 10*3/uL (ref 4.0–10.5)

## 2015-06-18 LAB — PROTIME-INR
INR: 1.01 (ref 0.00–1.49)
PROTHROMBIN TIME: 13.5 s (ref 11.6–15.2)

## 2015-06-18 LAB — APTT: aPTT: 32 seconds (ref 24–37)

## 2015-06-18 LAB — SURGICAL PCR SCREEN
MRSA, PCR: INVALID — AB
Staphylococcus aureus: INVALID — AB

## 2015-06-18 LAB — ABO/RH: ABO/RH(D): A POS

## 2015-06-18 NOTE — Pre-Procedure Instructions (Signed)
EKG 02-19-15 Epic.

## 2015-06-20 LAB — MRSA CULTURE

## 2015-06-23 ENCOUNTER — Ambulatory Visit: Payer: Self-pay | Admitting: Orthopedic Surgery

## 2015-06-23 NOTE — H&P (Signed)
Laura [name] DOB: 04-08-1956 Married / Language: English / Race: White Female Date of Admission:  06/17/2015 CC:  Right Knee Pain History of Present Illness The patient is a 59 year old female who comes in for a preoperative History and Physical. The patient is scheduled for a right total knee arthroplasty to be performed by Dr. Dione Plover. Aluisio, MD at [facility] on 06-17-2015. Shw had been seen and treated by Dr. Wynelle Link for over 15 years with regard to her knee. She sustained a fall many years ago that eventually required her to have the ACL reconstructed in her right knee and had been having problems for quite some time. She was going to have her knee replaced a while by but unfortunately fell and sustained a lef elbox frature and injury which required several surgeries. She has since recovered form the elbow and now is ready to proceed with the right total knee replacement. They have been treated conservatively in the past for the above stated problem and despite conservative measures, they continue to have progressive pain and severe functional limitations and dysfunction. They have failed non-operative management including home exercise, medications, and injections. It is felt that they would benefit from undergoing total joint replacement. Risks and benefits of the procedure have been discussed with the patient and they elect to proceed with surgery. There are no active contraindications to surgery such as ongoing infection or rapidly progressive neurological disease.  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 Osteoarthrosis NOS, lower leg (715.96)06/20/2004  Allergies No Known Drug  Allergies  Family History  Chronic Obstructive Lung Disease father and brother Cerebrovascular Accident grandmother fathers side and grandfather fathers side Diabetes Mellitus mother  Social History Illicit drug use no Exercise Exercises rarely; does other, Exercises rarely; does running / walking Marital status married Living situation live with spouse Current work status working full time Children 1 Drug/Alcohol Rehab (Previously) no Drug/Alcohol Rehab (Currently) no Pain Contract no Tobacco / smoke exposure no Alcohol use never consumed alcohol Tobacco use never smoker  Medication History  Aspirin (81MG  Tablet, 1 (one) Oral) Active. Calcium/Vitamin D/Minerals (600-200MG -UNIT Tablet, 1 (one) Oral) Active. Fish Oil Concentrate (435MG  Capsule, 1 (one) Oral) Active. Losartan Potassium (25MG  Tablet, Oral) Active. Multiple Vitamin (1 (one) Oral) Active. Naprosyn (250MG  Tablet, Oral as needed) Active. Vitamin E (200UNIT Capsule, 1 (one) Oral) Active.  Past Surgical History  Colon Polyp Removal - Colonoscopy Arthroscopy of Knee bilateral Hysterectomy Date: 2001. partial (non-cancerous) partial (cancerous) Gallbladder Surgery open Straighten Nasal Septum Sinus Surgery Oophorectomy; Unilateral Left ACL Reconstruction - Right Date: 2000. Left Elbow Surgery 2013, 2014 Left Breast Lumpectomy Date: 2015. Benign   Review of Systems General Not Present- Chills, Fatigue, Fever, Memory Loss, Night Sweats, Weight Gain and Weight Loss. Skin Not Present- Eczema, Hives, Itching, Lesions and Rash. HEENT Not Present- Dentures, Double Vision, Headache, Hearing Loss, Tinnitus and Visual Loss. Respiratory Not Present- Allergies, Chronic Cough, Coughing up blood, Shortness of breath at rest and Shortness of breath with exertion. Cardiovascular Not Present- Chest Pain, Difficulty Breathing Lying Down, Murmur, Palpitations, Racing/skipping heartbeats and  Swelling. Gastrointestinal Not Present- Abdominal Pain, Bloody Stool, Constipation, Diarrhea, Difficulty Swallowing, Heartburn, Jaundice, Loss of appetitie, Nausea and Vomiting. Female Genitourinary Not Present- Blood in Urine, Discharge, Flank Pain, Incontinence, Painful Urination, Urgency, Urinary frequency, Urinary Retention,  Urinating at Night and Weak urinary stream. Musculoskeletal Present- Joint Pain. Not Present- Back Pain, Joint Swelling, Morning Stiffness, Muscle Pain, Muscle Weakness and Spasms. Neurological Not Present- Blackout spells, Difficulty with balance, Dizziness, Paralysis, Tremor and Weakness. Psychiatric Not Present- Insomnia.  Vitals  Weight: 178 lb Height: 67in Body Surface Area: 1.92 m Body Mass Index: 27.88 kg/m  BP: 142/80 (Sitting, Left Arm, Standard)   Physical Exam General Mental Status -Alert, cooperative and good historian. General Appearance-pleasant, Not in acute distress. Orientation-Oriented X3. Build & Nutrition-Well nourished and Well developed.  Head and Neck Head-normocephalic, atraumatic . Neck Global Assessment - supple, no bruit auscultated on the right, no bruit auscultated on the left.  Eye Pupil - Bilateral-Regular and Round. Motion - Bilateral-EOMI.  ENMT Note: lower partial denture plate   Chest and Lung Exam Auscultation Breath sounds - clear at anterior chest wall and clear at posterior chest wall. Adventitious sounds - No Adventitious sounds.  Cardiovascular Auscultation Rhythm - Regular rate and rhythm. Heart Sounds - S1 WNL and S2 WNL. Murmurs & Other Heart Sounds - Auscultation of the heart reveals - No Murmurs.  Abdomen Palpation/Percussion Tenderness - Abdomen is non-tender to palpation. Rigidity (guarding) - Abdomen is soft. Auscultation Auscultation of the abdomen reveals - Bowel sounds normal.  Female Genitourinary Note: Not done, not pertinent to present  illness   Musculoskeletal Note: Previous anterior ACL reconstruction incision well healed. ROM 7-115. Crepitus noted. No Effusion.  Assessment & Plan Primary osteoarthritis of right knee (M17.11) Note:Surgical Plans: Left Right Total Knee Hip Replacement - Anterior Approach  Disposition: Home  PCP: Dr. Renato Shin  IV TXA  Anesthesia Issues: Postop Nausea in the past  Signed electronically by Malinda Mayden Monika Salk, III PA-C

## 2015-06-24 ENCOUNTER — Inpatient Hospital Stay (HOSPITAL_COMMUNITY)
Admission: RE | Admit: 2015-06-24 | Discharge: 2015-06-26 | DRG: 470 | Disposition: A | Discharge status: Home Health | Payer: BLUE CROSS/BLUE SHIELD | Source: Ambulatory Visit | Attending: Orthopedic Surgery | Admitting: Orthopedic Surgery

## 2015-06-24 ENCOUNTER — Inpatient Hospital Stay (HOSPITAL_COMMUNITY): Payer: BLUE CROSS/BLUE SHIELD | Admitting: Anesthesiology

## 2015-06-24 ENCOUNTER — Encounter (HOSPITAL_COMMUNITY)
Admission: RE | Disposition: A | Discharge status: Home Health | Payer: Self-pay | Source: Ambulatory Visit | Attending: Orthopedic Surgery

## 2015-06-24 ENCOUNTER — Encounter (HOSPITAL_COMMUNITY): Payer: Self-pay | Admitting: Anesthesiology

## 2015-06-24 DIAGNOSIS — I119 Hypertensive heart disease without heart failure: Secondary | ICD-10-CM | POA: Diagnosis present

## 2015-06-24 DIAGNOSIS — M216X9 Other acquired deformities of unspecified foot: Secondary | ICD-10-CM | POA: Diagnosis present

## 2015-06-24 DIAGNOSIS — Z823 Family history of stroke: Secondary | ICD-10-CM | POA: Diagnosis not present

## 2015-06-24 DIAGNOSIS — Z791 Long term (current) use of non-steroidal anti-inflammatories (NSAID): Secondary | ICD-10-CM

## 2015-06-24 DIAGNOSIS — M25761 Osteophyte, right knee: Secondary | ICD-10-CM | POA: Diagnosis present

## 2015-06-24 DIAGNOSIS — Z833 Family history of diabetes mellitus: Secondary | ICD-10-CM

## 2015-06-24 DIAGNOSIS — M1711 Unilateral primary osteoarthritis, right knee: Principal | ICD-10-CM | POA: Diagnosis present

## 2015-06-24 DIAGNOSIS — Z86010 Personal history of colonic polyps: Secondary | ICD-10-CM | POA: Diagnosis not present

## 2015-06-24 DIAGNOSIS — Z01812 Encounter for preprocedural laboratory examination: Secondary | ICD-10-CM

## 2015-06-24 DIAGNOSIS — Z79899 Other long term (current) drug therapy: Secondary | ICD-10-CM | POA: Diagnosis not present

## 2015-06-24 DIAGNOSIS — Z825 Family history of asthma and other chronic lower respiratory diseases: Secondary | ICD-10-CM

## 2015-06-24 DIAGNOSIS — J45909 Unspecified asthma, uncomplicated: Secondary | ICD-10-CM | POA: Diagnosis present

## 2015-06-24 DIAGNOSIS — Z7982 Long term (current) use of aspirin: Secondary | ICD-10-CM

## 2015-06-24 DIAGNOSIS — M1710 Unilateral primary osteoarthritis, unspecified knee: Secondary | ICD-10-CM | POA: Diagnosis present

## 2015-06-24 DIAGNOSIS — M179 Osteoarthritis of knee, unspecified: Secondary | ICD-10-CM | POA: Diagnosis present

## 2015-06-24 DIAGNOSIS — M654 Radial styloid tenosynovitis [de Quervain]: Secondary | ICD-10-CM | POA: Diagnosis present

## 2015-06-24 DIAGNOSIS — K219 Gastro-esophageal reflux disease without esophagitis: Secondary | ICD-10-CM | POA: Diagnosis present

## 2015-06-24 DIAGNOSIS — M25561 Pain in right knee: Secondary | ICD-10-CM | POA: Diagnosis present

## 2015-06-24 HISTORY — PX: TOTAL KNEE ARTHROPLASTY: SHX125

## 2015-06-24 LAB — TYPE AND SCREEN
ABO/RH(D): A POS
Antibody Screen: NEGATIVE

## 2015-06-24 SURGERY — ARTHROPLASTY, KNEE, TOTAL
Anesthesia: Spinal | Site: Knee | Laterality: Right

## 2015-06-24 MED ORDER — SCOPOLAMINE 1 MG/3DAYS TD PT72
MEDICATED_PATCH | TRANSDERMAL | Status: AC
  Filled 2015-06-24: qty 1

## 2015-06-24 MED ORDER — DEXAMETHASONE SODIUM PHOSPHATE 10 MG/ML IJ SOLN
10.0000 mg | Freq: Once | INTRAMUSCULAR | Status: AC
  Administered 2015-06-24: 10 mg via INTRAVENOUS

## 2015-06-24 MED ORDER — BISACODYL 10 MG RE SUPP: 10.0000 mg | Freq: Every day | RECTAL | Status: DC | PRN

## 2015-06-24 MED ORDER — BUPIVACAINE LIPOSOME 1.3 % IJ SUSP
INTRAMUSCULAR | Status: DC | PRN
  Administered 2015-06-24: 20 mL

## 2015-06-24 MED ORDER — ONDANSETRON HCL 4 MG/2ML IJ SOLN: 4.0000 mg | Freq: Four times a day (QID) | INTRAMUSCULAR | Status: DC | PRN

## 2015-06-24 MED ORDER — CEFAZOLIN SODIUM-DEXTROSE 2-3 GM-% IV SOLR
2.0000 g | Freq: Four times a day (QID) | INTRAVENOUS | Status: AC
  Administered 2015-06-24 (×2): 2 g via INTRAVENOUS
  Filled 2015-06-24 (×2): qty 50

## 2015-06-24 MED ORDER — ACETAMINOPHEN 500 MG PO TABS
1000.0000 mg | ORAL_TABLET | Freq: Four times a day (QID) | ORAL | Status: AC
  Administered 2015-06-24 – 2015-06-25 (×4): 1000 mg via ORAL
  Filled 2015-06-24 (×5): qty 2

## 2015-06-24 MED ORDER — METOCLOPRAMIDE HCL 5 MG/ML IJ SOLN: 5.0000 mg | Freq: Three times a day (TID) | INTRAMUSCULAR | Status: DC | PRN

## 2015-06-24 MED ORDER — INFLUENZA VAC SPLIT QUAD 0.5 ML IM SUSY
0.5000 mL | PREFILLED_SYRINGE | INTRAMUSCULAR | Status: DC
  Filled 2015-06-24 (×2): qty 0.5

## 2015-06-24 MED ORDER — BUPIVACAINE HCL 0.25 % IJ SOLN
INTRAMUSCULAR | Status: DC | PRN
  Administered 2015-06-24: 20 mL

## 2015-06-24 MED ORDER — SODIUM CHLORIDE 0.9 % IV SOLN: INTRAVENOUS | Status: DC

## 2015-06-24 MED ORDER — DEXAMETHASONE SODIUM PHOSPHATE 10 MG/ML IJ SOLN
INTRAMUSCULAR | Status: AC
  Filled 2015-06-24: qty 1

## 2015-06-24 MED ORDER — DOCUSATE SODIUM 100 MG PO CAPS
100.0000 mg | ORAL_CAPSULE | Freq: Two times a day (BID) | ORAL | Status: DC
  Administered 2015-06-24 – 2015-06-26 (×4): 100 mg via ORAL

## 2015-06-24 MED ORDER — FLEET ENEMA 7-19 GM/118ML RE ENEM: 1.0000 | ENEMA | Freq: Once | RECTAL | Status: DC | PRN

## 2015-06-24 MED ORDER — SCOPOLAMINE 1 MG/3DAYS TD PT72
MEDICATED_PATCH | TRANSDERMAL | Status: DC | PRN
  Administered 2015-06-24: 1 via TRANSDERMAL

## 2015-06-24 MED ORDER — 0.9 % SODIUM CHLORIDE (POUR BTL) OPTIME
TOPICAL | Status: DC | PRN
  Administered 2015-06-24: 1000 mL

## 2015-06-24 MED ORDER — PROPOFOL 500 MG/50ML IV EMUL
INTRAVENOUS | Status: DC | PRN
  Administered 2015-06-24: 140 ug/kg/min via INTRAVENOUS

## 2015-06-24 MED ORDER — METOCLOPRAMIDE HCL 10 MG PO TABS: 5.0000 mg | ORAL_TABLET | Freq: Three times a day (TID) | ORAL | Status: DC | PRN

## 2015-06-24 MED ORDER — SODIUM CHLORIDE 0.9 % IJ SOLN
INTRAMUSCULAR | Status: DC | PRN
  Administered 2015-06-24: 30 mL

## 2015-06-24 MED ORDER — LACTATED RINGERS IV SOLN
INTRAVENOUS | Status: DC | PRN
  Administered 2015-06-24 (×2): via INTRAVENOUS

## 2015-06-24 MED ORDER — TRANEXAMIC ACID 1000 MG/10ML IV SOLN
1000.0000 mg | INTRAVENOUS | Status: AC
  Administered 2015-06-24: 1000 mg via INTRAVENOUS
  Filled 2015-06-24: qty 10

## 2015-06-24 MED ORDER — EPHEDRINE SULFATE 50 MG/ML IJ SOLN
INTRAMUSCULAR | Status: DC | PRN
  Administered 2015-06-24: 10 mg via INTRAVENOUS
  Administered 2015-06-24 (×2): 5 mg via INTRAVENOUS

## 2015-06-24 MED ORDER — STERILE WATER FOR IRRIGATION IR SOLN
Status: DC | PRN
  Administered 2015-06-24: 1000 mL

## 2015-06-24 MED ORDER — PROMETHAZINE HCL 25 MG/ML IJ SOLN: 6.2500 mg | INTRAMUSCULAR | Status: DC | PRN

## 2015-06-24 MED ORDER — MIDAZOLAM HCL 2 MG/2ML IJ SOLN
INTRAMUSCULAR | Status: AC
  Filled 2015-06-24: qty 4

## 2015-06-24 MED ORDER — MIDAZOLAM HCL 5 MG/5ML IJ SOLN
INTRAMUSCULAR | Status: DC | PRN
  Administered 2015-06-24: 2 mg via INTRAVENOUS

## 2015-06-24 MED ORDER — PHENOL 1.4 % MT LIQD: 1.0000 | OROMUCOSAL | Status: DC | PRN

## 2015-06-24 MED ORDER — CEFAZOLIN SODIUM-DEXTROSE 2-3 GM-% IV SOLR
INTRAVENOUS | Status: AC
  Filled 2015-06-24: qty 50

## 2015-06-24 MED ORDER — LOSARTAN POTASSIUM 25 MG PO TABS
25.0000 mg | ORAL_TABLET | Freq: Every day | ORAL | Status: DC
  Administered 2015-06-25 – 2015-06-26 (×2): 25 mg via ORAL
  Filled 2015-06-24 (×2): qty 1

## 2015-06-24 MED ORDER — OXYCODONE HCL 5 MG PO TABS
5.0000 mg | ORAL_TABLET | ORAL | Status: DC | PRN
  Administered 2015-06-24 – 2015-06-26 (×13): 10 mg via ORAL
  Filled 2015-06-24 (×6): qty 2
  Filled 2015-06-24: qty 1
  Filled 2015-06-24 (×6): qty 2
  Filled 2015-06-24: qty 1

## 2015-06-24 MED ORDER — BUPIVACAINE HCL (PF) 0.25 % IJ SOLN
INTRAMUSCULAR | Status: AC
  Filled 2015-06-24: qty 30

## 2015-06-24 MED ORDER — DEXTROSE-NACL 5-0.9 % IV SOLN
INTRAVENOUS | Status: DC
  Administered 2015-06-24 – 2015-06-25 (×2): via INTRAVENOUS

## 2015-06-24 MED ORDER — PROPOFOL 10 MG/ML IV BOLUS
INTRAVENOUS | Status: AC
  Filled 2015-06-24: qty 60

## 2015-06-24 MED ORDER — PROPOFOL 10 MG/ML IV BOLUS
INTRAVENOUS | Status: AC
  Filled 2015-06-24: qty 20

## 2015-06-24 MED ORDER — SODIUM CHLORIDE 0.9 % IR SOLN
Status: DC | PRN
  Administered 2015-06-24: 1000 mL

## 2015-06-24 MED ORDER — FENTANYL CITRATE (PF) 100 MCG/2ML IJ SOLN
INTRAMUSCULAR | Status: AC
  Filled 2015-06-24: qty 4

## 2015-06-24 MED ORDER — ACETAMINOPHEN 650 MG RE SUPP: 650.0000 mg | Freq: Four times a day (QID) | RECTAL | Status: DC | PRN

## 2015-06-24 MED ORDER — MOMETASONE FURO-FORMOTEROL FUM 100-5 MCG/ACT IN AERO: 2.0000 | INHALATION_SPRAY | Freq: Every day | RESPIRATORY_TRACT | Status: DC | PRN

## 2015-06-24 MED ORDER — PANTOPRAZOLE SODIUM 40 MG PO TBEC: 40.0000 mg | DELAYED_RELEASE_TABLET | Freq: Every day | ORAL | Status: DC | PRN

## 2015-06-24 MED ORDER — FENTANYL CITRATE (PF) 100 MCG/2ML IJ SOLN
INTRAMUSCULAR | Status: DC | PRN
  Administered 2015-06-24 (×2): 50 ug via INTRAVENOUS

## 2015-06-24 MED ORDER — CHLORHEXIDINE GLUCONATE 4 % EX LIQD: 60.0000 mL | Freq: Once | CUTANEOUS | Status: DC

## 2015-06-24 MED ORDER — MORPHINE SULFATE (PF) 2 MG/ML IV SOLN
1.0000 mg | INTRAVENOUS | Status: DC | PRN
  Administered 2015-06-24 – 2015-06-25 (×5): 1 mg via INTRAVENOUS
  Filled 2015-06-24 (×5): qty 1

## 2015-06-24 MED ORDER — RIVAROXABAN 10 MG PO TABS
10.0000 mg | ORAL_TABLET | Freq: Every day | ORAL | Status: DC
  Administered 2015-06-25 – 2015-06-26 (×2): 10 mg via ORAL
  Filled 2015-06-24 (×3): qty 1

## 2015-06-24 MED ORDER — BUPIVACAINE LIPOSOME 1.3 % IJ SUSP
20.0000 mL | Freq: Once | INTRAMUSCULAR | Status: DC
  Filled 2015-06-24: qty 20

## 2015-06-24 MED ORDER — CEFAZOLIN SODIUM-DEXTROSE 2-3 GM-% IV SOLR
2.0000 g | INTRAVENOUS | Status: AC
  Administered 2015-06-24: 2 g via INTRAVENOUS

## 2015-06-24 MED ORDER — LACTATED RINGERS IV SOLN: INTRAVENOUS | Status: DC

## 2015-06-24 MED ORDER — DIPHENHYDRAMINE HCL 12.5 MG/5ML PO ELIX: 12.5000 mg | ORAL_SOLUTION | ORAL | Status: DC | PRN

## 2015-06-24 MED ORDER — DEXAMETHASONE SODIUM PHOSPHATE 10 MG/ML IJ SOLN
10.0000 mg | Freq: Once | INTRAMUSCULAR | Status: AC
  Administered 2015-06-25: 10 mg via INTRAVENOUS
  Filled 2015-06-24: qty 1

## 2015-06-24 MED ORDER — HYDROMORPHONE HCL 1 MG/ML IJ SOLN: 0.2500 mg | INTRAMUSCULAR | Status: DC | PRN

## 2015-06-24 MED ORDER — KETOROLAC TROMETHAMINE 15 MG/ML IJ SOLN: 7.5000 mg | Freq: Four times a day (QID) | INTRAMUSCULAR | Status: AC | PRN

## 2015-06-24 MED ORDER — ACETAMINOPHEN 325 MG PO TABS
650.0000 mg | ORAL_TABLET | Freq: Four times a day (QID) | ORAL | Status: DC | PRN
  Administered 2015-06-25: 650 mg via ORAL
  Filled 2015-06-24: qty 2

## 2015-06-24 MED ORDER — ONDANSETRON HCL 4 MG/2ML IJ SOLN
INTRAMUSCULAR | Status: DC | PRN
Start: 2015-06-24 — End: 2015-06-24
  Administered 2015-06-24: 4 mg via INTRAVENOUS

## 2015-06-24 MED ORDER — METHOCARBAMOL 1000 MG/10ML IJ SOLN
500.0000 mg | Freq: Four times a day (QID) | INTRAVENOUS | Status: DC | PRN
  Filled 2015-06-24: qty 5

## 2015-06-24 MED ORDER — ACETAMINOPHEN 10 MG/ML IV SOLN
1000.0000 mg | Freq: Once | INTRAVENOUS | Status: AC
  Administered 2015-06-24: 1000 mg via INTRAVENOUS
  Filled 2015-06-24: qty 100

## 2015-06-24 MED ORDER — ACETAMINOPHEN 10 MG/ML IV SOLN
INTRAVENOUS | Status: AC
  Filled 2015-06-24: qty 100

## 2015-06-24 MED ORDER — TRAMADOL HCL 50 MG PO TABS: 50.0000 mg | ORAL_TABLET | Freq: Four times a day (QID) | ORAL | Status: DC | PRN

## 2015-06-24 MED ORDER — MENTHOL 3 MG MT LOZG: 1.0000 | LOZENGE | OROMUCOSAL | Status: DC | PRN

## 2015-06-24 MED ORDER — BUPIVACAINE IN DEXTROSE 0.75-8.25 % IT SOLN
INTRATHECAL | Status: DC | PRN
  Administered 2015-06-24: 1.6 mg via INTRATHECAL

## 2015-06-24 MED ORDER — SODIUM CHLORIDE 0.9 % IJ SOLN
INTRAMUSCULAR | Status: AC
  Filled 2015-06-24: qty 50

## 2015-06-24 MED ORDER — POLYETHYLENE GLYCOL 3350 17 G PO PACK: 17.0000 g | PACK | Freq: Every day | ORAL | Status: DC | PRN

## 2015-06-24 MED ORDER — ONDANSETRON HCL 4 MG PO TABS
4.0000 mg | ORAL_TABLET | Freq: Four times a day (QID) | ORAL | Status: DC | PRN
  Administered 2015-06-24: 4 mg via ORAL
  Filled 2015-06-24: qty 1

## 2015-06-24 MED ORDER — METHOCARBAMOL 500 MG PO TABS
500.0000 mg | ORAL_TABLET | Freq: Four times a day (QID) | ORAL | Status: DC | PRN
  Administered 2015-06-24 – 2015-06-26 (×7): 500 mg via ORAL
  Filled 2015-06-24 (×7): qty 1

## 2015-06-24 MED ORDER — ONDANSETRON HCL 4 MG/2ML IJ SOLN
INTRAMUSCULAR | Status: AC
Start: 2015-06-24 — End: 2015-06-24
  Filled 2015-06-24: qty 2

## 2015-06-24 SURGICAL SUPPLY — 65 items
BAG DECANTER FOR FLEXI CONT (MISCELLANEOUS) ×1 IMPLANT
BAG SPEC THK2 15X12 ZIP CLS (MISCELLANEOUS) ×1
BAG ZIPLOCK 12X15 (MISCELLANEOUS) ×3 IMPLANT
BANDAGE ELASTIC 6 VELCRO ST LF (GAUZE/BANDAGES/DRESSINGS) ×3 IMPLANT
BANDAGE ESMARK 6X9 LF (GAUZE/BANDAGES/DRESSINGS) ×1 IMPLANT
BLADE SAG 18X100X1.27 (BLADE) ×3 IMPLANT
BLADE SAW SGTL 11.0X1.19X90.0M (BLADE) ×3 IMPLANT
BNDG CMPR 9X6 STRL LF SNTH (GAUZE/BANDAGES/DRESSINGS) ×1
BNDG ESMARK 6X9 LF (GAUZE/BANDAGES/DRESSINGS) ×3
BOWL SMART MIX CTS (DISPOSABLE) ×3 IMPLANT
CAPT KNEE TOTAL 3 ATTUNE ×2 IMPLANT
CEMENT HV SMART SET (Cement) ×6 IMPLANT
CLOSURE WOUND 1/2 X4 (GAUZE/BANDAGES/DRESSINGS) ×1
CUFF TOURN SGL QUICK 34 (TOURNIQUET CUFF) ×3
CUFF TRNQT CYL 34X4X40X1 (TOURNIQUET CUFF) ×1 IMPLANT
DECANTER SPIKE VIAL GLASS SM (MISCELLANEOUS) ×3 IMPLANT
DRAPE EXTREMITY T 121X128X90 (DRAPE) ×3 IMPLANT
DRAPE POUCH INSTRU U-SHP 10X18 (DRAPES) ×3 IMPLANT
DRAPE U-SHAPE 47X51 STRL (DRAPES) ×3 IMPLANT
DRSG ADAPTIC 3X8 NADH LF (GAUZE/BANDAGES/DRESSINGS) ×3 IMPLANT
DRSG PAD ABDOMINAL 8X10 ST (GAUZE/BANDAGES/DRESSINGS) ×3 IMPLANT
DURAPREP 26ML APPLICATOR (WOUND CARE) ×3 IMPLANT
ELECT REM PT RETURN 9FT ADLT (ELECTROSURGICAL) ×3
ELECTRODE REM PT RTRN 9FT ADLT (ELECTROSURGICAL) ×1 IMPLANT
EVACUATOR 1/8 PVC DRAIN (DRAIN) ×3 IMPLANT
FACESHIELD WRAPAROUND (MASK) ×15 IMPLANT
FACESHIELD WRAPAROUND OR TEAM (MASK) ×5 IMPLANT
GAUZE SPONGE 4X4 12PLY STRL (GAUZE/BANDAGES/DRESSINGS) ×3 IMPLANT
GLOVE BIO SURGEON STRL SZ7.5 (GLOVE) ×2 IMPLANT
GLOVE BIO SURGEON STRL SZ8 (GLOVE) ×3 IMPLANT
GLOVE BIOGEL PI IND STRL 6.5 (GLOVE) IMPLANT
GLOVE BIOGEL PI IND STRL 8 (GLOVE) ×1 IMPLANT
GLOVE BIOGEL PI INDICATOR 6.5 (GLOVE)
GLOVE BIOGEL PI INDICATOR 8 (GLOVE) ×2
GLOVE SURG SS PI 6.5 STRL IVOR (GLOVE) IMPLANT
GOWN STRL REUS W/TWL LRG LVL3 (GOWN DISPOSABLE) ×3 IMPLANT
GOWN STRL REUS W/TWL XL LVL3 (GOWN DISPOSABLE) IMPLANT
HANDPIECE INTERPULSE COAX TIP (DISPOSABLE) ×3
IMMOBILIZER KNEE 20 (SOFTGOODS) ×3
IMMOBILIZER KNEE 20 THIGH 36 (SOFTGOODS) ×1 IMPLANT
KIT BASIN OR (CUSTOM PROCEDURE TRAY) ×3 IMPLANT
MANIFOLD NEPTUNE II (INSTRUMENTS) ×3 IMPLANT
NDL SAFETY ECLIPSE 18X1.5 (NEEDLE) ×2 IMPLANT
NEEDLE HYPO 18GX1.5 SHARP (NEEDLE) ×6
NS IRRIG 1000ML POUR BTL (IV SOLUTION) ×3 IMPLANT
PACK TOTAL JOINT (CUSTOM PROCEDURE TRAY) ×3 IMPLANT
PADDING CAST COTTON 6X4 STRL (CAST SUPPLIES) ×5 IMPLANT
PEN SKIN MARKING BROAD (MISCELLANEOUS) ×3 IMPLANT
POSITIONER SURGICAL ARM (MISCELLANEOUS) ×3 IMPLANT
SET HNDPC FAN SPRY TIP SCT (DISPOSABLE) ×1 IMPLANT
STRIP CLOSURE SKIN 1/2X4 (GAUZE/BANDAGES/DRESSINGS) ×3 IMPLANT
SUCTION FRAZIER 12FR DISP (SUCTIONS) ×3 IMPLANT
SUT MNCRL AB 4-0 PS2 18 (SUTURE) ×3 IMPLANT
SUT VIC AB 2-0 CT1 27 (SUTURE) ×9
SUT VIC AB 2-0 CT1 TAPERPNT 27 (SUTURE) ×3 IMPLANT
SUT VLOC 180 0 24IN GS25 (SUTURE) ×3 IMPLANT
SYR 20CC LL (SYRINGE) ×3 IMPLANT
SYR 50ML LL SCALE MARK (SYRINGE) ×3 IMPLANT
TOWEL OR 17X26 10 PK STRL BLUE (TOWEL DISPOSABLE) ×3 IMPLANT
TOWEL OR NON WOVEN STRL DISP B (DISPOSABLE) IMPLANT
TRAY FOLEY W/METER SILVER 14FR (SET/KITS/TRAYS/PACK) ×3 IMPLANT
TRAY FOLEY W/METER SILVER 16FR (SET/KITS/TRAYS/PACK) ×1 IMPLANT
WATER STERILE IRR 1500ML POUR (IV SOLUTION) ×3 IMPLANT
WRAP KNEE MAXI GEL POST OP (GAUZE/BANDAGES/DRESSINGS) ×3 IMPLANT
YANKAUER SUCT BULB TIP 10FT TU (MISCELLANEOUS) ×3 IMPLANT

## 2015-06-24 NOTE — Transfer of Care (Signed)
Immediate Anesthesia Transfer of Care Note  Patient: Laura [name]  Procedure(s) Performed: Procedure(s): RIGHT TOTAL KNEE ARTHROPLASTY (Right)  Patient Location: PACU  Anesthesia Type:Spinal  Level of Consciousness:  sedated, patient cooperative and responds to stimulation  Airway & Oxygen Therapy:Patient Spontanous Breathing and Patient connected to face mask oxgen  Post-op Assessment:  Report given to PACU RN and Post -op Vital signs reviewed and stable  Post vital signs:  Reviewed and stable  Last Vitals:  Filed Vitals:   06/24/15 0743  BP: 151/88  Pulse: 84  Temp: 36.6 C  Resp: 16    Complications: No apparent anesthesia complications

## 2015-06-24 NOTE — Anesthesia Postprocedure Evaluation (Signed)
  Anesthesia Post-op Note  Patient: Laura [name]  Procedure(s) Performed: Procedure(s) (LRB): RIGHT TOTAL KNEE ARTHROPLASTY (Right)  Patient Location: PACU  Anesthesia Type: Spinal  Level of Consciousness: awake and alert   Airway and Oxygen Therapy: Patient Spontanous Breathing  Post-op Pain: mild  Post-op Assessment: Post-op Vital signs reviewed, Patient's Cardiovascular Status Stable, Respiratory Function Stable, Patent Airway and No signs of Nausea or vomiting. Moves both legs to commands in PACU.  Last Vitals:  Filed Vitals:   06/24/15 1600  BP: 126/53  Pulse: 75  Temp: 36.8 C  Resp: 16    Post-op Vital Signs: stable   Complications: No apparent anesthesia complications

## 2015-06-24 NOTE — H&P (View-Only) (Signed)
Laura [name] DOB: 10-Jun-1956 Married / Language: English / Race: White Female Date of Admission:  06/17/2015 CC:  Right Knee Pain History of Present Illness The patient is a 59 year old female who comes in for a preoperative History and Physical. The patient is scheduled for a right total knee arthroplasty to be performed by Dr. Dione Plover. Aluisio, MD at [facility] on 06-17-2015. Shw had been seen and treated by Dr. Wynelle Link for over 15 years with regard to her knee. She sustained a fall many years ago that eventually required her to have the ACL reconstructed in her right knee and had been having problems for quite some time. She was going to have her knee replaced a while by but unfortunately fell and sustained a lef elbox frature and injury which required several surgeries. She has since recovered form the elbow and now is ready to proceed with the right total knee replacement. They have been treated conservatively in the past for the above stated problem and despite conservative measures, they continue to have progressive pain and severe functional limitations and dysfunction. They have failed non-operative management including home exercise, medications, and injections. It is felt that they would benefit from undergoing total joint replacement. Risks and benefits of the procedure have been discussed with the patient and they elect to proceed with surgery. There are no active contraindications to surgery such as ongoing infection or rapidly progressive neurological disease.  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 Osteoarthrosis NOS, lower leg (715.96)06/20/2004  Allergies No Known Drug  Allergies  Family History  Chronic Obstructive Lung Disease father and brother Cerebrovascular Accident grandmother fathers side and grandfather fathers side Diabetes Mellitus mother  Social History Illicit drug use no Exercise Exercises rarely; does other, Exercises rarely; does running / walking Marital status married Living situation live with spouse Current work status working full time Children 1 Drug/Alcohol Rehab (Previously) no Drug/Alcohol Rehab (Currently) no Pain Contract no Tobacco / smoke exposure no Alcohol use never consumed alcohol Tobacco use never smoker  Medication History  Aspirin (81MG  Tablet, 1 (one) Oral) Active. Calcium/Vitamin D/Minerals (600-200MG -UNIT Tablet, 1 (one) Oral) Active. Fish Oil Concentrate (435MG  Capsule, 1 (one) Oral) Active. Losartan Potassium (25MG  Tablet, Oral) Active. Multiple Vitamin (1 (one) Oral) Active. Naprosyn (250MG  Tablet, Oral as needed) Active. Vitamin E (200UNIT Capsule, 1 (one) Oral) Active.  Past Surgical History  Colon Polyp Removal - Colonoscopy Arthroscopy of Knee bilateral Hysterectomy Date: 2001. partial (non-cancerous) partial (cancerous) Gallbladder Surgery open Straighten Nasal Septum Sinus Surgery Oophorectomy; Unilateral Left ACL Reconstruction - Right Date: 2000. Left Elbow Surgery 2013, 2014 Left Breast Lumpectomy Date: 2015. Benign   Review of Systems General Not Present- Chills, Fatigue, Fever, Memory Loss, Night Sweats, Weight Gain and Weight Loss. Skin Not Present- Eczema, Hives, Itching, Lesions and Rash. HEENT Not Present- Dentures, Double Vision, Headache, Hearing Loss, Tinnitus and Visual Loss. Respiratory Not Present- Allergies, Chronic Cough, Coughing up blood, Shortness of breath at rest and Shortness of breath with exertion. Cardiovascular Not Present- Chest Pain, Difficulty Breathing Lying Down, Murmur, Palpitations, Racing/skipping heartbeats and  Swelling. Gastrointestinal Not Present- Abdominal Pain, Bloody Stool, Constipation, Diarrhea, Difficulty Swallowing, Heartburn, Jaundice, Loss of appetitie, Nausea and Vomiting. Female Genitourinary Not Present- Blood in Urine, Discharge, Flank Pain, Incontinence, Painful Urination, Urgency, Urinary frequency, Urinary Retention,  Urinating at Night and Weak urinary stream. Musculoskeletal Present- Joint Pain. Not Present- Back Pain, Joint Swelling, Morning Stiffness, Muscle Pain, Muscle Weakness and Spasms. Neurological Not Present- Blackout spells, Difficulty with balance, Dizziness, Paralysis, Tremor and Weakness. Psychiatric Not Present- Insomnia.  Vitals  Weight: 178 lb Height: 67in Body Surface Area: 1.92 m Body Mass Index: 27.88 kg/m  BP: 142/80 (Sitting, Left Arm, Standard)   Physical Exam General Mental Status -Alert, cooperative and good historian. General Appearance-pleasant, Not in acute distress. Orientation-Oriented X3. Build & Nutrition-Well nourished and Well developed.  Head and Neck Head-normocephalic, atraumatic . Neck Global Assessment - supple, no bruit auscultated on the right, no bruit auscultated on the left.  Eye Pupil - Bilateral-Regular and Round. Motion - Bilateral-EOMI.  ENMT Note: lower partial denture plate   Chest and Lung Exam Auscultation Breath sounds - clear at anterior chest wall and clear at posterior chest wall. Adventitious sounds - No Adventitious sounds.  Cardiovascular Auscultation Rhythm - Regular rate and rhythm. Heart Sounds - S1 WNL and S2 WNL. Murmurs & Other Heart Sounds - Auscultation of the heart reveals - No Murmurs.  Abdomen Palpation/Percussion Tenderness - Abdomen is non-tender to palpation. Rigidity (guarding) - Abdomen is soft. Auscultation Auscultation of the abdomen reveals - Bowel sounds normal.  Female Genitourinary Note: Not done, not pertinent to present  illness   Musculoskeletal Note: Previous anterior ACL reconstruction incision well healed. ROM 7-115. Crepitus noted. No Effusion.  Assessment & Plan Primary osteoarthritis of right knee (M17.11) Note:Surgical Plans: Left Right Total Knee Hip Replacement - Anterior Approach  Disposition: Home  PCP: Dr. Renato Shin  IV TXA  Anesthesia Issues: Postop Nausea in the past  Signed electronically by Alexzandrew Monika Salk, III PA-C

## 2015-06-24 NOTE — Progress Notes (Signed)
Utilization review completed.  

## 2015-06-24 NOTE — Evaluation (Signed)
Physical Therapy Evaluation Patient Details Name: Laura [name] MRN: [id number] DOB: [birth year] Today's Date: 06/24/2015   History of Present Illness  RTKA  Clinical Impression  Patient  Ambulated x 12 felt, felt a little dizzy. Patient will benefit from PT to address problems listed in the note below.    Follow Up Recommendations Home health PT;Supervision/Assistance - 24 hour    Equipment Recommendations  Rolling walker with 5" wheels    Recommendations for Other Services       Precautions / Restrictions Precautions Precautions: Knee Required Braces or Orthoses: Knee Immobilizer - Right Knee Immobilizer - Right: Discontinue once straight leg raise with < 10 degree lag      Mobility  Bed Mobility Overal bed mobility: Needs Assistance Bed Mobility: Supine to Sit;Sit to Supine     Supine to sit: Min assist Sit to supine: Min assist   General bed mobility comments: assist with the r ight leg off and onto the bed.  Transfers Overall transfer level: Needs assistance Equipment used: Rolling walker (2 wheeled) Transfers: Sit to/from Stand Sit to Stand: Min assist         General transfer comment: cues for hand placement and R leg  Ambulation/Gait Ambulation/Gait assistance: Min assist Ambulation Distance (Feet): 12 Feet Assistive device: Rolling walker (2 wheeled) Gait Pattern/deviations: Step-to pattern;Decreased step length - right;Decreased stance time - right     General Gait Details: cues for sequence  Stairs            Wheelchair Mobility    Modified Rankin (Stroke Patients Only)       Balance                                             Pertinent Vitals/Pain Pain Assessment: 0-10 Pain Score: 4  Pain Descriptors / Indicators: Aching;Grimacing Pain Intervention(s): Limited activity within patient's tolerance;Premedicated before session;Ice applied    Home Living Family/patient expects to be discharged to:: Private  residence Living Arrangements: Spouse/significant other;Children Available Help at Discharge: Family Type of Home: House Home Access: Stairs to enter Entrance Stairs-Rails: None Technical brewer of Steps: 2 Home Layout: One level Home Equipment: None      Prior Function Level of Independence: Independent               Hand Dominance        Extremity/Trunk Assessment               Lower Extremity Assessment: RLE deficits/detail RLE Deficits / Details: assist to raise the leg    Cervical / Trunk Assessment: Normal  Communication   Communication: No difficulties  Cognition Arousal/Alertness: Awake/alert Behavior During Therapy: WFL for tasks assessed/performed Overall Cognitive Status: Within Functional Limits for tasks assessed                      General Comments      Exercises        Assessment/Plan    PT Assessment Patient needs continued PT services  PT Diagnosis Difficulty walking;Acute pain   PT Problem List Decreased strength;Decreased range of motion;Decreased activity tolerance;Decreased mobility;Decreased knowledge of precautions;Decreased safety awareness;Decreased knowledge of use of DME;Pain  PT Treatment Interventions DME instruction;Gait training;Functional mobility training;Therapeutic activities;Stair training;Therapeutic exercise;Patient/family education   PT Goals (Current goals can be found in the Care Plan section) Acute Rehab PT Goals Patient Stated Goal:  to go home PT Goal Formulation: With patient Time For Goal Achievement: 06/28/15 Potential to Achieve Goals: Good    Frequency 7X/week   Barriers to discharge        Co-evaluation               End of Session Equipment Utilized During Treatment: Right knee immobilizer Activity Tolerance: Patient tolerated treatment well Patient left: in bed;with call bell/phone within reach Nurse Communication: Mobility status         Time: 1725-1746 PT Time  Calculation (min) (ACUTE ONLY): 21 min   Charges:   PT Evaluation $Initial PT Evaluation Tier I: 1 Procedure     PT G CodesClaretha Cooper 06/24/2015, 6:52 PM

## 2015-06-24 NOTE — Op Note (Signed)
Pre-operative diagnosis- Osteoarthritis  Right knee(s)  Post-operative diagnosis- Osteoarthritis Right knee(s)  Procedure-  Right  Total Knee Arthroplasty  Surgeon- Laura Plover. Eboni Coval, MD  Assistant- Arlee Muslim, PA-C   Anesthesia-  Spinal  EBL-* No blood loss amount entered *   Drains Hemovac  Tourniquet time-  Total Tourniquet Time Documented: Thigh (Right) - 29 minutes Total: Thigh (Right) - 29 minutes     Complications- None  Condition-PACU - hemodynamically stable.   Brief Clinical Note  Laura [name] is a 59 y.o. year old female with end stage OA of her right knee with progressively worsening pain and dysfunction. She has constant pain, with activity and at rest and significant functional deficits with difficulties even with ADLs. She has had extensive non-op management including analgesics, injections of cortisone, and home exercise program, but remains in significant pain with significant dysfunction.Radiographs show bone on bone arthritis medial. She presents now for right Total Knee Arthroplasty.    Procedure in detail---   The patient is brought into the operating room and positioned supine on the operating table. After successful administration of  Spinal,   a tourniquet is placed high on the  Right thigh(s) and the lower extremity is prepped and draped in the usual sterile fashion. Time out is performed by the operating team and then the  Right lower extremity is wrapped in Esmarch, knee flexed and the tourniquet inflated to 300 mmHg.       A midline incision is made with a ten blade through the subcutaneous tissue to the level of the extensor mechanism. A fresh blade is used to make a medial parapatellar arthrotomy. Soft tissue over the proximal medial tibia is subperiosteally elevated to the joint line with a knife and into the semimembranosus bursa with a Cobb elevator. Soft tissue over the proximal lateral tibia is elevated with attention being paid to avoiding the  patellar tendon on the tibial tubercle. The patella is everted, knee flexed 90 degrees and the ACL and PCL are removed. Findings are exposed bone all 3 compartments with large global osteophytes.        The drill is used to create a starting hole in the distal femur and the canal is thoroughly irrigated with sterile saline to remove the fatty contents. The 5 degree Right  valgus alignment guide is placed into the femoral canal and the distal femoral cutting block is pinned to remove 9 mm off the distal femur. Resection is made with an oscillating saw.      The tibia is subluxed forward and the menisci are removed. The extramedullary alignment guide is placed referencing proximally at the medial aspect of the tibial tubercle and distally along the second metatarsal axis and tibial crest. The block is pinned to remove 54mm off the more deficient medial  side. Resection is made with an oscillating saw. Size 6is the most appropriate size for the tibia and the proximal tibia is prepared with the modular drill and keel punch for that size.      The femoral sizing guide is placed and size 6 is most appropriate. Rotation is marked off the epicondylar axis and confirmed by creating a rectangular flexion gap at 90 degrees. The size 6 cutting block is pinned in this rotation and the anterior, posterior and chamfer cuts are made with the oscillating saw. The intercondylar block is then placed and that cut is made.      Trial size 6 tibial component, trial size 6 posterior stabilized femur  and a 6  mm posterior stabilized rotating platform insert trial is placed. Full extension is achieved with excellent varus/valgus and anterior/posterior balance throughout full range of motion. The patella is everted and thickness measured to be 22  mm. Free hand resection is taken to 12 mm, a 35 template is placed, lug holes are drilled, trial patella is placed, and it tracks normally. Osteophytes are removed off the posterior femur with  the trial in place. All trials are removed and the cut bone surfaces prepared with pulsatile lavage. Cement is mixed and once ready for implantation, the size 6 tibial implant, size  6 posterior stabilized femoral component, and the size 35 patella are cemented in place and the patella is held with the clamp. The trial insert is placed and the knee held in full extension. The Exparel (20 ml mixed with 30 ml saline) and .25% Bupivicaine, are injected into the extensor mechanism, posterior capsule, medial and lateral gutters and subcutaneous tissues.  All extruded cement is removed and once the cement is hard the permanent 6 mm posterior stabilized rotating platform insert is placed into the tibial tray.      The wound is copiously irrigated with saline solution and the extensor mechanism closed over a hemovac drain with #1 V-loc suture. The tourniquet is released for a total tourniquet time of 29  minutes. Flexion against gravity is 140 degrees and the patella tracks normally. Subcutaneous tissue is closed with 2.0 vicryl and subcuticular with running 4.0 Monocryl. The incision is cleaned and dried and steri-strips and a bulky sterile dressing are applied. The limb is placed into a knee immobilizer and the patient is awakened and transported to recovery in stable condition.      Please note that a surgical assistant was a medical necessity for this procedure in order to perform it in a safe and expeditious manner. Surgical assistant was necessary to retract the ligaments and vital neurovascular structures to prevent injury to them and also necessary for proper positioning of the limb to allow for anatomic placement of the prosthesis.   Laura Plover Chimamanda Siegfried, MD    06/24/2015, 11:07 AM

## 2015-06-24 NOTE — Anesthesia Preprocedure Evaluation (Addendum)
Anesthesia Evaluation  Patient identified by MRN, date of birth, ID band Patient awake    Reviewed: Allergy & Precautions, NPO status , Patient's Chart, lab work & pertinent test results  History of Anesthesia Complications (+) PONV and history of anesthetic complications  Airway Mallampati: II  TM Distance: >3 FB Neck ROM: Full    Dental no notable dental hx.    Pulmonary asthma ,    Pulmonary exam normal breath sounds clear to auscultation       Cardiovascular Exercise Tolerance: Good hypertension, Pt. on medications Normal cardiovascular exam Rhythm:Regular Rate:Normal     Neuro/Psych  Neuromuscular disease negative psych ROS   GI/Hepatic Neg liver ROS, GERD  Medicated,  Endo/Other  negative endocrine ROS  Renal/GU negative Renal ROS  negative genitourinary   Musculoskeletal negative musculoskeletal ROS (+)   Abdominal   Peds negative pediatric ROS (+)  Hematology negative hematology ROS (+)   Anesthesia Other Findings   Reproductive/Obstetrics negative OB ROS                             Anesthesia Physical Anesthesia Plan  ASA: II  Anesthesia Plan: Spinal   Post-op Pain Management:    Induction: Intravenous  Airway Management Planned:   Additional Equipment:   Intra-op Plan:   Post-operative Plan:   Informed Consent: I have reviewed the patients History and Physical, chart, labs and discussed the procedure including the risks, benefits and alternatives for the proposed anesthesia with the patient or authorized representative who has indicated his/her understanding and acceptance.   Dental advisory given  Plan Discussed with: CRNA  Anesthesia Plan Comments: (Discussed risks and benefits of and differences between spinal and general. Discussed risks of spinal including headache, backache, failure, bleeding and hematoma, infection, and nerve damage. Patient consents to  spinal. Questions answered. Coagulation studies and platelet count acceptable. )       Anesthesia Quick Evaluation

## 2015-06-24 NOTE — Anesthesia Procedure Notes (Signed)
Spinal Patient location during procedure: OR Start time: 06/24/2015 10:11 AM End time: 06/24/2015 10:16 AM Staffing Anesthesiologist: Franne Grip Resident/CRNA: Danley Danker L Performed by: resident/CRNA  Preanesthetic Checklist Completed: patient identified, site marked, surgical consent, pre-op evaluation, timeout performed, IV checked, risks and benefits discussed and monitors and equipment checked Spinal Block Patient position: sitting Prep: Betadine Patient monitoring: heart rate, continuous pulse ox and blood pressure Approach: midline Location: L3-4 Injection technique: single-shot Needle Needle type: Sprotte  Needle gauge: 24 G Needle length: 10 cm Assessment Sensory level: T6 Additional Notes Negative heme, free flow CSF, negative paresthesia Kit # [id number] and expiration date 08/2016 Tolerated well and returned to supine position

## 2015-06-24 NOTE — Interval H&P Note (Signed)
History and Physical Interval Note:  06/24/2015 9:22 AM  Sande Brothers  has presented today for surgery, with the diagnosis of right knee osteoarthritis  The various methods of treatment have been discussed with the patient and family. After consideration of risks, benefits and other options for treatment, the patient has consented to  Procedure(s): RIGHT TOTAL KNEE ARTHROPLASTY (Right) as a surgical intervention .  The patient's history has been reviewed, patient examined, no change in status, stable for surgery.  I have reviewed the patient's chart and labs.  Questions were answered to the patient's satisfaction.     Gearlean Alf

## 2015-06-25 LAB — CBC
HCT: 31.4 % — ABNORMAL LOW (ref 36.0–46.0)
Hemoglobin: 10.4 g/dL — ABNORMAL LOW (ref 12.0–15.0)
MCH: 30.3 pg (ref 26.0–34.0)
MCHC: 33.1 g/dL (ref 30.0–36.0)
MCV: 91.5 fL (ref 78.0–100.0)
PLATELETS: 239 10*3/uL (ref 150–400)
RBC: 3.43 MIL/uL — ABNORMAL LOW (ref 3.87–5.11)
RDW: 13.9 % (ref 11.5–15.5)
WBC: 11.8 10*3/uL — ABNORMAL HIGH (ref 4.0–10.5)

## 2015-06-25 LAB — BASIC METABOLIC PANEL
Anion gap: 5 (ref 5–15)
BUN: 8 mg/dL (ref 6–20)
CALCIUM: 8.7 mg/dL — AB (ref 8.9–10.3)
CO2: 25 mmol/L (ref 22–32)
Chloride: 110 mmol/L (ref 101–111)
Creatinine, Ser: 0.51 mg/dL (ref 0.44–1.00)
GFR calc Af Amer: >60 mL/min (ref >60)
GLUCOSE: 160 mg/dL — AB (ref 65–99)
POTASSIUM: 3.8 mmol/L (ref 3.5–5.1)
Sodium: 140 mmol/L (ref 135–145)

## 2015-06-25 MED ORDER — LANSOPRAZOLE 30 MG PO CPDR
30.0000 mg | DELAYED_RELEASE_CAPSULE | Freq: Every day | ORAL | Status: DC
  Administered 2015-06-25 – 2015-06-26 (×2): 30 mg via ORAL
  Filled 2015-06-25 (×2): qty 1

## 2015-06-25 MED ORDER — OXYCODONE HCL 5 MG PO TABS: 5.0000 mg | ORAL_TABLET | ORAL | Status: DC | PRN

## 2015-06-25 MED ORDER — TRAMADOL HCL 50 MG PO TABS: 50.0000 mg | ORAL_TABLET | Freq: Four times a day (QID) | ORAL | Status: DC | PRN

## 2015-06-25 MED ORDER — NON FORMULARY: 30.0000 mg | Freq: Every day | Status: DC

## 2015-06-25 MED ORDER — RIVAROXABAN 10 MG PO TABS: 10.0000 mg | ORAL_TABLET | Freq: Every day | ORAL | Status: DC

## 2015-06-25 MED ORDER — METHOCARBAMOL 500 MG PO TABS: 500.0000 mg | ORAL_TABLET | Freq: Four times a day (QID) | ORAL | Status: DC | PRN

## 2015-06-25 MED ORDER — LIP MEDEX EX OINT
TOPICAL_OINTMENT | CUTANEOUS | Status: AC
  Administered 2015-06-25: 09:00:00
  Filled 2015-06-25: qty 7

## 2015-06-25 NOTE — Discharge Summary (Signed)
Physician Discharge Summary   Patient ID: Laura [name] MRN: [id number] DOB/AGE: 01/16/1956 59 y.o.  Admit date: 06/24/2015 Discharge date: 06-26-2015  Primary Diagnosis:  Osteoarthritis Right knee(s)  Admission Diagnoses:  Past Medical History  Diagnosis Date  . ESOPHAGEAL STRICTURE 02/17/2008    dilation done - no problems since.  . ALLERGIC RHINITIS 01/25/2008  . ASTHMA 01/25/2008  . Unspecified disorder of liver 01/19/2008    evaluated and thought to be related to pain med use at current time- no further issues noted.  Marland Kitchen RECTAL BLEEDING 01/04/2008    06-18-15 no problems now.  . 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 METATARSALGIA 11/08/2007    work related- no recent issues"wearing steel toe shoes at the time"  . Tibialis tendinitis 05/13/2010  . CONSTIPATION 02/21/2008    uses fiber to control  . VENTRICULAR HYPERTROPHY, LEFT 09/23/2010  . HYPERTENSION 08/05/2010  . ABNORMAL ELECTROCARDIOGRAM 08/05/2010    02-19-15 EKG shows NSR. Pt voices no issues with heart related problems.  . ACNE, MILD 02/17/2008    not a problem so much 06-18-15  . Impaired glucose tolerance 01/11/2011  . PONV (postoperative nausea and vomiting)   . Wears glasses   . HYPERGLYCEMIA   . Tendonitis     right wrist-has soft splint in place.  Marland Kitchen GERD 01/25/2008    uses Prevacid as needed   Discharge Diagnoses:   Principal Problem:   OA (osteoarthritis) of knee  Estimated body mass index is 28.34 kg/(m^2) as calculated from the following:   Height as of this encounter: '5\' 7"'  (1.702 m).   Weight as of this encounter: 82.101 kg (181 lb).  Procedure:  Procedure(s) (LRB): RIGHT TOTAL KNEE ARTHROPLASTY (Right)   Consults: None  HPI: Laura [name] is a 59 y.o. year old female with end stage OA of her right knee with progressively worsening pain and dysfunction. She has constant pain, with activity and at rest and significant functional deficits with difficulties even with ADLs. She has had extensive  non-op management including analgesics, injections of cortisone, and home exercise program, but remains in significant pain with significant dysfunction.Radiographs show bone on bone arthritis medial. She presents now for right Total Knee Arthroplasty.  Laboratory Data: Admission on 06/24/2015  Component Date Value Ref Range Status  . WBC 06/25/2015 11.8* 4.0 - 10.5 K/uL Final  . RBC 06/25/2015 3.43* 3.87 - 5.11 MIL/uL Final  . Hemoglobin 06/25/2015 10.4* 12.0 - 15.0 g/dL Final  . HCT 06/25/2015 31.4* 36.0 - 46.0 % Final  . MCV 06/25/2015 91.5  78.0 - 100.0 fL Final  . MCH 06/25/2015 30.3  26.0 - 34.0 pg Final  . MCHC 06/25/2015 33.1  30.0 - 36.0 g/dL Final  . RDW 06/25/2015 13.9  11.5 - 15.5 % Final  . Platelets 06/25/2015 239  150 - 400 K/uL Final  . Sodium 06/25/2015 140  135 - 145 mmol/L Final  . Potassium 06/25/2015 3.8  3.5 - 5.1 mmol/L Final  . Chloride 06/25/2015 110  101 - 111 mmol/L Final  . CO2 06/25/2015 25  22 - 32 mmol/L Final  . Glucose, Bld 06/25/2015 160* 65 - 99 mg/dL Final  . BUN 06/25/2015 8  6 - 20 mg/dL Final  . Creatinine, Ser 06/25/2015 0.51  0.44 - 1.00 mg/dL Final  . Calcium 06/25/2015 8.7* 8.9 - 10.3 mg/dL Final  . GFR calc non Af Amer 06/25/2015 >60  >60 mL/min Final  . GFR calc Af Amer 06/25/2015 >60  >60 mL/min  Final   Comment: (NOTE) The eGFR has been calculated using the CKD EPI equation. This calculation has not been validated in all clinical situations. eGFR's persistently <60 mL/min signify possible Chronic Kidney Disease.   Georgiann Hahn gap 06/25/2015 5  5 - 15 Final  Hospital Outpatient Visit on 06/18/2015  Component Date Value Ref Range Status  . MRSA, PCR 06/18/2015 INVALID RESULTS, SPECIMEN SENT FOR CULTURE* NEGATIVE Final   Comment: RESULT CALLED TO, READ BACK BY AND VERIFIED WITH: HENDRICK,W @ 1402 ON 100416 BY POTEAT,S   . Staphylococcus aureus 06/18/2015 INVALID RESULTS, SPECIMEN SENT FOR CULTURE* NEGATIVE Final   Comment:        The Xpert  SA Assay (FDA approved for NASAL specimens in patients over 91 years of age), is one component of a comprehensive surveillance program.  Test performance has been validated by [facility] for patients greater than or equal to 73 year old. It is not intended to diagnose infection nor to guide or monitor treatment. RESULT CALLED TO, READ BACK BY AND VERIFIED WITH: HENDRICK,W @ 1402 ON 100416 BY POTEAT,S   . aPTT 06/18/2015 32  24 - 37 seconds Final  . WBC 06/18/2015 7.0  4.0 - 10.5 K/uL Final  . RBC 06/18/2015 4.58  3.87 - 5.11 MIL/uL Final  . Hemoglobin 06/18/2015 13.6  12.0 - 15.0 g/dL Final  . HCT 06/18/2015 41.2  36.0 - 46.0 % Final  . MCV 06/18/2015 90.0  78.0 - 100.0 fL Final  . MCH 06/18/2015 29.7  26.0 - 34.0 pg Final  . MCHC 06/18/2015 33.0  30.0 - 36.0 g/dL Final  . RDW 06/18/2015 13.5  11.5 - 15.5 % Final  . Platelets 06/18/2015 278  150 - 400 K/uL Final  . Sodium 06/18/2015 142  135 - 145 mmol/L Final  . Potassium 06/18/2015 3.8  3.5 - 5.1 mmol/L Final  . Chloride 06/18/2015 108  101 - 111 mmol/L Final  . CO2 06/18/2015 25  22 - 32 mmol/L Final  . Glucose, Bld 06/18/2015 103* 65 - 99 mg/dL Final  . BUN 06/18/2015 10  6 - 20 mg/dL Final  . Creatinine, Ser 06/18/2015 0.48  0.44 - 1.00 mg/dL Final  . Calcium 06/18/2015 9.4  8.9 - 10.3 mg/dL Final  . Total Protein 06/18/2015 7.6  6.5 - 8.1 g/dL Final  . Albumin 06/18/2015 4.5  3.5 - 5.0 g/dL Final  . AST 06/18/2015 32  15 - 41 U/L Final  . ALT 06/18/2015 32  14 - 54 U/L Final  . Alkaline Phosphatase 06/18/2015 89  38 - 126 U/L Final  . Total Bilirubin 06/18/2015 0.6  0.3 - 1.2 mg/dL Final  . GFR calc non Af Amer 06/18/2015 >60  >60 mL/min Final  . GFR calc Af Amer 06/18/2015 >60  >60 mL/min Final   Comment: (NOTE) The eGFR has been calculated using the CKD EPI equation. This calculation has not been validated in all clinical situations. eGFR's persistently <60 mL/min signify possible Chronic Kidney Disease.   .  Anion gap 06/18/2015 9  5 - 15 Final  . Prothrombin Time 06/18/2015 13.5  11.6 - 15.2 seconds Final  . INR 06/18/2015 1.01  0.00 - 1.49 Final  . ABO/RH(D) 06/18/2015 A POS   Final  . Antibody Screen 06/18/2015 NEG   Final  . Sample Expiration 06/18/2015 06/27/2015   Final  . Color, Urine 06/18/2015 YELLOW  YELLOW Final  . APPearance 06/18/2015 CLEAR  CLEAR Final  . Specific Gravity, Urine 06/18/2015  1.008  1.005 - 1.030 Final  . pH 06/18/2015 5.5  5.0 - 8.0 Final  . Glucose, UA 06/18/2015 NEGATIVE  NEGATIVE mg/dL Final  . Hgb urine dipstick 06/18/2015 NEGATIVE  NEGATIVE Final  . Bilirubin Urine 06/18/2015 NEGATIVE  NEGATIVE Final  . Ketones, ur 06/18/2015 NEGATIVE  NEGATIVE mg/dL Final  . Protein, ur 06/18/2015 NEGATIVE  NEGATIVE mg/dL Final  . Urobilinogen, UA 06/18/2015 0.2  0.0 - 1.0 mg/dL Final  . Nitrite 06/18/2015 NEGATIVE  NEGATIVE Final  . Leukocytes, UA 06/18/2015 NEGATIVE  NEGATIVE Final   MICROSCOPIC NOT DONE ON URINES WITH NEGATIVE PROTEIN, BLOOD, LEUKOCYTES, NITRITE, OR GLUCOSE <1000 mg/dL.  . ABO/RH(D) 06/18/2015 A POS   Final  . Specimen Description 06/18/2015 NOSE   Final  . Special Requests 06/18/2015 NONE   Final  . Culture 06/18/2015    Final                   Value:NOMRSA Performed at [facility]   . Report Status 06/18/2015 06/20/2015 FINAL   Final     X-Rays:No results found.  EKG: Orders placed or performed in visit on 02/19/15  . EKG 12-Lead     Hospital Course: Laura [name] is a 59 y.o. who was admitted to [facility]. They were brought to the operating room on 06/24/2015 and underwent Procedure(s): RIGHT TOTAL KNEE ARTHROPLASTY.  Patient tolerated the procedure well and was later transferred to the recovery room and then to the orthopaedic floor for postoperative care.  They were given PO and IV analgesics for pain control following their surgery.  They were given 24 hours of postoperative antibiotics of  Anti-infectives    Start      Dose/Rate Route Frequency Ordered Stop   06/24/15 1600  ceFAZolin (ANCEF) IVPB 2 g/50 mL premix     2 g 100 mL/hr over 30 Minutes Intravenous Every 6 hours 06/24/15 1307 06/24/15 2138   06/24/15 0754  ceFAZolin (ANCEF) IVPB 2 g/50 mL premix     2 g 100 mL/hr over 30 Minutes Intravenous On call to O.R. 06/24/15 0754 06/24/15 1018     and started on DVT prophylaxis in the form of Xarelto.   PT and OT were ordered for total joint protocol.  Discharge planning consulted to help with postop disposition and equipment needs.  Patient had a good night on the evening of surgery.  They started to get up OOB with therapy on day one. Hemovac drain was pulled without difficulty.  Continued to work with therapy into day two.  Dressing was changed on day two and the incision was healing well.  Patient was seen in rounds and was ready to go home.  Discharge home with home health Diet - Cardiac diet Follow up - in 2 weeks Activity - WBAT Disposition - Home Condition Upon Discharge - Good D/C Meds - See DC Summary DVT Prophylaxis - Xarelto  Discharge Instructions    Call MD / Call 911    Complete by:  As directed   If you experience chest pain or shortness of breath, CALL 911 and be transported to the hospital emergency room.  If you develope a fever above 101 F, pus (white drainage) or increased drainage or redness at the wound, or calf pain, call your surgeon's office.     Change dressing    Complete by:  As directed   Change dressing daily with sterile 4 x 4 inch gauze dressing and apply TED hose. Do  not submerge the incision under water.     Constipation Prevention    Complete by:  As directed   Drink plenty of fluids.  Prune juice may be helpful.  You may use a stool softener, such as Colace (over the counter) 100 mg twice a day.  Use MiraLax (over the counter) for constipation as needed.     Diet - low sodium heart healthy    Complete by:  As directed      Discharge instructions    Complete by:   As directed   Pick up stool softner and laxative for home use following surgery while on pain medications. Do not submerge incision under water. Please use good hand washing techniques while changing dressing each day. May shower starting three days after surgery. Please use a clean towel to pat the incision dry following showers. Continue to use ice for pain and swelling after surgery. Do not use any lotions or creams on the incision until instructed by your surgeon.  Take Xarelto for two and a half more weeks, then discontinue Xarelto. Once the patient has completed the Xarelto, they may resume the 81 mg Aspirin.  Postoperative Constipation Protocol  Constipation - defined medically as fewer than three stools per week and severe constipation as less than one stool per week.  One of the most common issues patients have following surgery is constipation.  Even if you have a regular bowel pattern at home, your normal regimen is likely to be disrupted due to multiple reasons following surgery.  Combination of anesthesia, postoperative narcotics, change in appetite and fluid intake all can affect your bowels.  In order to avoid complications following surgery, here are some recommendations in order to help you during your recovery period.  Colace (docusate) - Pick up an over-the-counter form of Colace or another stool softener and take twice a day as long as you are requiring postoperative pain medications.  Take with a full glass of water daily.  If you experience loose stools or diarrhea, hold the colace until you stool forms back up.  If your symptoms do not get better within 1 week or if they get worse, check with your doctor.  Dulcolax (bisacodyl) - Pick up over-the-counter and take as directed by the product packaging as needed to assist with the movement of your bowels.  Take with a full glass of water.  Use this product as needed if not relieved by Colace only.   MiraLax (polyethylene  glycol) - Pick up over-the-counter to have on hand.  MiraLax is a solution that will increase the amount of water in your bowels to assist with bowel movements.  Take as directed and can mix with a glass of water, juice, soda, coffee, or tea.  Take if you go more than two days without a movement. Do not use MiraLax more than once per day. Call your doctor if you are still constipated or irregular after using this medication for 7 days in a row.  If you continue to have problems with postoperative constipation, please contact the office for further assistance and recommendations.  If you experience "the worst abdominal pain ever" or develop nausea or vomiting, please contact the office immediatly for further recommendations for treatment.     Do not put a pillow under the knee. Place it under the heel.    Complete by:  As directed      Do not sit on low chairs, stoools or toilet seats, as it may be  difficult to get up from low surfaces    Complete by:  As directed      Driving restrictions    Complete by:  As directed   No driving until released by the physician.     Increase activity slowly as tolerated    Complete by:  As directed      Lifting restrictions    Complete by:  As directed   No lifting until released by the physician.     Patient may shower    Complete by:  As directed   You may shower without a dressing once there is no drainage.  Do not wash over the wound.  If drainage remains, do not shower until drainage stops.     TED hose    Complete by:  As directed   Use stockings (TED hose) for 3 weeks on both leg(s).  You may remove them at night for sleeping.     Weight bearing as tolerated    Complete by:  As directed   Laterality:  right  Extremity:  Lower            Medication List    STOP taking these medications        aspirin EC 81 MG tablet     CALTRATE 600+D 600-400 MG-UNIT tablet  Generic drug:  Calcium Carbonate-Vitamin D     cyclobenzaprine 10 MG tablet    Commonly known as:  FLEXERIL     FISH OIL PO     multivitamin tablet     naproxen sodium 220 MG tablet  Commonly known as:  ANAPROX     vitamin E 400 UNIT capsule  Generic drug:  vitamin E      TAKE these medications        ADVAIR DISKUS 100-50 MCG/DOSE Aepb  Generic drug:  Fluticasone-Salmeterol  Inhale 1 puff into the lungs daily as needed (shortness of breath.). For shortness of breath.     Fiber Chew  Chew 2 tablets by mouth every morning.     lansoprazole 30 MG capsule  Commonly known as:  PREVACID  Take 30 mg by mouth daily as needed (acod reflux).     losartan 25 MG tablet  Commonly known as:  COZAAR  TAKE 1 TABLET (25 MG TOTAL) BY MOUTH DAILY.     meclizine 12.5 MG tablet  Commonly known as:  ANTIVERT  Take 1 tablet (12.5 mg total) by mouth 3 (three) times daily as needed for dizziness.     methocarbamol 500 MG tablet  Commonly known as:  ROBAXIN  Take 1 tablet (500 mg total) by mouth every 6 (six) hours as needed for muscle spasms.     oxyCODONE 5 MG immediate release tablet  Commonly known as:  Oxy IR/ROXICODONE  Take 1-2 tablets (5-10 mg total) by mouth every 3 (three) hours as needed for moderate pain or severe pain.     rivaroxaban 10 MG Tabs tablet  Commonly known as:  XARELTO  Take 1 tablet (10 mg total) by mouth daily with breakfast. Take Xarelto for two and a half more weeks, then discontinue Xarelto. Once the patient has completed the Xarelto, they may resume the 81 mg Aspirin.     traMADol 50 MG tablet  Commonly known as:  ULTRAM  Take 1-2 tablets (50-100 mg total) by mouth every 6 (six) hours as needed (mild pain).           Follow-up Information    Follow up with  Gearlean Alf, MD. Schedule an appointment as soon as possible for a visit on 07/09/2015.   Specialty:  Orthopedic Surgery   Why:  Call office at [phone] to setup appointment on Tuesday 07/09/2015 with Dr. Wynelle Link.   Contact information:   [address] [place] [address] [phone]       Signed: Arlee Muslim, PA-C Orthopaedic Surgery 06/25/2015, 8:51 PM

## 2015-06-25 NOTE — Progress Notes (Signed)
Physical Therapy Treatment Patient Details Name: EVANGELYN CROUSE MRN: [id number] DOB: [birth year] Today's Date: 06/25/2015    History of Present Illness 59 yo female s/p R TKA 06/24/15    PT Comments    Increased pain this session so walked a shorter distance. Pain rated 7/10. Plan is for d/c home on tomorrow.   Follow Up Recommendations  Home health PT;Supervision/Assistance - 24 hour     Equipment Recommendations  Rolling walker with 5" wheels    Recommendations for Other Services       Precautions / Restrictions Precautions Precautions: Knee Required Braces or Orthoses: Knee Immobilizer - Right Knee Immobilizer - Right: Discontinue once straight leg raise with < 10 degree lag Restrictions Weight Bearing Restrictions: No RLE Weight Bearing: Weight bearing as tolerated    Mobility  Bed Mobility Overal bed mobility: Needs Assistance Bed Mobility: Supine to Sit;Sit to Supine     Supine to sit: Min assist Sit to supine: Min assist   General bed mobility comments: Assist for R LE  Transfers Overall transfer level: Needs assistance Equipment used: Rolling walker (2 wheeled) Transfers: Sit to/from Stand Sit to Stand: Min guard         General transfer comment: close guard for safety. VCs safety, hand placement  Ambulation/Gait Ambulation/Gait assistance: Min guard Ambulation Distance (Feet): 35 Feet Assistive device: Rolling walker (2 wheeled) Gait Pattern/deviations: Step-to pattern;Antalgic     General Gait Details: close guard for safety. Decreased distance this session due to pain.    Stairs            Wheelchair Mobility    Modified Rankin (Stroke Patients Only)       Balance                                    Cognition Arousal/Alertness: Awake/alert Behavior During Therapy: WFL for tasks assessed/performed Overall Cognitive Status: Within Functional Limits for tasks assessed                      Exercises       General Comments        Pertinent Vitals/Pain Pain Assessment: 0-10 Pain Score: 7  Pain Location: R knee with activity Pain Descriptors / Indicators: Aching;Sore Pain Intervention(s): Monitored during session;Repositioned;Ice applied    Home Living                      Prior Function            PT Goals (current goals can now be found in the care plan section) Progress towards PT goals: Progressing toward goals    Frequency  7X/week    PT Plan Current plan remains appropriate    Co-evaluation             End of Session Equipment Utilized During Treatment: Gait belt;Right knee immobilizer Activity Tolerance: Patient tolerated treatment well Patient left: in bed;with call bell/phone within reach;with bed alarm set     Time: 9629-5284 PT Time Calculation (min) (ACUTE ONLY): 9 min  Charges:  $Gait Training: 8-22 mins                    G Codes:      Weston Anna, MPT Pager: [phone]

## 2015-06-25 NOTE — Evaluation (Signed)
Occupational Therapy Evaluation Patient Details Name: SUZY KUGEL MRN: [id number] DOB: [birth year] Today's Date: 06/25/2015    History of Present Illness RTKA   Clinical Impression   This 59 year old female was admitted for the above surgery. All education was completed.  No furtherOT is needed at this time.      Follow Up Recommendations  No OT follow up    Equipment Recommendations  None recommended by OT    Recommendations for Other Services       Precautions / Restrictions Precautions Precautions: Knee Required Braces or Orthoses: Knee Immobilizer - Right Knee Immobilizer - Right: Discontinue once straight leg raise with < 10 degree lag Restrictions Weight Bearing Restrictions: No      Mobility Bed Mobility         Supine to sit: Min assist     General bed mobility comments: assist with the r ight leg off the bed.  Transfers   Equipment used: Rolling walker (2 wheeled) Transfers: Sit to/from Stand Sit to Stand: [employer] transfer comment: cues for UE/LE placement    Balance                                            ADL Overall ADL's : Needs assistance/impaired                         Toilet Transfer: Min guard;Ambulation;BSC;RW   Toileting- Water quality scientist and Hygiene: Min guard;Sit to/from stand   Tub/ Shower Transfer: Min guard;Ambulation;Walk-in shower     General ADL Comments: pt had completed ADL with nursing and will have family assist her.  She does not have AE at home:  will have them assist her.       Vision     Perception     Praxis      Pertinent Vitals/Pain Pain Score: 7 (5 initially) Pain Descriptors / Indicators: Aching (R knee) Pain Intervention(s): Limited activity within patient's tolerance;Monitored during session;Premedicated before session;Repositioned;Ice applied     Hand Dominance     Extremity/Trunk Assessment Upper Extremity Assessment Upper  Extremity Assessment: Overall WFL for tasks assessed           Communication Communication Communication: No difficulties   Cognition Arousal/Alertness: Awake/alert Behavior During Therapy: WFL for tasks assessed/performed Overall Cognitive Status: Within Functional Limits for tasks assessed                     General Comments       Exercises       Shoulder Instructions      Home Living Family/patient expects to be discharged to:: Private residence Living Arrangements: Spouse/significant other;Children Available Help at Discharge: Family               Bathroom Shower/Tub: Walk-in Corporate treasurer Toilet: Standard     Home Equipment: Bedside commode;Shower seat (borrowed)          Prior Functioning/Environment Level of Independence: Independent             OT Diagnosis: Generalized weakness   OT Problem List:     OT Treatment/Interventions:      OT Goals(Current goals can be found in the care plan section) Acute Rehab OT Goals Patient Stated Goal: to go home  OT Frequency:  Barriers to D/C:            Co-evaluation              End of Session    Activity Tolerance: Patient tolerated treatment well Patient left: in bed;with call bell/phone within reach   Time: 0912-0938 OT Time Calculation (min): 26 min Charges:  OT General Charges $OT Visit: 1 Procedure OT Evaluation $Initial OT Evaluation Tier I: 1 Procedure OT Treatments $Self Care/Home Management : 8-22 mins G-Codes:    Anastasio Wogan 06-26-2015, 9:57 AM Lesle Chris, OTR/L [phone] 06/26/15

## 2015-06-25 NOTE — Progress Notes (Signed)
Physical Therapy Treatment Patient Details Name: Laura [name] MRN: [id number] DOB: [birth year] Today's Date: 06/25/2015    History of Present Illness 59 yo female s/p R TKA 06/24/15    PT Comments    Progressing with mobility. Pain rated 7-8/10 during session.   Follow Up Recommendations  Home health PT;Supervision/Assistance - 24 hour     Equipment Recommendations  Rolling walker with 5" wheels    Recommendations for Other Services       Precautions / Restrictions Precautions Precautions: Knee Required Braces or Orthoses: Knee Immobilizer - Right Knee Immobilizer - Right: Discontinue once straight leg raise with < 10 degree lag Restrictions Weight Bearing Restrictions: No RLE Weight Bearing: Weight bearing as tolerated    Mobility  Bed Mobility         Supine to sit: Min assist     General bed mobility comments: pt oob in recliner  Transfers Overall transfer level: Needs assistance Equipment used: Rolling walker (2 wheeled) Transfers: Sit to/from Stand Sit to Stand: Min guard         General transfer comment: close guard for safety. VCs safety, hand placement  Ambulation/Gait Ambulation/Gait assistance: Min guard Ambulation Distance (Feet): 55 Feet Assistive device: Rolling walker (2 wheeled) Gait Pattern/deviations: Step-to pattern     General Gait Details: close guard for safety.    Stairs            Wheelchair Mobility    Modified Rankin (Stroke Patients Only)       Balance                                    Cognition Arousal/Alertness: Awake/alert Behavior During Therapy: WFL for tasks assessed/performed Overall Cognitive Status: Within Functional Limits for tasks assessed                      Exercises Total Joint Exercises Ankle Circles/Pumps: AROM;Both;10 reps;Supine Quad Sets: AROM;Both;10 reps;Supine Heel Slides: AAROM;Right;10 reps;Supine Hip ABduction/ADduction: AAROM;Right;10  reps;Supine Straight Leg Raises: AAROM;Right;10 reps;Supine Goniometric ROM: ~10-50 degrees    General Comments        Pertinent Vitals/Pain Pain Assessment: 0-10 Pain Score: 8  Pain Location: R knee with activity Pain Descriptors / Indicators: Sore;Aching Pain Intervention(s): Monitored during session;Ice applied    Home Living Family/patient expects to be discharged to:: Private residence Living Arrangements: Spouse/significant other;Children Available Help at Discharge: Family         Home Equipment: Bedside commode;Shower seat (borrowed)      Prior Function Level of Independence: Independent          PT Goals (current goals can now be found in the care plan section) Acute Rehab PT Goals Patient Stated Goal: to go home Progress towards PT goals: Progressing toward goals    Frequency  7X/week    PT Plan Current plan remains appropriate    Co-evaluation             End of Session Equipment Utilized During Treatment: Gait belt;Right knee immobilizer Activity Tolerance: Patient tolerated treatment well Patient left: in chair;with call bell/phone within reach     Time: 1000-1019 PT Time Calculation (min) (ACUTE ONLY): 19 min  Charges:  $Gait Training: 8-22 mins                    G Codes:      Weston Anna, MPT Pager: [phone]

## 2015-06-25 NOTE — Discharge Instructions (Addendum)
° °Dr. Frank Aluisio °Total Joint Specialist °[facility] °[address] °[place], [place] [address] °[phone] ° °TOTAL KNEE REPLACEMENT POSTOPERATIVE DIRECTIONS ° °Knee Rehabilitation, Guidelines Following Surgery  °Results after knee surgery are often greatly improved when you follow the exercise, range of motion and muscle strengthening exercises prescribed by your doctor. Safety measures are also important to protect the knee from further injury. Any time any of these exercises cause you to have increased pain or swelling in your knee joint, decrease the amount until you are comfortable again and slowly increase them. If you have problems or questions, call your caregiver or physical therapist for advice.  ° °HOME CARE INSTRUCTIONS  °Remove items at home which could result in a fall. This includes throw rugs or furniture in walking pathways.  °· ICE to the affected knee every three hours for 30 minutes at a time and then as needed for pain and swelling.  Continue to use ice on the knee for pain and swelling from surgery. You may notice swelling that will progress down to the foot and ankle.  This is normal after surgery.  Elevate the leg when you are not up walking on it.   °· Continue to use the breathing machine which will help keep your temperature down.  It is common for your temperature to cycle up and down following surgery, especially at night when you are not up moving around and exerting yourself.  The breathing machine keeps your lungs expanded and your temperature down. °· Do not place pillow under knee, focus on keeping the knee straight while resting ° °DIET °You may resume your previous home diet once your are discharged from the hospital. ° °DRESSING / WOUND CARE / SHOWERING °You may shower 3 days after surgery, but keep the wounds dry during showering.  You may use an occlusive plastic wrap (Press'n Seal for example), NO SOAKING/SUBMERGING IN THE BATHTUB.  If the  bandage gets wet, change with a clean dry gauze.  If the incision gets wet, pat the wound dry with a clean towel. °You may start showering once you are discharged home but do not submerge the incision under water. Just pat the incision dry and apply a dry gauze dressing on daily. °Change the surgical dressing daily and reapply a dry dressing each time. ° °ACTIVITY °Walk with your walker as instructed. °Use walker as long as suggested by your caregivers. °Avoid periods of inactivity such as sitting longer than an hour when not asleep. This helps prevent blood clots.  °You may resume a sexual relationship in one month or when given the OK by your doctor.  °You may return to work once you are cleared by your doctor.  °Do not drive a car for 6 weeks or until released by you surgeon.  °Do not drive while taking narcotics. ° °WEIGHT BEARING °Weight bearing as tolerated with assist device (walker, cane, etc) as directed, use it as long as suggested by your surgeon or therapist, typically at least 4-6 weeks. ° °POSTOPERATIVE CONSTIPATION PROTOCOL °Constipation - defined medically as fewer than three stools per week and severe constipation as less than one stool per week. ° °One of the most common issues patients have following surgery is constipation.  Even if you have a regular bowel pattern at home, your normal regimen is likely to be disrupted due to multiple reasons following surgery.  Combination of anesthesia, postoperative narcotics, change in appetite and fluid intake all can affect your bowels.    In order to avoid complications following surgery, here are some recommendations in order to help you during your recovery period. ° °Colace (docusate) - Pick up an over-the-counter form of Colace or another stool softener and take twice a day as long as you are requiring postoperative pain medications.  Take with a full glass of water daily.  If you experience loose stools or diarrhea, hold the colace until you stool forms  back up.  If your symptoms do not get better within 1 week or if they get worse, check with your doctor. ° °Dulcolax (bisacodyl) - Pick up over-the-counter and take as directed by the product packaging as needed to assist with the movement of your bowels.  Take with a full glass of water.  Use this product as needed if not relieved by Colace only.  ° °MiraLax (polyethylene glycol) - Pick up over-the-counter to have on hand.  MiraLax is a solution that will increase the amount of water in your bowels to assist with bowel movements.  Take as directed and can mix with a glass of water, juice, soda, coffee, or tea.  Take if you go more than two days without a movement. °Do not use MiraLax more than once per day. Call your doctor if you are still constipated or irregular after using this medication for 7 days in a row. ° °If you continue to have problems with postoperative constipation, please contact the office for further assistance and recommendations.  If you experience "the worst abdominal pain ever" or develop nausea or vomiting, please contact the office immediatly for further recommendations for treatment. ° °ITCHING ° If you experience itching with your medications, try taking only a single pain pill, or even half a pain pill at a time.  You can also use Benadryl over the counter for itching or also to help with sleep.  ° °TED HOSE STOCKINGS °Wear the elastic stockings on both legs for three weeks following surgery during the day but you may remove then at night for sleeping. ° °MEDICATIONS °See your medication summary on the “After Visit Summary” that the nursing staff will review with you prior to discharge.  You may have some home medications which will be placed on hold until you complete the course of blood thinner medication.  It is important for you to complete the blood thinner medication as prescribed by your surgeon.  Continue your approved medications as instructed at time of  discharge. ° °PRECAUTIONS °If you experience chest pain or shortness of breath - call 911 immediately for transfer to the hospital emergency department.  °If you develop a fever greater that 101 F, purulent drainage from wound, increased redness or drainage from wound, foul odor from the wound/dressing, or calf pain - CONTACT YOUR SURGEON.   °                                                °FOLLOW-UP APPOINTMENTS °Make sure you keep all of your appointments after your operation with your surgeon and caregivers. You should call the office at the above phone number and make an appointment for approximately two weeks after the date of your surgery or on the date instructed by your surgeon outlined in the "After Visit Summary". ° ° °RANGE OF MOTION AND STRENGTHENING EXERCISES  °Rehabilitation of the knee is important following a knee injury or   an operation. After just a few days of immobilization, the muscles of the thigh which control the knee become weakened and shrink (atrophy). Knee exercises are designed to build up the tone and strength of the thigh muscles and to improve knee motion. Often times heat used for twenty to thirty minutes before working out will loosen up your tissues and help with improving the range of motion but do not use heat for the first two weeks following surgery. These exercises can be done on a training (exercise) mat, on the floor, on a table or on a bed. Use what ever works the best and is most comfortable for you Knee exercises include:  °Leg Lifts - While your knee is still immobilized in a splint or cast, you can do straight leg raises. Lift the leg to 60 degrees, hold for 3 sec, and slowly lower the leg. Repeat 10-20 times 2-3 times daily. Perform this exercise against resistance later as your knee gets better.  °Quad and Hamstring Sets - Tighten up the muscle on the front of the thigh (Quad) and hold for 5-10 sec. Repeat this 10-20 times hourly. Hamstring sets are done by pushing the  foot backward against an object and holding for 5-10 sec. Repeat as with quad sets.  °· Leg Slides: Lying on your back, slowly slide your foot toward your buttocks, bending your knee up off the floor (only go as far as is comfortable). Then slowly slide your foot back down until your leg is flat on the floor again. °· Angel Wings: Lying on your back spread your legs to the side as far apart as you can without causing discomfort.  °A rehabilitation program following serious knee injuries can speed recovery and prevent re-injury in the future due to weakened muscles. Contact your doctor or a physical therapist for more information on knee rehabilitation.  ° °IF YOU ARE TRANSFERRED TO A SKILLED REHAB FACILITY °If the patient is transferred to a skilled rehab facility following release from the hospital, a list of the current medications will be sent to the facility for the patient to continue.  When discharged from the skilled rehab facility, please have the facility set up the patient's Home Health Physical Therapy prior to being released. Also, the skilled facility will be responsible for providing the patient with their medications at time of release from the facility to include their pain medication, the muscle relaxants, and their blood thinner medication. If the patient is still at the rehab facility at time of the two week follow up appointment, the skilled rehab facility will also need to assist the patient in arranging follow up appointment in our office and any transportation needs. ° °MAKE SURE YOU:  °Understand these instructions.  °Get help right away if you are not doing well or get worse.  ° ° °Pick up stool softner and laxative for home use following surgery while on pain medications. °Do not submerge incision under water. °Please use good hand washing techniques while changing dressing each day. °May shower starting three days after surgery. °Please use a clean towel to pat the incision dry following  showers. °Continue to use ice for pain and swelling after surgery. °Do not use any lotions or creams on the incision until instructed by your surgeon. ° °Take Xarelto for two and a half more weeks, then discontinue Xarelto. °Once the patient has completed the Xarelto, they may resume the 81 mg Aspirin. ° ° °Information on my medicine - XARELTO® (Rivaroxaban) ° °  This medication education was reviewed with me or my healthcare representative as part of my discharge preparation.  The pharmacist that spoke with me during my hospital stay was:  Minda Ditto, [facility]  Why was Xarelto prescribed for you? Xarelto was prescribed for you to reduce the risk of blood clots forming after orthopedic surgery. The medical term for these abnormal blood clots is venous thromboembolism (VTE).  What do you need to know about xarelto ? Take your Xarelto ONCE DAILY at the same time every day. You may take it either with or without food.  If you have difficulty swallowing the tablet whole, you may crush it and mix in applesauce just prior to taking your dose.  Take Xarelto exactly as prescribed by your doctor and DO NOT stop taking Xarelto without talking to the doctor who prescribed the medication.  Stopping without other VTE prevention medication to take the place of Xarelto may increase your risk of developing a clot.  After discharge, you should have regular check-up appointments with your healthcare provider that is prescribing your Xarelto.    What do you do if you miss a dose? If you miss a dose, take it as soon as you remember on the same day then continue your regularly scheduled once daily regimen the next day. Do not take two doses of Xarelto on the same day.   Important Safety Information A possible side effect of Xarelto is bleeding. You should call your healthcare provider right away if you experience any of the following: ? Bleeding from an injury or your nose that does not stop. ? Unusual  colored urine (red or dark brown) or unusual colored stools (red or black). ? Unusual bruising for unknown reasons. ? A serious fall or if you hit your head (even if there is no bleeding).  Some medicines may interact with Xarelto and might increase your risk of bleeding while on Xarelto. To help avoid this, consult your healthcare provider or pharmacist prior to using any new prescription or non-prescription medications, including herbals, vitamins, non-steroidal anti-inflammatory drugs (NSAIDs) and supplements.  We discussed holding Aspirin till Xarelto completed, and that you are not taking Naproxen currently.  This website has more information on Xarelto: [email or web address]

## 2015-06-25 NOTE — Progress Notes (Signed)
   Subjective: 1 Day Post-Op Procedure(s) (LRB): RIGHT TOTAL KNEE ARTHROPLASTY (Right) Patient reports pain as mild.  She had a good night. Patient seen in rounds with Dr. Wynelle Link. Patient is well, but has had some minor complaints of pain in the knee, requiring pain medications We will start therapy today.  Plan is to go Home after hospital stay.  Objective: Vital signs in last 24 hours: Temp:  [97.2 F (36.2 C)-98.5 F (36.9 C)] 98.5 F (36.9 C) (10/11 0616) Pulse Rate:  [58-78] 59 (10/11 0616) Resp:  [15-28] 16 (10/11 0616) BP: (116-157)/(53-74) 116/63 mmHg (10/11 0616) SpO2:  [98 %-100 %] 98 % (10/11 0616) Weight:  [82.101 kg (181 lb)] 82.101 kg (181 lb) (10/10 0811)  Intake/Output from previous day:  Intake/Output Summary (Last 24 hours) at 06/25/15 0756 Last data filed at 06/25/15 0630  Gross per 24 hour  Intake 3502.12 ml  Output   3700 ml  Net -197.88 ml    Intake/Output this shift: UOP 550 since around [place]  Labs:  Recent Labs  06/25/15 0503  HGB 10.4*    Recent Labs  06/25/15 0503  WBC 11.8*  RBC 3.43*  HCT 31.4*  PLT 239    Recent Labs  06/25/15 0503  NA 140  K 3.8  CL 110  CO2 25  BUN 8  CREATININE 0.51  GLUCOSE 160*  CALCIUM 8.7*   No results for input(s): LABPT, INR in the last 72 hours.  EXAM General - Patient is Alert, Appropriate and Oriented Extremity - Neurovascular intact Sensation intact distally Dorsiflexion/Plantar flexion intact Dressing - dressing C/D/I Motor Function - intact, moving foot and toes well on exam.  Hemovac pulled without difficulty.  Past Medical History  Diagnosis Date  . ESOPHAGEAL STRICTURE 02/17/2008    dilation done - no problems since.  . ALLERGIC RHINITIS 01/25/2008  . ASTHMA 01/25/2008  . Unspecified disorder of liver 01/19/2008    evaluated and thought to be related to pain med use at current time- no further issues noted.  Marland Kitchen RECTAL BLEEDING 01/04/2008    06-18-15 no problems now.  . 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 METATARSALGIA 11/08/2007    work related- no recent issues"wearing steel toe shoes at the time"  . Tibialis tendinitis 05/13/2010  . CONSTIPATION 02/21/2008    uses fiber to control  . VENTRICULAR HYPERTROPHY, LEFT 09/23/2010  . HYPERTENSION 08/05/2010  . ABNORMAL ELECTROCARDIOGRAM 08/05/2010    02-19-15 EKG shows NSR. Pt voices no issues with heart related problems.  . ACNE, MILD 02/17/2008    not a problem so much 06-18-15  . Impaired glucose tolerance 01/11/2011  . PONV (postoperative nausea and vomiting)   . Wears glasses   . HYPERGLYCEMIA   . Tendonitis     right wrist-has soft splint in place.  Marland Kitchen GERD 01/25/2008    uses Prevacid as needed    Assessment/Plan: 1 Day Post-Op Procedure(s) (LRB): RIGHT TOTAL KNEE ARTHROPLASTY (Right) Principal Problem:   OA (osteoarthritis) of knee  Estimated body mass index is 28.34 kg/(m^2) as calculated from the following:   Height as of this encounter: 5\' 7"  (1.702 m).   Weight as of this encounter: 82.101 kg (181 lb). Advance diet Up with therapy Plan for discharge tomorrow Discharge home with home health  DVT Prophylaxis - Xarelto Weight-Bearing as tolerated to right leg D/C O2 and Pulse OX and try on Room Air  Laura Muslim, PA-C Orthopaedic Surgery 06/25/2015, 7:56 AM

## 2015-06-26 LAB — CBC
HCT: 29.3 % — ABNORMAL LOW (ref 36.0–46.0)
HEMOGLOBIN: 9.6 g/dL — AB (ref 12.0–15.0)
MCH: 30.2 pg (ref 26.0–34.0)
MCHC: 32.8 g/dL (ref 30.0–36.0)
MCV: 92.1 fL (ref 78.0–100.0)
Platelets: 209 10*3/uL (ref 150–400)
RBC: 3.18 MIL/uL — AB (ref 3.87–5.11)
RDW: 14.1 % (ref 11.5–15.5)
WBC: 11.3 10*3/uL — AB (ref 4.0–10.5)

## 2015-06-26 LAB — BASIC METABOLIC PANEL
ANION GAP: 7 (ref 5–15)
BUN: 13 mg/dL (ref 6–20)
CHLORIDE: 107 mmol/L (ref 101–111)
CO2: 29 mmol/L (ref 22–32)
Calcium: 8.7 mg/dL — ABNORMAL LOW (ref 8.9–10.3)
Creatinine, Ser: 0.57 mg/dL (ref 0.44–1.00)
GFR calc non Af Amer: >60 mL/min (ref >60)
Glucose, Bld: 105 mg/dL — ABNORMAL HIGH (ref 65–99)
POTASSIUM: 3.7 mmol/L (ref 3.5–5.1)
SODIUM: 143 mmol/L (ref 135–145)

## 2015-06-26 NOTE — Progress Notes (Signed)
Physical Therapy Treatment Patient Details Name: Laura [name] MRN: [id number] DOB: 30-Mar-1956 Today's Date: 06/26/2015    History of Present Illness 59 yo female s/p R TKA 06/24/15    PT Comments    Progressing well with mobility. Practiced/reviewed exercises, ambulation, and stair negotiation. All education completed. Ready to d/c from PT standpoint.   Follow Up Recommendations  Home health PT;Supervision/Assistance - 24 hour     Equipment Recommendations  Rolling walker with 5" wheels    Recommendations for Other Services       Precautions / Restrictions Precautions Precautions: Knee Required Braces or Orthoses: Knee Immobilizer - Right Knee Immobilizer - Right: Discontinue once straight leg raise with < 10 degree lag Restrictions Weight Bearing Restrictions: No RLE Weight Bearing: Weight bearing as tolerated    Mobility  Bed Mobility Overal bed mobility: Needs Assistance Bed Mobility: Supine to Sit;Sit to Supine     Supine to sit: Min guard Sit to supine: Min assist   General bed mobility comments: Assist for R LE back onto bed.   Transfers Overall transfer level: Needs assistance Equipment used: Rolling walker (2 wheeled) Transfers: Sit to/from Stand Sit to Stand: Min guard         General transfer comment: close guard for safety. VCs safety, hand placement  Ambulation/Gait Ambulation/Gait assistance: Min guard Ambulation Distance (Feet): 135 Feet Assistive device: Rolling walker (2 wheeled) Gait Pattern/deviations: Step-to pattern;Antalgic     General Gait Details: close guard for safety. Pt tolerated session well.    Stairs Stairs: Yes Stairs assistance: Min assist Stair Management: Step to pattern;Forwards;One rail Right Number of Stairs: 2 General stair comments: Practiced with 1 rail, 1 HHA. VCs safety, technique, sequence.   Wheelchair Mobility    Modified Rankin (Stroke Patients Only)       Balance                                     Cognition Arousal/Alertness: Awake/alert Behavior During Therapy: WFL for tasks assessed/performed Overall Cognitive Status: Within Functional Limits for tasks assessed                      Exercises Total Joint Exercises Ankle Circles/Pumps: AROM;Both;10 reps;Supine Quad Sets: AROM;Both;10 reps;Supine Heel Slides: AAROM;Right;10 reps;Supine Hip ABduction/ADduction: AAROM;Right;10 reps;Supine Straight Leg Raises: AAROM;Right;10 reps;Supine Goniometric ROM: ~10-55 degrees    General Comments        Pertinent Vitals/Pain Pain Assessment: 0-10 Pain Score: 6  Pain Location: R knee with activity Pain Descriptors / Indicators: Aching;Sore Pain Intervention(s): Monitored during session;Ice applied;Repositioned    Home Living                      Prior Function            PT Goals (current goals can now be found in the care plan section) Progress towards PT goals: Progressing toward goals    Frequency  7X/week    PT Plan Current plan remains appropriate    Co-evaluation             End of Session Equipment Utilized During Treatment: Gait belt Activity Tolerance: Patient tolerated treatment well Patient left: in bed;with call bell/phone within reach     Time: 0908-0925 PT Time Calculation (min) (ACUTE ONLY): 17 min  Charges:  $Gait Training: 8-22 mins  G Codes:      Weston Anna, MPT Pager: [phone]

## 2015-06-26 NOTE — Care Management Note (Signed)
Case Management Note  Patient Details  Name: TANAKA GILLEN MRN: [id number] Date of Birth: Jan 22, 1956  Subjective/Objective:                  RIGHT TOTAL KNEE ARTHROPLASTY (Right)  Action/Plan: Discharge planning  Expected Discharge Date:  06/26/15               Expected Discharge Plan:  [place]  In-House Referral:     Discharge planning Services  CM Consult  Post Acute Care Choice:  Home Health Choice offered to:     DME Arranged:  N/A DME Agency:     HH Arranged:  PT HH Agency:  [facility]  Status of Service:     Medicare Important Message Given:    Date Medicare IM Given:    Medicare IM give by:    Date Additional Medicare IM Given:    Additional Medicare Important Message give by:     If discussed at [place] of Stay Meetings, dates discussed:    Additional Comments: CM met with pt in room to offer choice of home health agency.  Pt chooses [facility] to render HHPT.  Address and contact information verified by pt.  Referral emailed to [employer], Tim.  Pt has rolling walker, 3n1, shower seat at home.  No other CM needs were communicated. Dellie Catholic, RN 06/26/2015, 8:47 AM

## 2015-06-26 NOTE — Progress Notes (Signed)
Pt to d/c home with [facility] home health. No DME needs. AVS reviewed and "My Chart" discussed with pt. Pt capable of verbalizing medications, dressing changes, signs and symptoms of infection, and follow-up appointments. Remains hemodynamically stable. No signs and symptoms of distress. Educated pt to return to ER in the case of SOB, dizziness, or chest pain.  

## 2015-06-26 NOTE — Progress Notes (Signed)
   Subjective: 2 Days Post-Op Procedure(s) (LRB): RIGHT TOTAL KNEE ARTHROPLASTY (Right) Patient reports pain as mild.   Patient seen in rounds with Dr. Wynelle Link. Patient is well, and has had no acute complaints or problems Patient is ready to go home later today.  Objective: Vital signs in last 24 hours: Temp:  [98.3 F (36.8 C)-98.5 F (36.9 C)] 98.3 F (36.8 C) (10/11 2128) Pulse Rate:  [58-76] 76 (10/11 2128) Resp:  [18-20] 20 (10/11 2128) BP: (118-150)/(52-69) 150/53 mmHg (10/11 2128) SpO2:  [96 %-98 %] 98 % (10/11 2128)  Intake/Output from previous day:  Intake/Output Summary (Last 24 hours) at 06/26/15 0712 Last data filed at 06/25/15 2100  Gross per 24 hour  Intake   1080 ml  Output   1500 ml  Net   -420 ml    Labs:  Recent Labs  06/25/15 0503 06/26/15 0612  HGB 10.4* 9.6*    Recent Labs  06/25/15 0503 06/26/15 0612  WBC 11.8* 11.3*  RBC 3.43* 3.18*  HCT 31.4* 29.3*  PLT 239 209    Recent Labs  06/25/15 0503 06/26/15 0612  NA 140 143  K 3.8 3.7  CL 110 107  CO2 25 29  BUN 8 13  CREATININE 0.51 0.57  GLUCOSE 160* 105*  CALCIUM 8.7* 8.7*   No results for input(s): LABPT, INR in the last 72 hours.  EXAM: General - Patient is Alert and Appropriate Extremity - Neurovascular intact Sensation intact distally Dorsiflexion/Plantar flexion intact Incision - clean, dry, no drainage Motor Function - intact, moving foot and toes well on exam.   Assessment/Plan: 2 Days Post-Op Procedure(s) (LRB): RIGHT TOTAL KNEE ARTHROPLASTY (Right) Procedure(s) (LRB): RIGHT TOTAL KNEE ARTHROPLASTY (Right) Past Medical History  Diagnosis Date  . ESOPHAGEAL STRICTURE 02/17/2008    dilation done - no problems since.  . ALLERGIC RHINITIS 01/25/2008  . ASTHMA 01/25/2008  . Unspecified disorder of liver 01/19/2008    evaluated and thought to be related to pain med use at current time- no further issues noted.  Marland Kitchen RECTAL BLEEDING 01/04/2008    06-18-15 no problems now.    . 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 METATARSALGIA 11/08/2007    work related- no recent issues"wearing steel toe shoes at the time"  . Tibialis tendinitis 05/13/2010  . CONSTIPATION 02/21/2008    uses fiber to control  . VENTRICULAR HYPERTROPHY, LEFT 09/23/2010  . HYPERTENSION 08/05/2010  . ABNORMAL ELECTROCARDIOGRAM 08/05/2010    02-19-15 EKG shows NSR. Pt voices no issues with heart related problems.  . ACNE, MILD 02/17/2008    not a problem so much 06-18-15  . Impaired glucose tolerance 01/11/2011  . PONV (postoperative nausea and vomiting)   . Wears glasses   . HYPERGLYCEMIA   . Tendonitis     right wrist-has soft splint in place.  Marland Kitchen GERD 01/25/2008    uses Prevacid as needed   Principal Problem:   OA (osteoarthritis) of knee  Estimated body mass index is 28.34 kg/(m^2) as calculated from the following:   Height as of this encounter: 5\' 7"  (1.702 m).   Weight as of this encounter: 82.101 kg (181 lb). Up with therapy Discharge home with home health Diet - Cardiac diet Follow up - in 2 weeks Activity - WBAT Disposition - Home Condition Upon Discharge - Good D/C Meds - See DC Summary DVT Prophylaxis - Xarelto  Arlee Muslim, PA-C Orthopaedic Surgery 06/26/2015, 7:12 AM

## 2015-07-22 ENCOUNTER — Ambulatory Visit (HOSPITAL_COMMUNITY)
Admission: RE | Admit: 2015-07-22 | Discharge: 2015-07-22 | Disposition: A | Discharge status: Home / Self Care | Payer: BLUE CROSS/BLUE SHIELD | Source: Ambulatory Visit | Attending: Cardiovascular Disease | Admitting: Cardiovascular Disease

## 2015-07-22 ENCOUNTER — Other Ambulatory Visit (HOSPITAL_COMMUNITY): Payer: Self-pay | Admitting: Sports Medicine

## 2015-07-22 DIAGNOSIS — M7989 Other specified soft tissue disorders: Secondary | ICD-10-CM

## 2015-07-22 DIAGNOSIS — I10 Essential (primary) hypertension: Secondary | ICD-10-CM | POA: Insufficient documentation

## 2015-07-22 DIAGNOSIS — M79604 Pain in right leg: Secondary | ICD-10-CM

## 2015-08-08 ENCOUNTER — Other Ambulatory Visit: Payer: Self-pay | Admitting: Endocrinology

## 2015-08-12 ENCOUNTER — Telehealth: Payer: Self-pay | Admitting: Endocrinology

## 2015-08-12 NOTE — Telephone Encounter (Signed)
Rx submitted per pt's request.  

## 2015-08-12 NOTE — Telephone Encounter (Signed)
Patient called and would like a refill on her medication  She has been down due to surgery   Rx: Losartan    Pharmacy: [facility]    Thank you

## 2015-10-12 ENCOUNTER — Other Ambulatory Visit (HOSPITAL_COMMUNITY)
Admission: RE | Admit: 2015-10-12 | Discharge: 2015-10-12 | Disposition: A | Discharge status: Home / Self Care | Payer: BLUE CROSS/BLUE SHIELD | Source: Ambulatory Visit | Attending: Emergency Medicine | Admitting: Emergency Medicine

## 2015-10-12 ENCOUNTER — Emergency Department (HOSPITAL_COMMUNITY)
Admission: EM | Admit: 2015-10-12 | Discharge: 2015-10-12 | Disposition: A | Discharge status: Home / Self Care | Payer: BLUE CROSS/BLUE SHIELD | Source: Home / Self Care | Attending: Emergency Medicine | Admitting: Emergency Medicine

## 2015-10-12 ENCOUNTER — Encounter (HOSPITAL_COMMUNITY): Payer: Self-pay

## 2015-10-12 DIAGNOSIS — J111 Influenza due to unidentified influenza virus with other respiratory manifestations: Secondary | ICD-10-CM | POA: Insufficient documentation

## 2015-10-12 DIAGNOSIS — R69 Illness, unspecified: Principal | ICD-10-CM

## 2015-10-12 LAB — POCT URINALYSIS DIP (DEVICE)
Bilirubin Urine: NEGATIVE
GLUCOSE, UA: NEGATIVE mg/dL
Leukocytes, UA: NEGATIVE
Nitrite: NEGATIVE
PROTEIN: NEGATIVE mg/dL
SPECIFIC GRAVITY, URINE: 1.02 (ref 1.005–1.030)
UROBILINOGEN UA: 0.2 mg/dL (ref 0.0–1.0)
pH: 5.5 (ref 5.0–8.0)

## 2015-10-12 MED ORDER — HYDROCOD POLST-CPM POLST ER 10-8 MG/5ML PO SUER: 5.0000 mL | Freq: Two times a day (BID) | ORAL | Status: DC | PRN

## 2015-10-12 MED ORDER — OSELTAMIVIR PHOSPHATE 75 MG PO CAPS: 75.0000 mg | ORAL_CAPSULE | Freq: Two times a day (BID) | ORAL | Status: DC

## 2015-10-12 NOTE — Discharge Instructions (Signed)
Give [place] a working phone number so that we can contact you if we need to change your antibiotics. Continue the Macrobid and increased fluids.

## 2015-10-12 NOTE — ED Provider Notes (Signed)
HPI  SUBJECTIVE:  Laura [name] is a 60 y.o. female who presents with acute onset of chills, fever Tmax 101.1, cough starting this afternoon. She reports headache, nasal congestion, body aches. She is on day #3 of Macrobid for UTI. She states that the urinary urgency, frequency, dysuria and hematuria have resolved. She reports diffuse anterior abdominal pain described as soreness which is worse with coughing and movement. No vomiting, ear pain, sinus pain/pressure, neck stiffness, neck pain, rash, back pain. No other abdominal pain. No cloudy or odorous urine. No chest pain, wheezing, shortness of breath. No sore throat. She did not get a flu shot this year. No antipyretic in the past 4-6 hours. She has sick contacts, husband has similar symptoms. There are no other aggravating or alleviating factors. She tried Advil at 0930 this morning. Past medical history of asthma, states she uses albuterol as needed, hypertension, UTI. No history of diabetes, pyelonephritis. PMD: Dr. Loanne Drilling    Past Medical History  Diagnosis Date  . ESOPHAGEAL STRICTURE 02/17/2008    dilation done - no problems since.  . ALLERGIC RHINITIS 01/25/2008  . ASTHMA 01/25/2008  . Unspecified disorder of liver 01/19/2008    evaluated and thought to be related to pain med use at current time- no further issues noted.  Marland Kitchen RECTAL BLEEDING 01/04/2008    06-18-15 no problems now.  . 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 METATARSALGIA 11/08/2007    work related- no recent issues"wearing steel toe shoes at the time"  . Tibialis tendinitis 05/13/2010  . CONSTIPATION 02/21/2008    uses fiber to control  . VENTRICULAR HYPERTROPHY, LEFT 09/23/2010  . HYPERTENSION 08/05/2010  . ABNORMAL ELECTROCARDIOGRAM 08/05/2010    02-19-15 EKG shows NSR. Pt voices no issues with heart related problems.  . ACNE, MILD 02/17/2008    not a problem so much 06-18-15  . Impaired glucose tolerance 01/11/2011  . PONV (postoperative nausea and vomiting)   .  Wears glasses   . HYPERGLYCEMIA   . Tendonitis     right wrist-has soft splint in place.  Marland Kitchen GERD 01/25/2008    uses Prevacid as needed    Past Surgical History  Procedure Laterality Date  . Esophagogastroduodenoscopy  04/01/1998  . Nasal sinus surgery      s/p  . Ovarian cyst removal    . Arthroscopic repair acl Right 10/1998    right knee   . US echocardiography  2011  . Cardiovascular stress test  2011  . Orif elbow fracture  12/23/2011    Procedure: OPEN REDUCTION INTERNAL FIXATION (ORIF) ELBOW/OLECRANON FRACTURE;  Surgeon: Linna Hoff, MD;  Location: [place];  Service: Orthopedics;  Laterality: Left;  LEFT ELBOW ORIF OF PROXIMAL OLECRANON AND RADIAL HEAD, POSSIBLE ARTHROPLASTY  . Elbow arthroplasty  2014    left  . Carpal tunnel release  2014    left  . Abdominal hysterectomy  2002    TAH-LTSO-LOA  . Dilation and curettage of uterus    . Knee arthroscopy Bilateral     meniscus tear-left.  . Cholecystectomy  1985    open  . Breast surgery Left     lumpectomy-benign 2015  . Colonoscopy w/ polypectomy    . Total knee arthroplasty Right 06/24/2015    Procedure: RIGHT TOTAL KNEE ARTHROPLASTY;  Surgeon: Gaynelle Arabian, MD;  Location: [facility];  Service: Orthopedics;  Laterality: Right;    Family History  Problem Relation Age of Onset  . Diabetes Mother   . Thyroid disease  Mother     hypothyroidism  . Cancer Other     Pancreatic Cancer-Aunt    Social History  Substance Use Topics  . Smoking status: Never Smoker   . Smokeless tobacco: None  . Alcohol Use: No    No current facility-administered medications for this encounter.  Current outpatient prescriptions:  .  chlorpheniramine-HYDROcodone (TUSSIONEX PENNKINETIC ER) 10-8 MG/5ML SUER, Take 5 mLs by mouth every 12 (twelve) hours as needed for cough., Disp: 120 mL, Rfl: 0 .  Fiber CHEW, Chew 2 tablets by mouth every morning. , Disp: , Rfl:  .  Fluticasone-Salmeterol (ADVAIR DISKUS) 100-50 MCG/DOSE AEPB, Inhale 1 puff  into the lungs daily as needed (shortness of breath.). For shortness of breath., Disp: , Rfl:  .  lansoprazole (PREVACID) 30 MG capsule, Take 30 mg by mouth daily as needed (acod reflux). , Disp: , Rfl:  .  losartan (COZAAR) 25 MG tablet, TAKE 1 TABLET (25 MG TOTAL) BY MOUTH DAILY., Disp: 90 tablet, Rfl: 3 .  methocarbamol (ROBAXIN) 500 MG tablet, Take 1 tablet (500 mg total) by mouth every 6 (six) hours as needed for muscle spasms., Disp: 80 tablet, Rfl: 0 .  oseltamivir (TAMIFLU) 75 MG capsule, Take 1 capsule (75 mg total) by mouth 2 (two) times daily. X 5 days, Disp: 10 capsule, Rfl: 0 .  rivaroxaban (XARELTO) 10 MG TABS tablet, Take 1 tablet (10 mg total) by mouth daily with breakfast. Take Xarelto for two and a half more weeks, then discontinue Xarelto. Once the patient has completed the Xarelto, they may resume the 81 mg Aspirin., Disp: 19 tablet, Rfl: 0 .  [DISCONTINUED] olmesartan (BENICAR) 20 MG tablet, Take 20 mg by mouth daily.  , Disp: , Rfl:   No Known Allergies   ROS  As noted in HPI.   Physical Exam  BP 136/72 mmHg  Pulse 97  Temp(Src) 99.3 F (37.4 C) (Oral)  Resp 20  SpO2 100%  Constitutional: Well developed, well nourished, no acute distress Eyes: PERRL, EOMI, conjunctiva normal bilaterally HENT: Normocephalic, atraumatic,mucus membranes moist TMs normal bilaterally. Minimal nasal congestion, normal nares. No sinus tenderness. Normal oropharynx. Uvula midline Lymph: No cervical lymphadenopathy Respiratory: Clear to auscultation bilaterally, no rales, no wheezing, no rhonchi. No chest wall tenderness Cardiovascular: Normal rate and rhythm, no murmurs, no gallops, no rubs GI: Soft, nondistended, normal bowel sounds, mild suprapubic tenderness, mild anterior abdominal wall tenderness no rebound, no guarding Back: no CVAT skin: No rash, skin intact Musculoskeletal: No edema, no tenderness, no deformities Neurologic: Alert & oriented x 3, CN II-XII grossly intact, no  motor deficits, sensation grossly intact Psychiatric: Speech and behavior appropriate   ED Course   Medications - No data to display  Orders Placed This Encounter  Procedures  . Urine culture    Standing Status: Standing     Number of Occurrences: 1     Standing Expiration Date:     Order Specific Question:  List patient's active antibiotics    Answer:  macrobid  . POCT urinalysis dip (device)    Standing Status: Standing     Number of Occurrences: 1     Standing Expiration Date:    Results for orders placed or performed during the hospital encounter of 10/12/15 (from the past 24 hour(s))  POCT urinalysis dip (device)     Status: Abnormal   Collection Time: 10/12/15  8:10 PM  Result Value Ref Range   Glucose, UA NEGATIVE NEGATIVE mg/dL   Bilirubin Urine NEGATIVE NEGATIVE  Ketones, ur TRACE (A) NEGATIVE mg/dL   Specific Gravity, Urine 1.020 1.005 - 1.030   Hgb urine dipstick TRACE (A) NEGATIVE   pH 5.5 5.0 - 8.0   Protein, ur NEGATIVE NEGATIVE mg/dL   Urobilinogen, UA 0.2 0.0 - 1.0 mg/dL   Nitrite NEGATIVE NEGATIVE   Leukocytes, UA NEGATIVE NEGATIVE   No results found.  ED Clinical Impression  Influenza-like illness   ED Assessment/Plan  We'll check UA, however, think that pyelonephritis is less likely given that her symptoms are improving, she has no CVA tenderness and also has a cough. Doubt pneumonia she is satting 100% on room air, lungs are clear bilaterally. Sending urine off for culture to confirm antibiotic choice. She is currently on Macrobid.Presentation is most consistent with influenza-like illness. Patient states she is plenty of albuterol at home with a spacer. She declined prescription. She will restart this, 2 puffs every 4-6 hours as needed for coughing. Also, Tussionex, Tamiflu. Follow-up with primary care physician as needed. Discussed signs and symptoms that should prompt return to the emergency department.   UA negative for UTI.  Discussed labs,  MDM, plan and followup with patient . Discussed sn/sx that should prompt return to the UC or ED. Patient  agrees with plan.   *This clinic note was created using Dragon dictation software. Therefore, there may be occasional mistakes despite careful proofreading.  ?   Melynda Ripple, MD 10/12/15 2136

## 2015-10-12 NOTE — ED Notes (Signed)
Patient states she started with a cough this am and now having chills and fever Patient is currently on macrobid for a bladder infection

## 2015-10-13 LAB — URINE CULTURE: Culture: 7000

## 2015-10-16 ENCOUNTER — Ambulatory Visit (INDEPENDENT_AMBULATORY_CARE_PROVIDER_SITE_OTHER): Payer: BLUE CROSS/BLUE SHIELD | Admitting: Endocrinology

## 2015-10-16 ENCOUNTER — Encounter: Payer: Self-pay | Admitting: Endocrinology

## 2015-10-16 VITALS — BP 132/80 | HR 83 | Temp 98.1°F | Ht 67.0 in | Wt 154.0 lb

## 2015-10-16 DIAGNOSIS — R509 Fever, unspecified: Secondary | ICD-10-CM

## 2015-10-16 LAB — CBC WITH DIFFERENTIAL/PLATELET
BASOS ABS: 0 10*3/uL (ref 0.0–0.1)
BASOS PCT: 0.5 % (ref 0.0–3.0)
Eosinophils Absolute: 0.7 10*3/uL (ref 0.0–0.7)
Eosinophils Relative: 9.5 % — ABNORMAL HIGH (ref 0.0–5.0)
HEMATOCRIT: 36.3 % (ref 36.0–46.0)
Hemoglobin: 11.9 g/dL — ABNORMAL LOW (ref 12.0–15.0)
LYMPHS ABS: 2.2 10*3/uL (ref 0.7–4.0)
LYMPHS PCT: 32.1 % (ref 12.0–46.0)
MCHC: 32.6 g/dL (ref 30.0–36.0)
MCV: 84.3 fl (ref 78.0–100.0)
MONOS PCT: 10.3 % (ref 3.0–12.0)
Monocytes Absolute: 0.7 10*3/uL (ref 0.1–1.0)
NEUTROS ABS: 3.3 10*3/uL (ref 1.4–7.7)
NEUTROS PCT: 47.6 % (ref 43.0–77.0)
PLATELETS: 281 10*3/uL (ref 150.0–400.0)
RBC: 4.31 Mil/uL (ref 3.87–5.11)
RDW: 14.6 % (ref 11.5–15.5)
WBC: 6.9 10*3/uL (ref 4.0–10.5)

## 2015-10-16 LAB — HEPATIC FUNCTION PANEL
ALBUMIN: 3.9 g/dL (ref 3.5–5.2)
ALK PHOS: 179 U/L — AB (ref 39–117)
ALT: 44 U/L — ABNORMAL HIGH (ref 0–35)
AST: 32 U/L (ref 0–37)
BILIRUBIN DIRECT: 0.1 mg/dL (ref 0.0–0.3)
BILIRUBIN TOTAL: 0.4 mg/dL (ref 0.2–1.2)
Total Protein: 7.2 g/dL (ref 6.0–8.3)

## 2015-10-16 NOTE — Patient Instructions (Signed)
blood tests are requested for you today.  We'll let you know about the results. Please finish the tamiflu I hope you feel better soon.  If you don't feel better by next week, please call back.  Please call sooner if you get worse.

## 2015-10-16 NOTE — Progress Notes (Signed)
Subjective:    Patient ID: Laura [name], female    DOB: October 14, 1955, 60 y.o.   MRN: [id number]  HPI Pt was seen in UC 4 days ago with a flu-like illness.  She was rx'ed with Tamiflu.  Since then, she feels somewhat better.  She has a slight cough in the chest, but no assoc n/v Past Medical History  Diagnosis Date  . ESOPHAGEAL STRICTURE 02/17/2008    dilation done - no problems since.  . ALLERGIC RHINITIS 01/25/2008  . ASTHMA 01/25/2008  . Unspecified disorder of liver 01/19/2008    evaluated and thought to be related to pain med use at current time- no further issues noted.  Marland Kitchen RECTAL BLEEDING 01/04/2008    06-18-15 no problems now.  . 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 METATARSALGIA 11/08/2007    work related- no recent issues"wearing steel toe shoes at the time"  . Tibialis tendinitis 05/13/2010  . CONSTIPATION 02/21/2008    uses fiber to control  . VENTRICULAR HYPERTROPHY, LEFT 09/23/2010  . HYPERTENSION 08/05/2010  . ABNORMAL ELECTROCARDIOGRAM 08/05/2010    02-19-15 EKG shows NSR. Pt voices no issues with heart related problems.  . ACNE, MILD 02/17/2008    not a problem so much 06-18-15  . Impaired glucose tolerance 01/11/2011  . PONV (postoperative nausea and vomiting)   . Wears glasses   . HYPERGLYCEMIA   . Tendonitis     right wrist-has soft splint in place.  Marland Kitchen GERD 01/25/2008    uses Prevacid as needed    Past Surgical History  Procedure Laterality Date  . Esophagogastroduodenoscopy  04/01/1998  . Nasal sinus surgery      s/p  . Ovarian cyst removal    . Arthroscopic repair acl Right 10/1998    right knee   . US echocardiography  2011  . Cardiovascular stress test  2011  . Orif elbow fracture  12/23/2011    Procedure: OPEN REDUCTION INTERNAL FIXATION (ORIF) ELBOW/OLECRANON FRACTURE;  Surgeon: Linna Hoff, MD;  Location: [place];  Service: Orthopedics;  Laterality: Left;  LEFT ELBOW ORIF OF PROXIMAL OLECRANON AND RADIAL HEAD, POSSIBLE ARTHROPLASTY  . Elbow  arthroplasty  2014    left  . Carpal tunnel release  2014    left  . Abdominal hysterectomy  2002    TAH-LTSO-LOA  . Dilation and curettage of uterus    . Knee arthroscopy Bilateral     meniscus tear-left.  . Cholecystectomy  1985    open  . Breast surgery Left     lumpectomy-benign 2015  . Colonoscopy w/ polypectomy    . Total knee arthroplasty Right 06/24/2015    Procedure: RIGHT TOTAL KNEE ARTHROPLASTY;  Surgeon: Gaynelle Arabian, MD;  Location: [facility];  Service: Orthopedics;  Laterality: Right;    Social History   Social History  . Marital Status: Married    Spouse Name: N/A  . Number of Children: N/A  . Years of Education: N/A   Occupational History  . Not on file.   Social History Main Topics  . Smoking status: Never Smoker   . Smokeless tobacco: Not on file  . Alcohol Use: No  . Drug Use: No  . Sexual Activity: Not on file   Other Topics Concern  . Not on file   Social History Narrative   Pt does not get regular exercise.   Daily Caffeine Use (12 oz Coke Zero per day)    Current Outpatient Prescriptions on File Prior to Visit  Medication Sig Dispense Refill  . Fiber CHEW Chew 2 tablets by mouth every morning.     . lansoprazole (PREVACID) 30 MG capsule Take 30 mg by mouth daily as needed (acod reflux).     Marland Kitchen losartan (COZAAR) 25 MG tablet TAKE 1 TABLET (25 MG TOTAL) BY MOUTH DAILY. 90 tablet 3  . oseltamivir (TAMIFLU) 75 MG capsule Take 1 capsule (75 mg total) by mouth 2 (two) times daily. X 5 days 10 capsule 0  . [DISCONTINUED] olmesartan (BENICAR) 20 MG tablet Take 20 mg by mouth daily.       No current facility-administered medications on file prior to visit.    No Known Allergies  Family History  Problem Relation Age of Onset  . Diabetes Mother   . Thyroid disease Mother     hypothyroidism  . Cancer Other     Pancreatic Cancer-Aunt    BP 132/80 mmHg  Pulse 83  Temp(Src) 98.1 F (36.7 C) (Oral)  Ht 5\' 7"  (1.702 m)  Wt 154 lb (69.854 kg)   BMI 24.11 kg/m2  SpO2 97%    Review of Systems Fever is improved but not resolved.  She has slight epigast pain.  She denies dysuria and rash.       Objective:   Physical Exam VITAL SIGNS:  See vs page GENERAL: no distress head: no deformity eyes: no periorbital swelling, no proptosis external nose and ears are normal mouth: no lesion seen Both eac's and tm's are normal LUNGS:  Clear to auscultation.  ABDOMEN: abdomen is soft, nontender.  no hepatosplenomegaly.  not distended.  no hernia.   Skin: no rash.  Not diaphoretic.       Assessment & Plan:  URI: new to me.  prob viral, but not sure.    Patient is advised the following: Patient Instructions  blood tests are requested for you today.  We'll let you know about the results. Please finish the tamiflu I hope you feel better soon.  If you don't feel better by next week, please call back.  Please call sooner if you get worse.

## 2016-04-21 ENCOUNTER — Other Ambulatory Visit: Payer: Self-pay | Admitting: General Surgery

## 2016-04-21 ENCOUNTER — Other Ambulatory Visit: Payer: Self-pay | Admitting: Nurse Practitioner

## 2016-04-21 ENCOUNTER — Other Ambulatory Visit: Payer: Self-pay | Admitting: Obstetrics and Gynecology

## 2016-04-21 DIAGNOSIS — Z1231 Encounter for screening mammogram for malignant neoplasm of breast: Secondary | ICD-10-CM

## 2016-05-22 ENCOUNTER — Ambulatory Visit
Admission: RE | Admit: 2016-05-22 | Discharge: 2016-05-22 | Disposition: A | Discharge status: Home / Self Care | Payer: BLUE CROSS/BLUE SHIELD | Source: Ambulatory Visit | Attending: Obstetrics and Gynecology | Admitting: Obstetrics and Gynecology

## 2016-05-22 DIAGNOSIS — Z1231 Encounter for screening mammogram for malignant neoplasm of breast: Secondary | ICD-10-CM

## 2016-07-26 NOTE — Progress Notes (Signed)
Subjective:    Patient ID: Laura [name], female    DOB: 03-30-1956, 60 y.o.   MRN: [id number]  HPI Pt is here for regular wellness examination, and is feeling pretty well in general, and says chronic med probs are stable, except as noted below.  Past Medical History:  Diagnosis Date  . ABNORMAL ELECTROCARDIOGRAM 08/05/2010   02-19-15 EKG shows NSR. Pt voices no issues with heart related problems.  . ACNE, MILD 02/17/2008   not a problem so much 06-18-15  . ALLERGIC RHINITIS 01/25/2008  . ASTHMA 01/25/2008  . 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 CONSTIPATION 02/21/2008   uses fiber to control  . ESOPHAGEAL STRICTURE 02/17/2008   dilation done - no problems since.  Marland Kitchen GERD 01/25/2008   uses Prevacid as needed  . HYPERGLYCEMIA   . HYPERTENSION 08/05/2010  . Impaired glucose tolerance 01/11/2011  . METATARSALGIA 11/08/2007   work related- no recent issues"wearing steel toe shoes at the time"  . PONV (postoperative nausea and vomiting)   . RECTAL BLEEDING 01/04/2008   06-18-15 no problems now.  . Tendonitis    right wrist-has soft splint in place.  . Tibialis tendinitis 05/13/2010  . 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  Marland Kitchen VENTRICULAR HYPERTROPHY, LEFT 09/23/2010  . Wears glasses     Past Surgical History:  Procedure Laterality Date  . ABDOMINAL HYSTERECTOMY  2002   TAH-LTSO-LOA  . ARTHROSCOPIC REPAIR ACL Right 10/1998   right knee   . BREAST SURGERY Left    lumpectomy-benign 2015  . CARDIOVASCULAR STRESS TEST  2011  . CARPAL TUNNEL RELEASE  2014   left  . CHOLECYSTECTOMY  1985   open  . COLONOSCOPY W/ POLYPECTOMY    . DILATION AND CURETTAGE OF UTERUS    . ELBOW ARTHROPLASTY  2014   left  . ESOPHAGOGASTRODUODENOSCOPY  04/01/1998  . KNEE ARTHROSCOPY Bilateral    meniscus tear-left.  Marland Kitchen NASAL SINUS SURGERY     s/p  . ORIF ELBOW FRACTURE  12/23/2011   Procedure: OPEN  REDUCTION INTERNAL FIXATION (ORIF) ELBOW/OLECRANON FRACTURE;  Surgeon: Linna Hoff, MD;  Location: [place];  Service: Orthopedics;  Laterality: Left;  LEFT ELBOW ORIF OF PROXIMAL OLECRANON AND RADIAL HEAD, POSSIBLE ARTHROPLASTY  . OVARIAN CYST REMOVAL    . TOTAL KNEE ARTHROPLASTY Right 06/24/2015   Procedure: RIGHT TOTAL KNEE ARTHROPLASTY;  Surgeon: Gaynelle Arabian, MD;  Location: [facility];  Service: Orthopedics;  Laterality: Right;  . US ECHOCARDIOGRAPHY  2011    Social History   Social History  . Marital status: Married    Spouse name: N/A  . Number of children: N/A  . Years of education: N/A   Occupational History  . Not on file.   Social History Main Topics  . Smoking status: Never Smoker  . Smokeless tobacco: Not on file  . Alcohol use No  . Drug use: No  . Sexual activity: Not on file   Other Topics Concern  . Not on file   Social History Narrative   Pt does not get regular exercise.   Daily Caffeine Use (12 oz Coke Zero per day)    Current Outpatient Prescriptions on File Prior to Visit  Medication Sig Dispense Refill  . Fiber CHEW Chew 2 tablets by mouth every morning.     . lansoprazole (PREVACID) 30 MG capsule Take 30 mg by mouth daily  as needed (acod reflux).     Marland Kitchen losartan (COZAAR) 25 MG tablet TAKE 1 TABLET (25 MG TOTAL) BY MOUTH DAILY. 90 tablet 3  . [DISCONTINUED] olmesartan (BENICAR) 20 MG tablet Take 20 mg by mouth daily.       No current facility-administered medications on file prior to visit.     No Known Allergies  Family History  Problem Relation Age of Onset  . Diabetes Mother   . Thyroid disease Mother     hypothyroidism  . Cancer Other     Pancreatic Cancer-Aunt    BP 122/80   Pulse 73   Ht 5\' 7"  (1.702 m)   Wt 160 lb (72.6 kg)   SpO2 97%   BMI 25.06 kg/m     Review of Systems Constitutional: Negative for fever.  HENT: Negative for hearing loss.   Eyes: Negative for visual disturbance.  Respiratory: Negative for shortness of  breath.   Cardiovascular: Negative for chest pain.  Gastrointestinal: Negative for anal bleeding.  Endocrine: Negative for cold intolerance.  Genitourinary: Negative for hematuria.  Musculoskeletal: Negative for back pain.  Skin: Negative for rash.  Allergic/Immunologic: Positive for environmental allergies.  Neurological: Negative for syncope and headaches.  Hematological: Does not bruise/bleed easily.  Psychiatric/Behavioral: Negative for dysphoric mood.     Objective:   Physical Exam VS: see vs page GEN: no distress HEAD: head: no deformity eyes: no periorbital swelling, no proptosis external nose and ears are normal mouth: no lesion seen NECK: supple, thyroid is not enlarged CHEST WALL: no deformity LUNGS:  Clear to auscultation BREASTS: sees gyn. ABD: abdomen is soft, nontender.  no hepatosplenomegaly.  not distended.  no hernia GENITALIA/RECTAL: sees gyn MUSCULOSKELETAL: muscle bulk and strength are grossly normal.  no obvious joint swelling.  gait is normal and steady EXTEMITIES: no deformity.  no ulcer on the feet.  feet are of normal color and temp.  no edema PULSES: dorsalis pedis intact bilat.  no carotid bruit NEURO:  cn 2-12 grossly intact.   readily moves all 4's.  sensation is intact to touch on the feet SKIN:  Normal texture and temperature.  No rash or suspicious lesion is visible.   NODES:  None palpable at the neck.  PSYCH: alert, well-oriented.  Does not appear anxious nor depressed.    i personally reviewed electrocardiogram tracing (today): Indication: wellness. Impression: normal    Assessment & Plan:  Wellness visit today, with problems stable, except as noted. Patient is advised the following:  Patient Instructions  Please consider these measures for your health:  minimize alcohol.  Do not use tobacco products.  Have a colonoscopy at least every 10 years from age 52.  Women should have an annual mammogram from age 78.  Keep firearms safely stored.   Always use seat belts.  have working smoke alarms in your home.  See an eye doctor and dentist regularly.  Never drive under the influence of alcohol or drugs (including prescription drugs).  Those with fair skin should take precautions against the sun, and should carefully examine their skin once per month, for any new or changed moles. please let me know what your wishes would be, if artificial life support measures should become necessary.  It is critically important to prevent falling down (keep floor areas well-lit, dry, and free of loose objects.  If you have a cane, walker, or wheelchair, you should use it, even for short trips around the house.  Wear flat-soled shoes.  Also, try not  to rush).   blood tests are requested for you today.  We'll let you know about the results.   Please return in 1 year.

## 2016-07-28 ENCOUNTER — Ambulatory Visit (INDEPENDENT_AMBULATORY_CARE_PROVIDER_SITE_OTHER): Payer: BLUE CROSS/BLUE SHIELD | Admitting: Endocrinology

## 2016-07-28 ENCOUNTER — Encounter: Payer: Self-pay | Admitting: Endocrinology

## 2016-07-28 VITALS — BP 122/80 | HR 73 | Ht 67.0 in | Wt 160.0 lb

## 2016-07-28 DIAGNOSIS — N951 Menopausal and female climacteric states: Secondary | ICD-10-CM | POA: Diagnosis not present

## 2016-07-28 DIAGNOSIS — R7302 Impaired glucose tolerance (oral): Secondary | ICD-10-CM | POA: Diagnosis not present

## 2016-07-28 DIAGNOSIS — Z0000 Encounter for general adult medical examination without abnormal findings: Secondary | ICD-10-CM | POA: Diagnosis not present

## 2016-07-28 DIAGNOSIS — Z23 Encounter for immunization: Secondary | ICD-10-CM | POA: Diagnosis not present

## 2016-07-28 LAB — CBC WITH DIFFERENTIAL/PLATELET
BASOS PCT: 0.8 % (ref 0.0–3.0)
Basophils Absolute: 0.1 10*3/uL (ref 0.0–0.1)
EOS PCT: 1 % (ref 0.0–5.0)
Eosinophils Absolute: 0.1 10*3/uL (ref 0.0–0.7)
HCT: 40.4 % (ref 36.0–46.0)
Hemoglobin: 13.6 g/dL (ref 12.0–15.0)
LYMPHS ABS: 3.3 10*3/uL (ref 0.7–4.0)
Lymphocytes Relative: 47.5 % — ABNORMAL HIGH (ref 12.0–46.0)
MCHC: 33.8 g/dL (ref 30.0–36.0)
MCV: 88.2 fl (ref 78.0–100.0)
MONO ABS: 0.4 10*3/uL (ref 0.1–1.0)
MONOS PCT: 5.1 % (ref 3.0–12.0)
NEUTROS ABS: 3.2 10*3/uL (ref 1.4–7.7)
NEUTROS PCT: 45.6 % (ref 43.0–77.0)
Platelets: 297 10*3/uL (ref 150.0–400.0)
RBC: 4.58 Mil/uL (ref 3.87–5.11)
RDW: 13.5 % (ref 11.5–15.5)
WBC: 7 10*3/uL (ref 4.0–10.5)

## 2016-07-28 LAB — BASIC METABOLIC PANEL
BUN: 12 mg/dL (ref 6–23)
CALCIUM: 10 mg/dL (ref 8.4–10.5)
CO2: 30 mEq/L (ref 19–32)
CREATININE: 0.62 mg/dL (ref 0.40–1.20)
Chloride: 103 mEq/L (ref 96–112)
GFR: 104.46 mL/min (ref >60.00)
Glucose, Bld: 102 mg/dL — ABNORMAL HIGH (ref 70–99)
Potassium: 4.5 mEq/L (ref 3.5–5.1)
Sodium: 139 mEq/L (ref 135–145)

## 2016-07-28 LAB — LDL CHOLESTEROL, DIRECT: Direct LDL: 91 mg/dL

## 2016-07-28 LAB — URINALYSIS, ROUTINE W REFLEX MICROSCOPIC
BILIRUBIN URINE: NEGATIVE
Hgb urine dipstick: NEGATIVE
KETONES UR: NEGATIVE
Leukocytes, UA: NEGATIVE
NITRITE: NEGATIVE
PH: 5.5 (ref 5.0–8.0)
SPECIFIC GRAVITY, URINE: 1.02 (ref 1.000–1.030)
TOTAL PROTEIN, URINE-UPE24: NEGATIVE
Urine Glucose: NEGATIVE
Urobilinogen, UA: 0.2 (ref 0.0–1.0)
WBC UA: NONE SEEN — AB (ref 0–?)

## 2016-07-28 LAB — HEMOGLOBIN A1C: HEMOGLOBIN A1C: 5.6 % (ref 4.6–6.5)

## 2016-07-28 LAB — LIPID PANEL
CHOL/HDL RATIO: 4
Cholesterol: 164 mg/dL (ref 0–200)
HDL: 43 mg/dL (ref >39.00)
NONHDL: 121.46
TRIGLYCERIDES: 206 mg/dL — AB (ref 0.0–149.0)
VLDL: 41.2 mg/dL — ABNORMAL HIGH (ref 0.0–40.0)

## 2016-07-28 LAB — HEPATIC FUNCTION PANEL
ALK PHOS: 90 U/L (ref 39–117)
ALT: 23 U/L (ref 0–35)
AST: 22 U/L (ref 0–37)
Albumin: 4.7 g/dL (ref 3.5–5.2)
BILIRUBIN DIRECT: 0.1 mg/dL (ref 0.0–0.3)
BILIRUBIN TOTAL: 0.5 mg/dL (ref 0.2–1.2)
Total Protein: 7.7 g/dL (ref 6.0–8.3)

## 2016-07-28 LAB — TSH: TSH: 2.06 u[IU]/mL (ref 0.35–4.50)

## 2016-07-28 LAB — HIV ANTIBODY (ROUTINE TESTING W REFLEX): HIV: NONREACTIVE

## 2016-07-28 NOTE — Patient Instructions (Signed)
Please consider these measures for your health:  minimize alcohol.  Do not use tobacco products.  Have a colonoscopy at least every 10 years from age 59.  Women should have an annual mammogram from age 40.  Keep firearms safely stored.  Always use seat belts.  have working smoke alarms in your home.  See an eye doctor and dentist regularly.  Never drive under the influence of alcohol or drugs (including prescription drugs).  Those with fair skin should take precautions against the sun, and should carefully examine their skin once per month, for any new or changed moles.  please let me know what your wishes would be, if artificial life support measures should become necessary.  It is critically important to prevent falling down (keep floor areas well-lit, dry, and free of loose objects.  If you have a cane, walker, or wheelchair, you should use it, even for short trips around the house.  Wear flat-soled shoes.  Also, try not to rush).    blood tests are requested for you today.  We'll let you know about the results.  Please return in 1 year.      

## 2016-07-28 NOTE — Progress Notes (Signed)
we discussed code status.  pt requests full code, but would not want to be started or maintained on artificial life-support measures if there was not a reasonable chance of recovery 

## 2016-07-29 ENCOUNTER — Telehealth: Payer: Self-pay | Admitting: Endocrinology

## 2016-07-29 NOTE — Telephone Encounter (Signed)
I contacted the patent's husband who is listed on the DPR and advised resutls from 07/28/2016 were normal. He will advise the patient and have her call back if she has any further questions.

## 2016-07-29 NOTE — Telephone Encounter (Signed)
Patient is calling for the results of her labs °

## 2016-07-29 NOTE — Telephone Encounter (Signed)
Patient is calling back about her results. 

## 2016-08-05 ENCOUNTER — Other Ambulatory Visit: Payer: Self-pay | Admitting: Endocrinology

## 2016-08-11 ENCOUNTER — Other Ambulatory Visit: Payer: BLUE CROSS/BLUE SHIELD

## 2016-09-23 ENCOUNTER — Ambulatory Visit (INDEPENDENT_AMBULATORY_CARE_PROVIDER_SITE_OTHER): Payer: BLUE CROSS/BLUE SHIELD | Admitting: Endocrinology

## 2016-09-23 DIAGNOSIS — R221 Localized swelling, mass and lump, neck: Secondary | ICD-10-CM

## 2016-09-23 MED ORDER — AMOXICILLIN 500 MG PO CAPS: 500.0000 mg | ORAL_CAPSULE | Freq: Three times a day (TID) | ORAL | 0 refills | Status: DC

## 2016-09-23 NOTE — Progress Notes (Signed)
Subjective:    Patient ID: Laura [name], female    DOB: Jan 05, 1956, 61 y.o.   MRN: [id number]  HPI Pt states few days of slight swelling under the left mandibular area, but no assoc pain.  No recent dental problems.   Past Medical History:  Diagnosis Date  . ABNORMAL ELECTROCARDIOGRAM 08/05/2010   02-19-15 EKG shows NSR. Pt voices no issues with heart related problems.  . ACNE, MILD 02/17/2008   not a problem so much 06-18-15  . ALLERGIC RHINITIS 01/25/2008  . ASTHMA 01/25/2008  . 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 CONSTIPATION 02/21/2008   uses fiber to control  . ESOPHAGEAL STRICTURE 02/17/2008   dilation done - no problems since.  Marland Kitchen GERD 01/25/2008   uses Prevacid as needed  . HYPERGLYCEMIA   . HYPERTENSION 08/05/2010  . Impaired glucose tolerance 01/11/2011  . METATARSALGIA 11/08/2007   work related- no recent issues"wearing steel toe shoes at the time"  . PONV (postoperative nausea and vomiting)   . RECTAL BLEEDING 01/04/2008   06-18-15 no problems now.  . Tendonitis    right wrist-has soft splint in place.  . Tibialis tendinitis 05/13/2010  . 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  Marland Kitchen VENTRICULAR HYPERTROPHY, LEFT 09/23/2010  . Wears glasses     Past Surgical History:  Procedure Laterality Date  . ABDOMINAL HYSTERECTOMY  2002   TAH-LTSO-LOA  . ARTHROSCOPIC REPAIR ACL Right 10/1998   right knee   . BREAST SURGERY Left    lumpectomy-benign 2015  . CARDIOVASCULAR STRESS TEST  2011  . CARPAL TUNNEL RELEASE  2014   left  . CHOLECYSTECTOMY  1985   open  . COLONOSCOPY W/ POLYPECTOMY    . DILATION AND CURETTAGE OF UTERUS    . ELBOW ARTHROPLASTY  2014   left  . ESOPHAGOGASTRODUODENOSCOPY  04/01/1998  . KNEE ARTHROSCOPY Bilateral    meniscus tear-left.  Marland Kitchen NASAL SINUS SURGERY     s/p  . ORIF ELBOW FRACTURE  12/23/2011   Procedure: OPEN REDUCTION INTERNAL FIXATION (ORIF)  ELBOW/OLECRANON FRACTURE;  Surgeon: Linna Hoff, MD;  Location: [facility];  Service: Orthopedics;  Laterality: Left;  LEFT ELBOW ORIF OF PROXIMAL OLECRANON AND RADIAL HEAD, POSSIBLE ARTHROPLASTY  . OVARIAN CYST REMOVAL    . TOTAL KNEE ARTHROPLASTY Right 06/24/2015   Procedure: RIGHT TOTAL KNEE ARTHROPLASTY;  Surgeon: Gaynelle Arabian, MD;  Location: [facility];  Service: Orthopedics;  Laterality: Right;  . US ECHOCARDIOGRAPHY  2011    Social History   Social History  . Marital status: Married    Spouse name: N/A  . Number of children: N/A  . Years of education: N/A   Occupational History  . Not on file.   Social History Main Topics  . Smoking status: Never Smoker  . Smokeless tobacco: Not on file  . Alcohol use No  . Drug use: No  . Sexual activity: Not on file   Other Topics Concern  . Not on file   Social History Narrative   Pt does not get regular exercise.   Daily Caffeine Use (12 oz Coke Zero per day)    Current Outpatient Prescriptions on File Prior to Visit  Medication Sig Dispense Refill  . aspirin EC 81 MG tablet Take 81 mg by mouth daily.    . Calcium Carbonate-Vitamin D (CALTRATE 600+D PO) Take 2 tablets by mouth.    Marland Kitchen  Fiber CHEW Chew 2 tablets by mouth every morning.     . lansoprazole (PREVACID) 30 MG capsule Take 30 mg by mouth daily as needed (acod reflux).     Marland Kitchen losartan (COZAAR) 25 MG tablet TAKE 1 TABLET (25 MG TOTAL) BY MOUTH DAILY. 90 tablet 3  . Omega-3 Fatty Acids (FISH OIL PO) Take by mouth.    . vitamin E 400 UNIT capsule Take 400 Units by mouth daily.    . [DISCONTINUED] olmesartan (BENICAR) 20 MG tablet Take 20 mg by mouth daily.       No current facility-administered medications on file prior to visit.     No Known Allergies  Family History  Problem Relation Age of Onset  . Diabetes Mother   . Thyroid disease Mother     hypothyroidism  . Cancer Other     Pancreatic Cancer-Aunt    BP 122/64   Pulse 78   Ht 5\' 7"  (1.702 m)   Wt 166 lb  (75.3 kg)   SpO2 97%   BMI 26.00 kg/m    Review of Systems Denies fever, but she has nasal and ear congestion.      Objective:   Physical Exam VITAL SIGNS:  See vs page GENERAL: no distress head: no deformity  eyes: no periorbital swelling, no proptosis  external nose and ears are normal.   mouth: no lesion seen.   Both eac's and tm's are slightly red.   NECK: There is no palpable thyroid enlargement.  No thyroid nodule is palpable.  There is slight swelling (2 cm), at the left submandibular area.         Assessment & Plan:  Neck swelling, new, uncertain etiology.  Patient is advised the following: Patient Instructions  I have sent a prescription to your pharmacy, for an antibiotic pill. The swollen area may take weeks to go down, so please call in a few weeks if it persists.

## 2016-09-23 NOTE — Patient Instructions (Signed)
I have sent a prescription to your pharmacy, for an antibiotic pill. The swollen area may take weeks to go down, so please call in a few weeks if it persists.

## 2016-09-25 DIAGNOSIS — R221 Localized swelling, mass and lump, neck: Secondary | ICD-10-CM | POA: Insufficient documentation

## 2016-12-03 ENCOUNTER — Telehealth: Payer: Self-pay | Admitting: Endocrinology

## 2016-12-03 NOTE — Telephone Encounter (Signed)
I contacted the patient and advised of MDs response. Patient scheduled for 12/07/2016.

## 2016-12-03 NOTE — Telephone Encounter (Signed)
See message and please advise, Thanks!  

## 2016-12-03 NOTE — Telephone Encounter (Signed)
Pt called in, she said that Tuesday she was doing things around her house and she feels like she pulled something in her back.  She has been taking tylenol since them, but she said that it is still not any better. She would like to know if a muscle relaxer can be called in for her to help it relax.  Please advise, thank you!

## 2016-12-03 NOTE — Telephone Encounter (Signed)
For your safety, please come in soon for ov

## 2016-12-07 ENCOUNTER — Ambulatory Visit: Payer: BLUE CROSS/BLUE SHIELD | Admitting: Endocrinology

## 2017-02-27 ENCOUNTER — Other Ambulatory Visit (HOSPITAL_COMMUNITY)
Admission: RE | Admit: 2017-02-27 | Discharge: 2017-02-27 | Disposition: A | Discharge status: Home / Self Care | Payer: BLUE CROSS/BLUE SHIELD | Source: Other Acute Inpatient Hospital | Attending: Family Medicine | Admitting: Family Medicine

## 2017-02-27 ENCOUNTER — Encounter: Payer: Self-pay | Admitting: Family Medicine

## 2017-02-27 ENCOUNTER — Ambulatory Visit (INDEPENDENT_AMBULATORY_CARE_PROVIDER_SITE_OTHER): Payer: BLUE CROSS/BLUE SHIELD | Admitting: Family Medicine

## 2017-02-27 VITALS — BP 120/80 | HR 64 | Temp 98.1°F | Ht 67.0 in | Wt 171.0 lb

## 2017-02-27 DIAGNOSIS — R300 Dysuria: Secondary | ICD-10-CM | POA: Insufficient documentation

## 2017-02-27 DIAGNOSIS — R350 Frequency of micturition: Secondary | ICD-10-CM

## 2017-02-27 LAB — POCT URINALYSIS DIPSTICK
BILIRUBIN UA: NEGATIVE
Glucose, UA: NEGATIVE
KETONES UA: NEGATIVE
Leukocytes, UA: NEGATIVE
NITRITE UA: NEGATIVE
PH UA: 6 (ref 5.0–8.0)
Protein, UA: NEGATIVE
RBC UA: NEGATIVE
SPEC GRAV UA: 1.015 (ref 1.010–1.025)
Urobilinogen, UA: 0.2 E.U./dL

## 2017-02-27 NOTE — Patient Instructions (Addendum)
Initial urine test normal Possible false negative and actually has UTI. Will get culture but given mild improvement we will hold off on antibiotic. Can use AZO over the counter for the discomfort and continue increased hydration  follow up if new or worsening symptoms or failure to improve.  Should have culture results by Tuesday or Wednesday.

## 2017-02-27 NOTE — Progress Notes (Addendum)
Subjective:  Laura [name] is a 61 y.o. year old very pleasant female patient who presents for/with See problem oriented charting ROS- see ROS noted below  Past Medical History-  Patient Active Problem List   Diagnosis Date Noted  . Neck swelling 09/25/2016  . Fever 10/16/2015  . OA (osteoarthritis) of knee 06/24/2015  . Preop cardiovascular exam 02/19/2015  . UTI (urinary tract infection) 12/16/2013  . Osteoporosis 10/30/2013  . Menopausal state 05/18/2013  . Left otitis media 02/19/2012  . Routine general medical examination at a health care facility 12/02/2011  . Impaired glucose tolerance 01/11/2011  . Sternal fracture 12/29/2010  . Preventative health care 12/26/2010  . VENTRICULAR HYPERTROPHY, LEFT 09/23/2010  . Essential hypertension 08/05/2010  . ABNORMAL ELECTROCARDIOGRAM 08/05/2010  . Tibialis tendinitis 05/13/2010  . CONSTIPATION 02/21/2008  . ESOPHAGEAL STRICTURE 02/17/2008  . ACNE, MILD 02/17/2008  . ALLERGIC RHINITIS 01/25/2008  . Asthma 01/25/2008  . GERD 01/25/2008  . HYPERCHOLESTEROLEMIA 01/19/2008  . Unspecified disorder of liver 01/19/2008  . RECTAL BLEEDING 01/04/2008  . METATARSALGIA 11/08/2007  . 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 aspirin EC 81 MG tablet Take 81 mg by mouth daily.    . Calcium Carbonate-Vitamin D (CALTRATE 600+D PO) Take 2 tablets by mouth.    . Fiber CHEW Chew 2 tablets by mouth every morning.     Marland Kitchen losartan (COZAAR) 25 MG tablet TAKE 1 TABLET (25 MG TOTAL) BY MOUTH DAILY. 90 tablet 3  . Omega-3 Fatty Acids (FISH OIL PO) Take by mouth.    . vitamin E 400 UNIT capsule Take 400 Units by mouth daily.     No current facility-administered medications for this visit.     Objective: BP 120/80 (BP Location: Left Arm, Patient Position: Sitting, Cuff Size: Normal)   Pulse 64   Temp 98.1 F (36.7 C) (Oral)   Ht 5\' 7"   (1.702 m)   Wt 171 lb (77.6 kg)   SpO2 99%   BMI 26.78 kg/m  Gen: NAD, resting comfortably, well appearing CV: RRR no murmurs rubs or gallops Lungs: CTAB no crackles, wheeze, rhonchi Abdomen: soft/nontender- except very mild tenderness suprapubically/nondistended/normal bowel sounds. No rebound or guarding. Mild cva tenderness- states her baseline.  Ext: no edema Skin: warm, dry Neuro: normal gait  Assessment/Plan:  Concern for UTI with dysuria and polyuria S: Patients symptoms started about 5 days ago.  Complains of dysuria: yes, polyuria: yes; nocturia: rarely; urgency: none. Mild suprapubic pain now improving. Slight increase in urine odor.  Symptoms are getting slightly better- drinking a lot of water.  ROS- no fever, chills, nausea, vomiting, flank pain. No blood in urine. No vaginal discharge.  Some suprapubic pain with peeing. No significant increase in CVA tenderness- has some chronic low back pain  A/P: UA unremarkable. Possible false negative and actually hasUTI. Will get culture but given mild improvement we will hold off on antibiotic. Can use AZO over the counter for the discomfort and continue increased hydration Patient to follow up if new or worsening symptoms or failure to improve.   Also married over 25 years and states no chance of infidelity so doubt UTI.    Orders Placed This Encounter  Procedures  . POCT urinalysis dipstick  Urine culture will be ordered and taken to hospital by Anselmo Pickler, LPN  Return precautions advised.  Garret Reddish, MD

## 2017-02-27 NOTE — Addendum Note (Signed)
Addended by: Marian Sorrow on: 02/27/2017 10:30 AM   Modules accepted: Orders

## 2017-03-01 LAB — URINE CULTURE: Culture: >=100000 — AB

## 2017-03-02 ENCOUNTER — Other Ambulatory Visit: Payer: Self-pay

## 2017-03-02 MED ORDER — CEPHALEXIN 500 MG PO CAPS: 500.0000 mg | ORAL_CAPSULE | Freq: Three times a day (TID) | ORAL | 0 refills | Status: DC

## 2017-04-29 ENCOUNTER — Other Ambulatory Visit: Payer: Self-pay | Admitting: Obstetrics and Gynecology

## 2017-04-29 DIAGNOSIS — Z1231 Encounter for screening mammogram for malignant neoplasm of breast: Secondary | ICD-10-CM

## 2017-05-24 ENCOUNTER — Ambulatory Visit
Admission: RE | Admit: 2017-05-24 | Discharge: 2017-05-24 | Disposition: A | Discharge status: Home / Self Care | Payer: BLUE CROSS/BLUE SHIELD | Source: Ambulatory Visit | Attending: Obstetrics and Gynecology | Admitting: Obstetrics and Gynecology

## 2017-05-24 DIAGNOSIS — Z1231 Encounter for screening mammogram for malignant neoplasm of breast: Secondary | ICD-10-CM

## 2017-07-28 ENCOUNTER — Ambulatory Visit: Payer: BLUE CROSS/BLUE SHIELD | Admitting: Endocrinology

## 2017-07-28 ENCOUNTER — Encounter: Payer: Self-pay | Admitting: Endocrinology

## 2017-07-28 VITALS — BP 128/84 | HR 76 | Wt 175.8 lb

## 2017-07-28 DIAGNOSIS — M810 Age-related osteoporosis without current pathological fracture: Secondary | ICD-10-CM | POA: Diagnosis not present

## 2017-07-28 DIAGNOSIS — Z0000 Encounter for general adult medical examination without abnormal findings: Secondary | ICD-10-CM

## 2017-07-28 LAB — BASIC METABOLIC PANEL
BUN: 11 mg/dL (ref 6–23)
CALCIUM: 9.8 mg/dL (ref 8.4–10.5)
CHLORIDE: 105 meq/L (ref 96–112)
CO2: 29 meq/L (ref 19–32)
CREATININE: 0.63 mg/dL (ref 0.40–1.20)
GFR: 102.2 mL/min (ref >60.00)
Glucose, Bld: 97 mg/dL (ref 70–99)
Potassium: 4.1 mEq/L (ref 3.5–5.1)
SODIUM: 141 meq/L (ref 135–145)

## 2017-07-28 LAB — LIPID PANEL
CHOLESTEROL: 195 mg/dL (ref 0–200)
HDL: 40.4 mg/dL (ref >39.00)
NONHDL: 155.09
TRIGLYCERIDES: 352 mg/dL — AB (ref 0.0–149.0)
Total CHOL/HDL Ratio: 5
VLDL: 70.4 mg/dL — ABNORMAL HIGH (ref 0.0–40.0)

## 2017-07-28 LAB — URINALYSIS, ROUTINE W REFLEX MICROSCOPIC
BILIRUBIN URINE: NEGATIVE
HGB URINE DIPSTICK: NEGATIVE
Ketones, ur: NEGATIVE
NITRITE: NEGATIVE
RBC / HPF: NONE SEEN (ref 0–?)
Specific Gravity, Urine: 1.025 (ref 1.000–1.030)
TOTAL PROTEIN, URINE-UPE24: NEGATIVE
Urine Glucose: NEGATIVE
Urobilinogen, UA: 0.2 (ref 0.0–1.0)
pH: 5.5 (ref 5.0–8.0)

## 2017-07-28 LAB — CBC WITH DIFFERENTIAL/PLATELET
BASOS ABS: 0 10*3/uL (ref 0.0–0.1)
Basophils Relative: 0.5 % (ref 0.0–3.0)
EOS PCT: 1.1 % (ref 0.0–5.0)
Eosinophils Absolute: 0.1 10*3/uL (ref 0.0–0.7)
HCT: 42.4 % (ref 36.0–46.0)
Hemoglobin: 13.7 g/dL (ref 12.0–15.0)
LYMPHS PCT: 49.1 % — AB (ref 12.0–46.0)
Lymphs Abs: 3.2 10*3/uL (ref 0.7–4.0)
MCHC: 32.5 g/dL (ref 30.0–36.0)
MCV: 91 fl (ref 78.0–100.0)
MONOS PCT: 5.1 % (ref 3.0–12.0)
Monocytes Absolute: 0.3 10*3/uL (ref 0.1–1.0)
NEUTROS PCT: 44.2 % (ref 43.0–77.0)
Neutro Abs: 2.9 10*3/uL (ref 1.4–7.7)
PLATELETS: 296 10*3/uL (ref 150.0–400.0)
RBC: 4.66 Mil/uL (ref 3.87–5.11)
RDW: 13.6 % (ref 11.5–15.5)
WBC: 6.5 10*3/uL (ref 4.0–10.5)

## 2017-07-28 LAB — HEPATIC FUNCTION PANEL
ALBUMIN: 4.5 g/dL (ref 3.5–5.2)
ALT: 25 U/L (ref 0–35)
AST: 26 U/L (ref 0–37)
Alkaline Phosphatase: 87 U/L (ref 39–117)
Bilirubin, Direct: 0.1 mg/dL (ref 0.0–0.3)
Total Bilirubin: 0.5 mg/dL (ref 0.2–1.2)
Total Protein: 7.6 g/dL (ref 6.0–8.3)

## 2017-07-28 LAB — VITAMIN D 25 HYDROXY (VIT D DEFICIENCY, FRACTURES): VITD: 31.06 ng/mL (ref 30.00–100.00)

## 2017-07-28 LAB — TSH: TSH: 2.01 u[IU]/mL (ref 0.35–4.50)

## 2017-07-28 LAB — LDL CHOLESTEROL, DIRECT: Direct LDL: 102 mg/dL

## 2017-07-28 NOTE — Progress Notes (Signed)
Subjective:    Patient ID: Laura [name], female    DOB: August 27, 1956, 61 y.o.   MRN: [id number]  HPI Pt is here for regular wellness examination, and is feeling pretty well in general, and says chronic med probs are stable, except as noted below Past Medical History:  Diagnosis Date  . ABNORMAL ELECTROCARDIOGRAM 08/05/2010   02-19-15 EKG shows NSR. Pt voices no issues with heart related problems.  . ACNE, MILD 02/17/2008   not a problem so much 06-18-15  . ALLERGIC RHINITIS 01/25/2008  . ASTHMA 01/25/2008  . 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 CONSTIPATION 02/21/2008   uses fiber to control  . ESOPHAGEAL STRICTURE 02/17/2008   dilation done - no problems since.  Marland Kitchen GERD 01/25/2008   uses Prevacid as needed  . HYPERGLYCEMIA   . HYPERTENSION 08/05/2010  . Impaired glucose tolerance 01/11/2011  . METATARSALGIA 11/08/2007   work related- no recent issues"wearing steel toe shoes at the time"  . PONV (postoperative nausea and vomiting)   . RECTAL BLEEDING 01/04/2008   06-18-15 no problems now.  . Tendonitis    right wrist-has soft splint in place.  . Tibialis tendinitis 05/13/2010  . 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  Marland Kitchen VENTRICULAR HYPERTROPHY, LEFT 09/23/2010  . Wears glasses     Past Surgical History:  Procedure Laterality Date  . ABDOMINAL HYSTERECTOMY  2002   TAH-LTSO-LOA  . ARTHROSCOPIC REPAIR ACL Right 10/1998   right knee   . BREAST SURGERY Left    lumpectomy-benign 2015  . CARDIOVASCULAR STRESS TEST  2011  . CARPAL TUNNEL RELEASE  2014   left  . CHOLECYSTECTOMY  1985   open  . COLONOSCOPY W/ POLYPECTOMY    . DILATION AND CURETTAGE OF UTERUS    . ELBOW ARTHROPLASTY  2014   left  . ESOPHAGOGASTRODUODENOSCOPY  04/01/1998  . KNEE ARTHROSCOPY Bilateral    meniscus tear-left.  Marland Kitchen NASAL SINUS SURGERY     s/p  . OPEN REDUCTION INTERNAL FIXATION (ORIF) ELBOW/OLECRANON  FRACTURE Left 12/23/2011   Performed by Linna Hoff, MD at [facility]    . RADIOACTIVE SEED GUIDED EXCISIONAL BREAST BIOPSY Left 05/15/2014   Performed by Rolm Bookbinder, [facility] at [facility]  . RIGHT TOTAL KNEE ARTHROPLASTY Right 06/24/2015   Performed by Gaynelle Arabian, MD at [facility]  . US ECHOCARDIOGRAPHY  2011    Social History   Socioeconomic History  . Marital status: Married    Spouse name: Not on file  . Number of children: Not on file  . Years of education: Not on file  . Highest education level: Not on file  Social Needs  . Financial resource strain: Not on file  . Food insecurity - worry: Not on file  . Food insecurity - inability: Not on file  . Transportation needs - medical: Not on file  . Transportation needs - non-medical: Not on file  Occupational History  . Not on file  Tobacco Use  . Smoking status: Never Smoker  . Smokeless tobacco: Never Used  Substance and Sexual Activity  . Alcohol use: No  . Drug use: No  . Sexual activity: Not on file  Other Topics Concern  . Not on file  Social History Narrative   Pt does not get regular exercise.   Daily Caffeine Use (12 oz Coke  Zero per day)    Current Outpatient Medications on File Prior to Visit  Medication Sig Dispense Refill  . aspirin EC 81 MG tablet Take 81 mg by mouth daily.    . Calcium Carbonate-Vitamin D (CALTRATE 600+D PO) Take 2 tablets by mouth.    . Fiber CHEW Chew 2 tablets by mouth every morning.     Marland Kitchen losartan (COZAAR) 25 MG tablet TAKE 1 TABLET (25 MG TOTAL) BY MOUTH DAILY. 90 tablet 3  . Omega-3 Fatty Acids (FISH OIL PO) Take by mouth.    . vitamin E 400 UNIT capsule Take 400 Units by mouth daily.    . [DISCONTINUED] olmesartan (BENICAR) 20 MG tablet Take 20 mg by mouth daily.       No current facility-administered medications on file prior to visit.     No Known Allergies  Family History  Problem Relation Age of Onset  . Diabetes Mother   .  Thyroid disease Mother        hypothyroidism  . Cancer Other        Pancreatic Cancer-Aunt    BP 128/84 (BP Location: Left Arm, Patient Position: Sitting)   Pulse 76   Wt 175 lb 12.8 oz (79.7 kg)   SpO2 96%   BMI 27.53 kg/m     Review of Systems Denies fever, fatigue, visual loss, hearing loss, chest pain, sob, depression, cold intolerance, BRBPR, hematuria, syncope, numbness, easy bruising, and rash. She sees Dr Vertell Limber for low-back pain.  She sees Dr Orvil Feil for allergic sxs.     Objective:   Physical Exam VS: see vs page GEN: no distress HEAD: head: no deformity eyes: no periorbital swelling, no proptosis external nose and ears are normal mouth: no lesion seen NECK: supple, thyroid is not enlarged CHEST WALL: no deformity LUNGS:  Clear to auscultation.  BREASTS: sees gyn.  CV: reg rate and rhythm, no murmur ABD: abdomen is soft, nontender.  no hepatosplenomegaly.  not distended.  no hernia.   GENITALIA/RECTAL: sees gyn MUSCULOSKELETAL: muscle bulk and strength are grossly normal.  no obvious joint swelling.  gait is normal and steady EXTEMITIES: no deformity.  no ulcer on the feet.  feet are of normal color and temp.  no edema PULSES: dorsalis pedis intact bilat.  no carotid bruit NEURO:  cn 2-12 grossly intact.   readily moves all 4's.  sensation is intact to touch on the feet SKIN:  Normal texture and temperature.  No rash or suspicious lesion is visible.   NODES:  None palpable at the neck PSYCH: alert, well-oriented.  Does not appear anxious nor depressed.   I personally reviewed electrocardiogram tracing (today): Indication: wellness Impression: NSR.  No MI.  No hypertrophy.  normal Compared to 2017: no significant change     Assessment & Plan:  Wellness visit today, with problems stable, except as noted.  Patient Instructions  Please consider these measures for your health:  minimize alcohol.  Do not use tobacco products.  Have a colonoscopy at least every  10 years from age 28.  Women should have an annual mammogram from age 37.  Keep firearms safely stored.  Always use seat belts.  have working smoke alarms in your home.  See an eye doctor and dentist regularly.  Never drive under the influence of alcohol or drugs (including prescription drugs).  Those with fair skin should take precautions against the sun, and should carefully examine their skin once per month, for any new or changed moles. blood  tests are requested for you today.  We'll let you know about the results.   Please come back for a follow-up appointment in 1 year.

## 2017-07-28 NOTE — Patient Instructions (Signed)
Please consider these measures for your health:  minimize alcohol.  Do not use tobacco products.  Have a colonoscopy at least every 10 years from age 60.  Women should have an annual mammogram from age 27.  Keep firearms safely stored.  Always use seat belts.  have working smoke alarms in your home.  See an eye doctor and dentist regularly.  Never drive under the influence of alcohol or drugs (including prescription drugs).  Those with fair skin should take precautions against the sun, and should carefully examine their skin once per month, for any new or changed moles. blood tests are requested for you today.  We'll let you know about the results.   Please come back for a follow-up appointment in 1 year.

## 2017-07-29 ENCOUNTER — Telehealth: Payer: Self-pay | Admitting: Endocrinology

## 2017-07-29 NOTE — Telephone Encounter (Signed)
Patient notified of normal results.

## 2017-07-29 NOTE — Telephone Encounter (Signed)
Patient stated that her lab results haven't been put on my chart yet. Please advise

## 2017-07-30 LAB — PTH, INTACT AND CALCIUM
CALCIUM: 9.9 mg/dL (ref 8.6–10.4)
PTH: 29 pg/mL (ref 14–64)

## 2017-08-06 ENCOUNTER — Other Ambulatory Visit: Payer: Self-pay | Admitting: Endocrinology

## 2017-08-10 ENCOUNTER — Encounter: Payer: Self-pay | Admitting: Endocrinology

## 2017-08-10 ENCOUNTER — Ambulatory Visit (INDEPENDENT_AMBULATORY_CARE_PROVIDER_SITE_OTHER): Payer: BLUE CROSS/BLUE SHIELD | Admitting: Endocrinology

## 2017-08-10 VITALS — BP 111/69 | HR 82 | Wt 181.2 lb

## 2017-08-10 DIAGNOSIS — J209 Acute bronchitis, unspecified: Secondary | ICD-10-CM | POA: Diagnosis not present

## 2017-08-10 MED ORDER — CEFUROXIME AXETIL 250 MG PO TABS: 250.0000 mg | ORAL_TABLET | Freq: Two times a day (BID) | ORAL | 0 refills | Status: AC

## 2017-08-10 MED ORDER — PROMETHAZINE-CODEINE 6.25-10 MG/5ML PO SYRP: 5.0000 mL | ORAL_SOLUTION | ORAL | 0 refills | Status: DC | PRN

## 2017-08-10 NOTE — Progress Notes (Signed)
Subjective:    Patient ID: Laura [name], female    DOB: 11-25-1955, 61 y.o.   MRN: [id number]  HPI Pt states few days of moderate pain at both ears, and assoc cough.  Past Medical History:  Diagnosis Date  . ABNORMAL ELECTROCARDIOGRAM 08/05/2010   02-19-15 EKG shows NSR. Pt voices no issues with heart related problems.  . ACNE, MILD 02/17/2008   not a problem so much 06-18-15  . ALLERGIC RHINITIS 01/25/2008  . ASTHMA 01/25/2008  . 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 CONSTIPATION 02/21/2008   uses fiber to control  . ESOPHAGEAL STRICTURE 02/17/2008   dilation done - no problems since.  Marland Kitchen GERD 01/25/2008   uses Prevacid as needed  . HYPERGLYCEMIA   . HYPERTENSION 08/05/2010  . Impaired glucose tolerance 01/11/2011  . METATARSALGIA 11/08/2007   work related- no recent issues"wearing steel toe shoes at the time"  . PONV (postoperative nausea and vomiting)   . RECTAL BLEEDING 01/04/2008   06-18-15 no problems now.  . Tendonitis    right wrist-has soft splint in place.  . Tibialis tendinitis 05/13/2010  . 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  Marland Kitchen VENTRICULAR HYPERTROPHY, LEFT 09/23/2010  . Wears glasses     Past Surgical History:  Procedure Laterality Date  . ABDOMINAL HYSTERECTOMY  2002   TAH-LTSO-LOA  . ARTHROSCOPIC REPAIR ACL Right 10/1998   right knee   . BREAST SURGERY Left    lumpectomy-benign 2015  . CARDIOVASCULAR STRESS TEST  2011  . CARPAL TUNNEL RELEASE  2014   left  . CHOLECYSTECTOMY  1985   open  . COLONOSCOPY W/ POLYPECTOMY    . DILATION AND CURETTAGE OF UTERUS    . ELBOW ARTHROPLASTY  2014   left  . ESOPHAGOGASTRODUODENOSCOPY  04/01/1998  . KNEE ARTHROSCOPY Bilateral    meniscus tear-left.  Marland Kitchen NASAL SINUS SURGERY     s/p  . ORIF ELBOW FRACTURE  12/23/2011   Procedure: OPEN REDUCTION INTERNAL FIXATION (ORIF) ELBOW/OLECRANON FRACTURE;  Surgeon: Linna Hoff, MD;  Location: [facility];  Service: Orthopedics;  Laterality: Left;  LEFT ELBOW ORIF OF PROXIMAL OLECRANON AND RADIAL HEAD, POSSIBLE ARTHROPLASTY  . OVARIAN CYST REMOVAL    . TOTAL KNEE ARTHROPLASTY Right 06/24/2015   Procedure: RIGHT TOTAL KNEE ARTHROPLASTY;  Surgeon: Gaynelle Arabian, MD;  Location: [facility];  Service: Orthopedics;  Laterality: Right;  . US ECHOCARDIOGRAPHY  2011    Social History   Socioeconomic History  . Marital status: Married    Spouse name: Not on file  . Number of children: Not on file  . Years of education: Not on file  . Highest education level: Not on file  Social Needs  . Financial resource strain: Not on file  . Food insecurity - worry: Not on file  . Food insecurity - inability: Not on file  . Transportation needs - medical: Not on file  . Transportation needs - non-medical: Not on file  Occupational History  . Not on file  Tobacco Use  . Smoking status: Never Smoker  . Smokeless tobacco: Never Used  Substance and Sexual Activity  . Alcohol use: No  . Drug use: No  . Sexual activity: Not on file  Other Topics Concern  . Not on file  Social History Narrative   Pt does not get regular exercise.   Daily Caffeine Use (12 oz  Coke Zero per day)    Current Outpatient Medications on File Prior to Visit  Medication Sig Dispense Refill  . aspirin EC 81 MG tablet Take 81 mg by mouth daily.    . Calcium Carbonate-Vitamin D (CALTRATE 600+D PO) Take 2 tablets by mouth.    . Fiber CHEW Chew 2 tablets by mouth every morning.     Marland Kitchen losartan (COZAAR) 25 MG tablet TAKE 1 TABLET (25 MG TOTAL) BY MOUTH DAILY. 90 tablet 3  . Omega-3 Fatty Acids (FISH OIL PO) Take by mouth.    . vitamin E 400 UNIT capsule Take 400 Units by mouth daily.    . [DISCONTINUED] olmesartan (BENICAR) 20 MG tablet Take 20 mg by mouth daily.       No current facility-administered medications on file prior to visit.     No Known Allergies  Family History  Problem Relation Age of  Onset  . Diabetes Mother   . Thyroid disease Mother        hypothyroidism  . Cancer Other        Pancreatic Cancer-Aunt    BP 111/69 (BP Location: Left Arm, Patient Position: Sitting, Cuff Size: Normal)   Pulse 82   Wt 181 lb 3.2 oz (82.2 kg)   SpO2 98%   BMI 28.38 kg/m    Review of Systems Denies sob, but she has sore throat.     Objective:   Physical Exam VITAL SIGNS:  See vs page GENERAL: no distress head: no deformity  eyes: no periorbital swelling, no proptosis  external nose and ears are normal  mouth: no lesion seen Both eac's and tm's are normal LUNGS:  Clear to auscultation.        Assessment & Plan:  Acute bronchitis, new  Patient Instructions  I have sent a prescription to your pharmacy, for an antibiotic pill.  Here is a prescription for cough syrup.  allegra (non-prescription) will help your congestion.   I hope you feel better soon.  If you don't feel better by next week, please call back.  Please call sooner if you get worse.

## 2017-08-10 NOTE — Patient Instructions (Addendum)
I have sent a prescription to your pharmacy, for an antibiotic pill.  Here is a prescription for cough syrup.  allegra (non-prescription) will help your congestion.   I hope you feel better soon.  If you don't feel better by next week, please call back.  Please call sooner if you get worse.

## 2017-09-14 HISTORY — PX: BACK SURGERY: SHX140

## 2018-02-01 ENCOUNTER — Encounter: Payer: Self-pay | Admitting: Gastroenterology

## 2018-03-25 ENCOUNTER — Encounter: Payer: Self-pay | Admitting: Gastroenterology

## 2018-04-04 ENCOUNTER — Other Ambulatory Visit (HOSPITAL_COMMUNITY): Payer: Self-pay | Admitting: Neurosurgery

## 2018-04-04 DIAGNOSIS — M5416 Radiculopathy, lumbar region: Secondary | ICD-10-CM

## 2018-04-12 ENCOUNTER — Ambulatory Visit (HOSPITAL_COMMUNITY)
Admission: RE | Admit: 2018-04-12 | Discharge: 2018-04-12 | Disposition: A | Discharge status: Home / Self Care | Payer: BLUE CROSS/BLUE SHIELD | Source: Ambulatory Visit | Attending: Neurosurgery | Admitting: Neurosurgery

## 2018-04-12 DIAGNOSIS — M48061 Spinal stenosis, lumbar region without neurogenic claudication: Secondary | ICD-10-CM | POA: Insufficient documentation

## 2018-04-12 DIAGNOSIS — M5416 Radiculopathy, lumbar region: Secondary | ICD-10-CM | POA: Diagnosis present

## 2018-04-12 LAB — POCT I-STAT CREATININE: Creatinine, Ser: 0.4 mg/dL — ABNORMAL LOW (ref 0.44–1.00)

## 2018-04-12 MED ORDER — GADOBENATE DIMEGLUMINE 529 MG/ML IV SOLN
17.0000 mL | Freq: Once | INTRAVENOUS | Status: AC | PRN
  Administered 2018-04-12: 17 mL via INTRAVENOUS

## 2018-04-27 ENCOUNTER — Ambulatory Visit (AMBULATORY_SURGERY_CENTER): Payer: Self-pay

## 2018-04-27 VITALS — Ht 67.0 in | Wt 181.0 lb

## 2018-04-27 DIAGNOSIS — Z1211 Encounter for screening for malignant neoplasm of colon: Secondary | ICD-10-CM

## 2018-04-27 MED ORDER — PLENVU 140 G PO SOLR: 1.0000 | ORAL | 0 refills | Status: DC

## 2018-04-27 NOTE — Progress Notes (Signed)
No egg or soy allergy known to patient   Past sedation  Po n/v with surgeries , no intubation problems  No diet pills per patient No home 02 use per patient  No blood thinners per patient  Pt states chronic constipation. Stools are regularly hard, but not daily. Will do two day prep due to this. No A fib or A flutter  EMMI video sent to pt's e mail, pt declined [employer] coupon to pt.

## 2018-04-29 ENCOUNTER — Other Ambulatory Visit: Payer: Self-pay | Admitting: Obstetrics and Gynecology

## 2018-04-29 ENCOUNTER — Other Ambulatory Visit: Payer: Self-pay | Admitting: Endocrinology

## 2018-04-29 DIAGNOSIS — Z1231 Encounter for screening mammogram for malignant neoplasm of breast: Secondary | ICD-10-CM

## 2018-05-03 ENCOUNTER — Telehealth: Payer: Self-pay | Admitting: *Deleted

## 2018-05-03 NOTE — Telephone Encounter (Signed)
Yes. Thanks 

## 2018-05-03 NOTE — Telephone Encounter (Signed)
Copied from [place] [id number]. Topic: Inquiry >> May 03, 2018  2:21 PM Valla Leaver wrote: Reason for CRM: Patient would like to become a patient of Dr. Elease Hashimoto who see's her mother, Cyndia Diver, DOB[birth year]/31/1939? She is requesting for herself and her husband Serafina Royals, MRN:  [id number]. Please call back to confirm whether or not Dr. Wyline Beady will accept them both as new patients.   Appointments made for patient and husband.

## 2018-05-09 ENCOUNTER — Ambulatory Visit (AMBULATORY_SURGERY_CENTER): Payer: BLUE CROSS/BLUE SHIELD | Admitting: Gastroenterology

## 2018-05-09 ENCOUNTER — Encounter: Payer: Self-pay | Admitting: Gastroenterology

## 2018-05-09 VITALS — BP 147/68 | HR 62 | Temp 98.6°F | Resp 17 | Ht 67.0 in | Wt 181.0 lb

## 2018-05-09 DIAGNOSIS — Z1211 Encounter for screening for malignant neoplasm of colon: Secondary | ICD-10-CM | POA: Diagnosis not present

## 2018-05-09 MED ORDER — SODIUM CHLORIDE 0.9 % IV SOLN: 500.0000 mL | Freq: Once | INTRAVENOUS | Status: DC

## 2018-05-09 NOTE — Progress Notes (Signed)
A and O x3. Report to RN. Tolerated MAC anesthesia well.

## 2018-05-09 NOTE — Progress Notes (Signed)
Pt's states no medical or surgical changes since previsit or office visit. 

## 2018-05-09 NOTE — Op Note (Addendum)
[place] Patient Name: Laura [name] Procedure Date: 05/09/2018 9:30 AM MRN: [id number] Endoscopist: Mallie Mussel L. Loletha Carrow , MD Age: 62 Referring MD:  Date of Birth: 07/02/1956 Gender: Female Account #: [id number] Procedure:                Colonoscopy Indications:              Screening for colorectal malignant neoplasm (no                            polyps 02/2008) Medicines:                Monitored Anesthesia Care Procedure:                Pre-Anesthesia Assessment:                           - Prior to the procedure, a History and Physical                            was performed, and patient medications and                            allergies were reviewed. The patient's tolerance of                            previous anesthesia was also reviewed. The risks                            and benefits of the procedure and the sedation                            options and risks were discussed with the patient.                            All questions were answered, and informed consent                            was obtained. Anticoagulants: The patient has taken                            aspirin. It was decided not to withhold this                            medication prior to the procedure. ASA Grade                            Assessment: II - A patient with mild systemic                            disease. After reviewing the risks and benefits,                            the patient was deemed in satisfactory condition to  undergo the procedure.                           After obtaining informed consent, the colonoscope                            was passed under direct vision. Throughout the                            procedure, the patient's blood pressure, pulse, and                            oxygen saturations were monitored continuously. The                            Model PCF-[id number]) scope was introduced         through the anus and advanced to the the cecum,                            identified by appendiceal orifice and ileocecal                            valve. The colonoscopy was performed without                            difficulty. The patient tolerated the procedure                            well. The quality of the bowel preparation was                            excellent. The ileocecal valve, appendiceal                            orifice, and rectum were photographed. The quality                            of the bowel preparation was evaluated using the                            BBPS [facility] Bowel Preparation Scale) with scores                            of: Right Colon = 3, Transverse Colon = 3 and Left                            Colon = 3 (entire mucosa seen well with no residual                            staining, small fragments of stool or opaque                            liquid). The total BBPS score equals 9. Scope In:  9:46:18 AM Scope Out: 9:59:05 AM Scope Withdrawal Time: 0 hours 8 minutes 55 seconds  Total Procedure Duration: 0 hours 12 minutes 47 seconds  Findings:                 The perianal and digital rectal examinations were                            normal.                           Multiple diverticula were found in the left colon.                           The exam was otherwise without abnormality on                            direct and retroflexion views. Complications:            No immediate complications. Estimated Blood Loss:     Estimated blood loss: none. Impression:               - Diverticulosis in the left colon.                           - The examination was otherwise normal on direct                            and retroflexion views.                           - No specimens collected. Recommendation:           - Patient has a contact number available for                            emergencies. The signs and symptoms of potential                             delayed complications were discussed with the                            patient. Return to normal activities tomorrow.                            Written discharge instructions were provided to the                            patient.                           - Resume previous diet.                           - Continue present medications.                           - Repeat colonoscopy in 10 years for screening  purposes. Aidel Davisson L. Loletha Carrow, MD 05/09/2018 10:05:31 AM This report has been signed electronically.

## 2018-05-09 NOTE — Patient Instructions (Signed)
YOU HAD AN ENDOSCOPIC PROCEDURE TODAY AT [facility]:   Refer to the procedure report that was given to you for any specific questions about what was found during the examination.  If the procedure report does not answer your questions, please call your gastroenterologist to clarify.  If you requested that your care partner not be given the details of your procedure findings, then the procedure report has been included in a sealed envelope for you to review at your convenience later.  YOU SHOULD EXPECT: Some feelings of bloating in the abdomen. Passage of more gas than usual.  Walking can help get rid of the air that was put into your GI tract during the procedure and reduce the bloating. If you had a lower endoscopy (such as a colonoscopy or flexible sigmoidoscopy) you may notice spotting of blood in your stool or on the toilet paper. If you underwent a bowel prep for your procedure, you may not have a normal bowel movement for a few days.  Please Note:  You might notice some irritation and congestion in your nose or some drainage.  This is from the oxygen used during your procedure.  There is no need for concern and it should clear up in a day or so.  SYMPTOMS TO REPORT IMMEDIATELY:   Following lower endoscopy (colonoscopy or flexible sigmoidoscopy):  Excessive amounts of blood in the stool  Significant tenderness or worsening of abdominal pains  Swelling of the abdomen that is new, acute  Fever of 100F or higher  For urgent or emergent issues, a gastroenterologist can be reached at any hour by calling [phone].   DIET:  We do recommend a small meal at first, but then you may proceed to your regular diet.  Drink plenty of fluids but you should avoid alcoholic beverages for 24 hours.  ACTIVITY:  You should plan to take it easy for the rest of today and you should NOT DRIVE or use heavy machinery until tomorrow (because of the sedation medicines used during the test).     FOLLOW UP: Our staff will call the number listed on your records the next business day following your procedure to check on you and address any questions or concerns that you may have regarding the information given to you following your procedure. If we do not reach you, we will leave a message.  However, if you are feeling well and you are not experiencing any problems, there is no need to return our call.  We will assume that you have returned to your regular daily activities without incident.  If any biopsies were taken you will be contacted by phone or by letter within the next 1-3 weeks.  Please call us at [phone] if you have not heard about the biopsies in 3 weeks.   Repeat next Colonoscopy screening in 10 years Diverticulosis (handout given)  SIGNATURES/CONFIDENTIALITY: You and/or your care partner have signed paperwork which will be entered into your electronic medical record.  These signatures attest to the fact that that the information above on your After Visit Summary has been reviewed and is understood.  Full responsibility of the confidentiality of this discharge information lies with you and/or your care-partner.

## 2018-05-10 ENCOUNTER — Telehealth: Payer: Self-pay | Admitting: *Deleted

## 2018-05-10 NOTE — Telephone Encounter (Signed)
  Follow up Call-  Call back number 05/09/2018  Post procedure Call Back phone  # [phone]  Permission to leave phone message Yes  Some recent data might be hidden     Patient questions:  Do you have a fever, pain , or abdominal swelling? No. Pain Score  0 *  Have you tolerated food without any problems? Yes.    Have you been able to return to your normal activities? Yes.    Do you have any questions about your discharge instructions: Diet   No. Medications  No. Follow up visit  No.  Do you have questions or concerns about your Care? No.  Actions: * If pain score is 4 or above: No action needed, pain <4.

## 2018-05-27 ENCOUNTER — Encounter: Payer: BLUE CROSS/BLUE SHIELD | Admitting: Gastroenterology

## 2018-07-04 ENCOUNTER — Ambulatory Visit
Admission: RE | Admit: 2018-07-04 | Discharge: 2018-07-04 | Disposition: A | Discharge status: Home / Self Care | Payer: BLUE CROSS/BLUE SHIELD | Source: Ambulatory Visit | Attending: Endocrinology | Admitting: Endocrinology

## 2018-07-04 ENCOUNTER — Ambulatory Visit
Admission: RE | Admit: 2018-07-04 | Discharge: 2018-07-04 | Disposition: A | Discharge status: Home / Self Care | Payer: BLUE CROSS/BLUE SHIELD | Source: Ambulatory Visit | Attending: Obstetrics and Gynecology | Admitting: Obstetrics and Gynecology

## 2018-07-04 DIAGNOSIS — M810 Age-related osteoporosis without current pathological fracture: Secondary | ICD-10-CM

## 2018-07-04 DIAGNOSIS — Z1231 Encounter for screening mammogram for malignant neoplasm of breast: Secondary | ICD-10-CM

## 2018-07-24 ENCOUNTER — Other Ambulatory Visit: Payer: Self-pay | Admitting: Endocrinology

## 2018-07-24 NOTE — Telephone Encounter (Signed)
Please refill x 1 Further refills from new pcp

## 2018-07-28 ENCOUNTER — Ambulatory Visit: Payer: BLUE CROSS/BLUE SHIELD | Admitting: Endocrinology

## 2018-07-29 ENCOUNTER — Other Ambulatory Visit: Payer: Self-pay

## 2018-07-29 ENCOUNTER — Encounter: Payer: Self-pay | Admitting: Family Medicine

## 2018-07-29 ENCOUNTER — Ambulatory Visit: Payer: BLUE CROSS/BLUE SHIELD | Admitting: Family Medicine

## 2018-07-29 VITALS — BP 136/86 | HR 85 | Temp 97.8°F | Ht 64.5 in | Wt 182.9 lb

## 2018-07-29 DIAGNOSIS — I10 Essential (primary) hypertension: Secondary | ICD-10-CM | POA: Diagnosis not present

## 2018-07-29 DIAGNOSIS — Z0000 Encounter for general adult medical examination without abnormal findings: Secondary | ICD-10-CM | POA: Diagnosis not present

## 2018-07-29 DIAGNOSIS — Z23 Encounter for immunization: Secondary | ICD-10-CM

## 2018-07-29 LAB — CBC WITH DIFFERENTIAL/PLATELET
BASOS PCT: 0.8 % (ref 0.0–3.0)
Basophils Absolute: 0.1 10*3/uL (ref 0.0–0.1)
EOS PCT: 1.2 % (ref 0.0–5.0)
Eosinophils Absolute: 0.1 10*3/uL (ref 0.0–0.7)
HEMATOCRIT: 42.3 % (ref 36.0–46.0)
HEMOGLOBIN: 14.2 g/dL (ref 12.0–15.0)
Lymphocytes Relative: 46.2 % — ABNORMAL HIGH (ref 12.0–46.0)
Lymphs Abs: 3 10*3/uL (ref 0.7–4.0)
MCHC: 33.5 g/dL (ref 30.0–36.0)
MCV: 88.7 fl (ref 78.0–100.0)
MONO ABS: 0.4 10*3/uL (ref 0.1–1.0)
Monocytes Relative: 5.7 % (ref 3.0–12.0)
NEUTROS ABS: 3 10*3/uL (ref 1.4–7.7)
Neutrophils Relative %: 46.1 % (ref 43.0–77.0)
PLATELETS: 301 10*3/uL (ref 150.0–400.0)
RBC: 4.77 Mil/uL (ref 3.87–5.11)
RDW: 13.3 % (ref 11.5–15.5)
WBC: 6.6 10*3/uL (ref 4.0–10.5)

## 2018-07-29 LAB — HEPATIC FUNCTION PANEL
ALBUMIN: 4.7 g/dL (ref 3.5–5.2)
ALT: 31 U/L (ref 0–35)
AST: 25 U/L (ref 0–37)
Alkaline Phosphatase: 96 U/L (ref 39–117)
BILIRUBIN TOTAL: 0.5 mg/dL (ref 0.2–1.2)
Bilirubin, Direct: 0.1 mg/dL (ref 0.0–0.3)
Total Protein: 7.4 g/dL (ref 6.0–8.3)

## 2018-07-29 LAB — TSH: TSH: 3.39 u[IU]/mL (ref 0.35–4.50)

## 2018-07-29 LAB — LIPID PANEL
CHOLESTEROL: 190 mg/dL (ref 0–200)
HDL: 38.2 mg/dL — ABNORMAL LOW (ref >39.00)
TRIGLYCERIDES: 442 mg/dL — AB (ref 0.0–149.0)
Total CHOL/HDL Ratio: 5

## 2018-07-29 LAB — BASIC METABOLIC PANEL
BUN: 17 mg/dL (ref 6–23)
CALCIUM: 9.8 mg/dL (ref 8.4–10.5)
CO2: 29 meq/L (ref 19–32)
CREATININE: 0.67 mg/dL (ref 0.40–1.20)
Chloride: 105 mEq/L (ref 96–112)
GFR: 94.88 mL/min (ref >60.00)
Glucose, Bld: 116 mg/dL — ABNORMAL HIGH (ref 70–99)
POTASSIUM: 4.1 meq/L (ref 3.5–5.1)
SODIUM: 141 meq/L (ref 135–145)

## 2018-07-29 LAB — LDL CHOLESTEROL, DIRECT: LDL DIRECT: 97 mg/dL

## 2018-07-29 MED ORDER — LOSARTAN POTASSIUM 25 MG PO TABS: ORAL_TABLET | ORAL | 3 refills | Status: DC

## 2018-07-29 NOTE — Progress Notes (Signed)
Subjective:     Patient ID: Laura [name], female   DOB: 22-Oct-1955, 62 y.o.   MRN: [id number]  HPI Patient seen for physical exam and to establish care with me.  She has history of hypertension treated with low-dose losartan otherwise healthy.  She had echo way back in 2011 which showed mild LVH.  No history of CAD.  She is followed by gynecologist regularly.  She has had previous hepatitis C screening.  She had colonoscopy August of this year.  Tetanus up-to-date.  Pap smear and mammogram up-to-date.  She sees dermatologist yearly for skin cancer screening.  Still needs flu vaccine.  Past Medical History:  Diagnosis Date  . ABNORMAL ELECTROCARDIOGRAM 08/05/2010   02-19-15 EKG shows NSR. Pt voices no issues with heart related problems.  . ACNE, MILD 02/17/2008   not a problem so much 06-18-15  . ALLERGIC RHINITIS 01/25/2008  . Allergy   . ASTHMA 01/25/2008  . Asthma   . 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 CONSTIPATION 02/21/2008   uses fiber to control  . ESOPHAGEAL STRICTURE 02/17/2008   dilation done - no problems since.  Marland Kitchen GERD 01/25/2008   uses Prevacid as needed  . HYPERGLYCEMIA   . HYPERTENSION 08/05/2010  . Impaired glucose tolerance 01/11/2011  . METATARSALGIA 11/08/2007   work related- no recent issues"wearing steel toe shoes at the time"  . PONV (postoperative nausea and vomiting)   . Previous back surgery 10/26/17, 05/26/18  . RECTAL BLEEDING 01/04/2008   06-18-15 no problems now.  . Tendonitis    right wrist-has soft splint in place.  . Tibialis tendinitis 05/13/2010  . 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  Marland Kitchen VENTRICULAR HYPERTROPHY, LEFT 09/23/2010  . Wears glasses    Past Surgical History:  Procedure Laterality Date  . ABDOMINAL HYSTERECTOMY  2002   TAH-LTSO-LOA  . ARTHROSCOPIC REPAIR ACL Right 10/1998   right knee   . BACK SURGERY  2019  . BREAST EXCISIONAL BIOPSY  Left 2015   benign  . BREAST SURGERY Left    lumpectomy-benign 2015  . CARDIOVASCULAR STRESS TEST  2011  . CARPAL TUNNEL RELEASE  2014   left  . CHOLECYSTECTOMY  1985   open  . COLONOSCOPY    . COLONOSCOPY W/ POLYPECTOMY    . DILATION AND CURETTAGE OF UTERUS    . ELBOW ARTHROPLASTY  2014   left  . ESOPHAGOGASTRODUODENOSCOPY  04/01/1998  . KNEE ARTHROSCOPY Bilateral    meniscus tear-left.  Marland Kitchen NASAL SINUS SURGERY     s/p  . ORIF ELBOW FRACTURE  12/23/2011   Procedure: OPEN REDUCTION INTERNAL FIXATION (ORIF) ELBOW/OLECRANON FRACTURE;  Surgeon: Linna Hoff, MD;  Location: [facility];  Service: Orthopedics;  Laterality: Left;  LEFT ELBOW ORIF OF PROXIMAL OLECRANON AND RADIAL HEAD, POSSIBLE ARTHROPLASTY  . OVARIAN CYST REMOVAL    . TOTAL KNEE ARTHROPLASTY Right 06/24/2015   Procedure: RIGHT TOTAL KNEE ARTHROPLASTY;  Surgeon: Gaynelle Arabian, MD;  Location: [facility];  Service: Orthopedics;  Laterality: Right;  . UPPER GASTROINTESTINAL ENDOSCOPY    . US ECHOCARDIOGRAPHY  2011    reports that she has never smoked. She has never used smokeless tobacco. She reports that she does not drink alcohol or use drugs. family history includes Cancer in her other; Diabetes in her mother; Thyroid disease in her mother. No Known Allergies   Review of Systems  Constitutional:  Negative for activity change, appetite change, fatigue, fever and unexpected weight change.  HENT: Negative for ear pain, hearing loss, sore throat and trouble swallowing.   Eyes: Negative for visual disturbance.  Respiratory: Negative for cough and shortness of breath.   Cardiovascular: Negative for chest pain and palpitations.  Gastrointestinal: Negative for abdominal pain, blood in stool, constipation and diarrhea.  Genitourinary: Negative for dysuria and hematuria.  Musculoskeletal: Negative for arthralgias, back pain and myalgias.  Skin: Negative for rash.  Neurological: Negative for dizziness, syncope and headaches.   Hematological: Negative for adenopathy.  Psychiatric/Behavioral: Negative for confusion and dysphoric mood.       Objective:   Physical Exam  Constitutional: She is oriented to person, place, and time. She appears well-developed and well-nourished.  HENT:  Head: Normocephalic and atraumatic.  Eyes: Pupils are equal, round, and reactive to light. EOM are normal.  Neck: Normal range of motion. Neck supple. No thyromegaly present.  Cardiovascular: Normal rate, regular rhythm and normal heart sounds.  No murmur heard. Pulmonary/Chest: Breath sounds normal. No respiratory distress. She has no wheezes. She has no rales.  Abdominal: Soft. Bowel sounds are normal. She exhibits no distension and no mass. There is no tenderness. There is no rebound and no guarding.  Musculoskeletal: Normal range of motion. She exhibits no edema.  Lymphadenopathy:    She has no cervical adenopathy.  Neurological: She is alert and oriented to person, place, and time. She displays normal reflexes. No cranial nerve deficit.  Skin: No rash noted.  Psychiatric: She has a normal mood and affect. Her behavior is normal. Judgment and thought content normal.       Assessment:     Physical exam.  The following health maintenance issues were discussed    Plan:     -Flu vaccine given -Reviewed other health maintenance which is up-to-date -Refill losartan for 1 year -Obtain screening labs -follow up for yearly physical and sooner as needed - she will continue with yearly gyn follow up.  Eulas Post MD [facility] at [facility]

## 2018-07-31 ENCOUNTER — Encounter: Payer: Self-pay | Admitting: Family Medicine

## 2019-02-14 ENCOUNTER — Other Ambulatory Visit: Payer: Self-pay | Admitting: Neurosurgery

## 2019-02-17 NOTE — H&P (Signed)
Is see the Patient ID:   [id number] Patient: Laura [name]  Date of Birth: 1956-02-13 Visit Type: Office Visit   Date: 02/13/2019 09:45 AM Provider: Marchia Meiers. Vertell Limber MD   This 63 year old female presents for back pain.  HISTORY OF PRESENT ILLNESS:  1.  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  The patient has severe left sided low back and left leg pain.  She is currently grading her pain at 6/10 in severity.  She is weak in her EHL and dorsiflexion on the left.  She has significant left sciatic notch discomfort to palpation.  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  This will consist of TLIF at L4-5 with redo diskectomy on the left.         Medical/Surgical/Interim History Reviewed, no change.  Last detailed document date:11/15/2017.     PAST MEDICAL HISTORY, SURGICAL HISTORY, FAMILY HISTORY, SOCIAL HISTORY AND REVIEW OF SYSTEMS I have reviewed the patient's past medical, surgical, family and social history as well as the comprehensive review of systems as included on the [facility] history form dated 08/10/2018, which I have signed.  Family History:  Reviewed, no changes.  Last detailed document date:07/19/2017.   Social History: Reviewed, no changes. Last detailed document date: 07/19/2017.    MEDICATIONS: (added, continued or stopped this visit) Started Medication Directions Instruction Stopped   Aspir-81 81 mg tablet,delayed release take 1 tablet by oral route  every day     Calcium 600-D3 Plus 600 mg calcium-800 unit-50 mg tablet      Fiber Gummies (with chromium) 2 gram-100 mcg chewable tablet      fish oil  ORAL     01/30/2019 hydrocodone 5 mg-acetaminophen 325 mg tablet take 1 tablet by oral route  every 6 hours as needed for pain   02/14/2019   losartan 25 mg tablet take 1 tablet by oral route  every day    01/30/2019 Medrol (Pak) 4 mg tablets in a dose pack Take as directed    12/01/2018 methocarbamol 500 mg tablet take 1/2-1 tablet by oral route every 8 hours as needed for spams    01/30/2019 Neurontin 300 mg capsule take 1 capsule by oral route 3 times every day     one a day vitamin  ORAL     08/10/2018 tramadol 50 mg tablet take 1 tablet by oral route  every 6 hours as needed     vitamin E 400 unit capsule        ALLERGIES: Ingredient Reaction Medication Name Comment  NO KNOWN ALLERGIES     No known allergies.    PHYSICAL EXAM:   Vitals Date Temp F BP Pulse Ht In Wt Lb BMI BSA Pain Score  02/13/2019 98 138/90 69 67 172 26.94  6/10      IMPRESSION:   The patient is in severe pain and has weakness in her left leg with a large recurrent disc herniation at the L4-5 level on the left.  PLAN:  I have recommended proceeding with redo decompression with TLIF fusion L4-5 on the left.  She was given a prescription for an LSO brace today.  Surgery has been scheduled for June 19th at [facility].  Orders: Diagnostic Procedures: Assessment Procedure  M48.061 Lumbar Spine- AP/Lat  Instruction(s)/Education: Assessment Instruction  I10 Hypertension education  Z68.26 Lifestyle education regarding diet  Miscellaneous: Assessment   M48.061 LSO Brace   Completed Orders (this encounter) Order Details Reason Side Interpretation Result Initial Treatment Date Region  Hypertension education Continue to monitor blood pressure, if blood pressure remains elevated contact primary doctor        Lifestyle education regarding diet Patient encouraged to eat a well balance diet         Assessment/Plan   # Detail Type Description   1. Assessment Radiculopathy, lumbar region (M54.16).       2. Assessment Herniated nucleus pulposus, L4-5 (M51.26).       3. Assessment Lumbar stenosis without neurogenic claudication  (M48.061).   Plan Orders LSO Brace. Clinical information/comments: GAVE PATIENT WRITTEN SCRIPT BIOTECH.       4. Assessment Essential (primary) hypertension (I10).       5. Assessment Body mass index (BMI) 26.0-26.9, adult (V03.50).   Plan Orders Today's instructions / counseling include(s) Lifestyle education regarding diet. Clinical information/comments: Patient encouraged to eat a well balance diet.         Pain Management Plan Pain Scale: 6/10. Method: Numeric Pain Intensity Scale. Location: back. Onset: 12/01/2016. Duration: varies. Quality: discomforting. Pain management follow-up plan of care: Patient taking medication as prescribed.  Fall Risk Plan The patient has not fallen in the last year.              Provider:  Marchia Meiers. Vertell Limber MD  02/15/2019 01:58 PM    Dictation edited by: Marchia Meiers. Vertell Limber    CC Providers: Renato Shin [facility] [place],  [place]  [id number]-   James Kramer  [place], [place] [address]-               Electronically signed by Marchia Meiers Vertell Limber MD on 02/15/2019 01:58 PM  Patient ID:   [id number] Patient: Laura [name]  Date of Birth: 10-21-1955 Visit Type: Office Visit   Date: 01/30/2019 10:45 AM Provider: Marchia Meiers. Vertell Limber MD   This 63 year old female presents for back pain.  HISTORY OF PRESENT ILLNESS:  1.  back pain  05/26/2018 redo left L4-5 microdiskectomy  Patient returns reporting low back and left leg pain x2 weeks after squatting to place a bottle of shampoo on a shelf. Patient notes that the severity of her pain is interfering with her ability to sleep. She describes her pain as similar to her preoperative pain.  Old tramadol prescription offers no relief.  Robaxin prescribed in March for lumbar strain offered significant relief at the time; however, offers no relief at present.  X-ray on [place]         Medical/Surgical/Interim  History Reviewed, no change.  Last detailed document date:11/15/2017.     PAST MEDICAL HISTORY, SURGICAL HISTORY, FAMILY HISTORY, SOCIAL HISTORY AND REVIEW OF SYSTEMS I have reviewed the patient's past medical, surgical, family and social history as well as the comprehensive review of systems as included on the [facility] history form dated 08/10/2018, which I have signed.  Family History:  Reviewed, no changes.  Last detailed document date:07/19/2017.   Social History: Reviewed, no changes. Last detailed document date: 07/19/2017.    MEDICATIONS: (added, continued or stopped this visit) Started Medication Directions Instruction Stopped   Aspir-81 81 mg tablet,delayed release take 1 tablet by oral route  every day     Calcium  600-D3 Plus 600 mg calcium-800 unit-50 mg tablet      Fiber Gummies (with chromium) 2 gram-100 mcg chewable tablet      fish oil  ORAL     06/27/2018 hydrocodone 5 mg-acetaminophen 325 mg tablet take 1 tablet by oral route  every 6 hours as needed for pain  01/30/2019  01/30/2019 hydrocodone 5 mg-acetaminophen 325 mg tablet take 1 tablet by oral route  every 6 hours as needed for pain     losartan 25 mg tablet take 1 tablet by oral route  every day    01/30/2019 Medrol (Pak) 4 mg tablets in a dose pack Take as directed    12/01/2018 methocarbamol 500 mg tablet take 1/2-1 tablet by oral route every 8 hours as needed for spams    01/30/2019 Neurontin 300 mg capsule take 1 capsule by oral route 3 times every day     one a day vitamin  ORAL     08/10/2018 tramadol 50 mg tablet take 1 tablet by oral route  every 6 hours as needed     vitamin E 400 unit capsule        ALLERGIES: Ingredient Reaction Medication Name Comment  NO KNOWN ALLERGIES     No known allergies.    PHYSICAL EXAM:   Vitals Date Temp F BP Pulse Ht In Wt Lb BMI BSA Pain Score  01/30/2019  183/90 93 67 175.8 27.53  8/10      IMPRESSION:   4-view L-spine  X-rays reveal no fractures or instability, hip joints nl, asymmetry of disc space at L4-5, no spondylolisthesis, disc height loss at L4-5. Patient presents with new low back and left lower extremity pain. Recommended new imaging - recommended L-spine MRI with and without contrast. Advised patient that if surgical intervention is required based upon imaging then a fusion would likely be needed. Discussed medications - Rx Medrol DosePak, Rx 300 mg gabapentin - advised patient to take initially take 300 mg nightly. Discussed with patient dosage can be gradually increased by 300 mg every 2-3 days up to 300 mg TID if gabapentin is found helpful. Patient will follow-up to review imaging once imaging is complete.  PLAN:  1. Rx Medrol DosePak 2. Rx 300 mg gabapentin - can be gradually increased by 300 mg every 2-3 days up to 300 mg TID 3. L-spine MRI with and without contrast to be scheduled 4. Follow-up to review imaging  Orders: Diagnostic Procedures: Assessment Procedure  M54.16 MRI Spine/lumb With & W/o Contrast  Instruction(s)/Education: Assessment Instruction  I10 Hypertension education  Z68.27 Lifestyle education regarding diet   Completed Orders (this encounter) Order Details Reason Side Interpretation Result Initial Treatment Date Region  Hypertension education Patient to follow up with primary care provider        Lifestyle education regarding diet Patient encouraged to eat a well balance diet         Assessment/Plan   # Detail Type Description   1. Assessment Lumbar radiculopathy (M54.16).       2. Assessment Herniated nucleus pulposus, lumbar (M51.26).       3. Assessment Degenerative lumbar spinal stenosis (M48.061).       4. Assessment Low back pain (M54.5).       5. Assessment Essential (primary) hypertension (I10).       6. Assessment Body mass index (BMI) 27.0-27.9, adult (V49.44).   Plan Orders Today's instructions / counseling include(s) Lifestyle education regarding  diet. Clinical information/comments: Patient encouraged to eat a well  balance diet.         Pain Management Plan Pain Scale: 8/10. Method: Numeric Pain Intensity Scale. Location: back. Onset: 12/01/2016. Duration: varies. Quality: discomforting. Pain management follow-up plan of care: Patient taking medication as prescribed.  Fall Risk Plan The patient has not fallen in the last year.     MEDICATIONS PRESCRIBED TODAY    Rx Quantity Refills  NEURONTIN 300 mg  90 1  MEDROL 4 mg  1 0  HYDROCODONE-ACETAMINOPHEN 5 mg-325 mg  60 0            Provider:  Marchia Meiers. Vertell Limber MD  01/31/2019 06:02 PM    Dictation edited by: Mirian Mo    CC Providers: Renato Shin [facility] [place],  [place]  [id number]-   James Kramer  [place], [place] [address]-               Electronically signed by Marchia Meiers Vertell Limber MD on 02/02/2019 01:57 PM

## 2019-02-27 NOTE — Pre-Procedure Instructions (Signed)
Laura [name]  02/27/2019     [place], [place] [address] [place] [address] Phone: [phone] Fax: [phone]  [id number] - [place], [place] AT [address] [place] [address] Phone: [phone] Fax: [phone]   Your procedure is scheduled on Friday, June 19th.  Report to [facility] Entrance A at 10:10 A.M.  Call this number if you have problems the morning of surgery:  [phone]   Remember:  Do not eat or drink after midnight.    Take these medicines the morning of surgery with A SIP OF WATER  NONE  If needed - fluticasone (FLONASE)   7 days prior to surgery STOP taking any Aspirin (unless otherwise instructed by your surgeon), Aleve, Naproxen, Ibuprofen, Motrin, Advil, Goody's, BC's, all herbal medications, fish oil, and all vitamins.  Follow your surgeon's instructions on when to stop Aspirin.  If no instructions were given by your surgeon then you will need to call the office to get those instructions.      Do not wear jewelry, make-up or nail polish.  Do not wear lotions, powders, or perfumes, or deodorant.  Do not shave 48 hours prior to surgery.   Do not bring valuables to the hospital.  [facility] is not responsible for any belongings or valuables.  Contacts, dentures or bridgework may not be worn into surgery.  Leave your suitcase in the car.  After surgery it may be brought to your room.  For patients admitted to the hospital, discharge time will be determined by your treatment team.  Patients discharged the day of surgery will not be allowed to drive home.   Special instructions: [place]- Preparing For Surgery  Before surgery, you can play an important role. Because skin is not sterile, your skin needs to be as free of germs as possible. You can reduce the number of germs on your skin by washing with CHG (chlorahexidine gluconate)  Soap before surgery.  CHG is an antiseptic cleaner which kills germs and bonds with the skin to continue killing germs even after washing.    Oral Hygiene is also important to reduce your risk of infection.  Remember - BRUSH YOUR TEETH THE MORNING OF SURGERY WITH YOUR REGULAR TOOTHPASTE  Please do not use if you have an allergy to CHG or antibacterial soaps. If your skin becomes reddened/irritated stop using the CHG.  Do not shave (including legs and underarms) for at least 48 hours prior to first CHG shower. It is OK to shave your face.  Please follow these instructions carefully.   1. Shower the NIGHT BEFORE SURGERY and the MORNING OF SURGERY with CHG.   2. If you chose to wash your hair, wash your hair first as usual with your normal shampoo.  3. After you shampoo, rinse your hair and body thoroughly to remove the shampoo.  4. Use CHG as you would any other liquid soap. You can apply CHG directly to the skin and wash gently with a scrungie or a clean washcloth.   5. Apply the CHG Soap to your body ONLY FROM THE NECK DOWN.  Do not use on open wounds or open sores. Avoid contact with your eyes, ears, mouth and genitals (private parts). Wash Face and genitals (private parts)  with your normal soap.  6. Wash thoroughly, paying special attention to the area where your surgery will be performed.  7. Thoroughly rinse your  body with warm water from the neck down.  8. DO NOT shower/wash with your normal soap after using and rinsing off the CHG Soap.  9. Pat yourself dry with a CLEAN TOWEL.  10. Wear CLEAN PAJAMAS to bed the night before surgery, wear comfortable clothes the morning of surgery  11. Place CLEAN SHEETS on your bed the night of your first shower and DO NOT SLEEP WITH PETS.  Day of Surgery:  Do not apply any deodorants/lotions.  Please wear clean clothes to the hospital/surgery center.   Remember to brush your teeth WITH YOUR REGULAR TOOTHPASTE.  Please read over the  following fact sheets that you were given. Pain Booklet, Coughing and Deep Breathing, MRSA Information and Surgical Site Infection Prevention

## 2019-02-28 ENCOUNTER — Encounter (HOSPITAL_COMMUNITY): Payer: Self-pay

## 2019-02-28 ENCOUNTER — Encounter (HOSPITAL_COMMUNITY)
Admission: RE | Admit: 2019-02-28 | Discharge: 2019-02-28 | Disposition: A | Discharge status: Home / Self Care | Payer: BC Managed Care – PPO | Source: Ambulatory Visit | Attending: Neurosurgery | Admitting: Neurosurgery

## 2019-02-28 ENCOUNTER — Other Ambulatory Visit (HOSPITAL_COMMUNITY)
Admission: RE | Admit: 2019-02-28 | Discharge: 2019-02-28 | Disposition: A | Discharge status: Home / Self Care | Payer: BC Managed Care – PPO | Source: Ambulatory Visit | Attending: Neurosurgery | Admitting: Neurosurgery

## 2019-02-28 ENCOUNTER — Other Ambulatory Visit: Payer: Self-pay

## 2019-02-28 DIAGNOSIS — I10 Essential (primary) hypertension: Secondary | ICD-10-CM | POA: Insufficient documentation

## 2019-02-28 DIAGNOSIS — Z1159 Encounter for screening for other viral diseases: Secondary | ICD-10-CM | POA: Diagnosis not present

## 2019-02-28 DIAGNOSIS — Z01818 Encounter for other preprocedural examination: Secondary | ICD-10-CM | POA: Insufficient documentation

## 2019-02-28 LAB — SURGICAL PCR SCREEN
MRSA, PCR: NEGATIVE
Staphylococcus aureus: NEGATIVE

## 2019-02-28 LAB — BASIC METABOLIC PANEL
Anion gap: 9 (ref 5–15)
BUN: 8 mg/dL (ref 8–23)
CO2: 23 mmol/L (ref 22–32)
Calcium: 8.9 mg/dL (ref 8.9–10.3)
Chloride: 101 mmol/L (ref 98–111)
Creatinine, Ser: 0.48 mg/dL (ref 0.44–1.00)
GFR calc Af Amer: >60 mL/min (ref >60)
GFR calc non Af Amer: >60 mL/min (ref >60)
Glucose, Bld: 91 mg/dL (ref 70–99)
Potassium: 4 mmol/L (ref 3.5–5.1)
Sodium: 133 mmol/L — ABNORMAL LOW (ref 135–145)

## 2019-02-28 LAB — TYPE AND SCREEN
ABO/RH(D): A POS
Antibody Screen: NEGATIVE

## 2019-02-28 LAB — CBC
HCT: 44.6 % (ref 36.0–46.0)
Hemoglobin: 14.2 g/dL (ref 12.0–15.0)
MCH: 29.4 pg (ref 26.0–34.0)
MCHC: 31.8 g/dL (ref 30.0–36.0)
MCV: 92.3 fL (ref 80.0–100.0)
Platelets: 311 10*3/uL (ref 150–400)
RBC: 4.83 MIL/uL (ref 3.87–5.11)
RDW: 13 % (ref 11.5–15.5)
WBC: 6.6 10*3/uL (ref 4.0–10.5)
nRBC: 0 % (ref 0.0–0.2)

## 2019-02-28 LAB — ABO/RH: ABO/RH(D): A POS

## 2019-02-28 NOTE — Progress Notes (Signed)
PCP - Dr. Carolann Littler Cardiologist - denies  Chest x-ray - denies EKG - 02/28/2019 Stress Test - per patient "about 10 years ago, normal results" ECHO - 08/05/10 Cardiac Cath - denies  Sleep Study - denies CPAP - N/A  Blood Thinner Instructions: N/A Aspirin Instructions:LD 02/24/2019  Anesthesia review: No  Coronavirus Screening  Have you experienced the following symptoms:  Cough yes/no: No Fever (>100.66F)  yes/no: No Runny nose yes/no: No Sore throat yes/no: No Difficulty breathing/shortness of breath  yes/no: No  Have you or a family member traveled in the last 14 days and where? yes/no: No  If the patient indicates "YES" to the above questions, their PAT will be rescheduled to limit the exposure to others and, the surgeon will be notified. THE PATIENT WILL NEED TO BE ASYMPTOMATIC FOR 14 DAYS.   If the patient is not experiencing any of these symptoms, the PAT nurse will instruct them to NOT bring anyone with them to their appointment since they may have these symptoms or traveled as well.   Please remind your patients and families that hospital visitation restrictions are in effect and the importance of the restrictions.   Patient denies shortness of breath, fever, cough and chest pain at PAT appointment   Patient verbalized understanding of instructions that were given to them at the PAT appointment. Patient was also instructed that they will need to review over the PAT instructions again at home before surgery.

## 2019-03-01 LAB — NOVEL CORONAVIRUS, NAA (HOSP ORDER, SEND-OUT TO REF LAB; TAT 18-24 HRS): SARS-CoV-2, NAA: NOT DETECTED

## 2019-03-03 ENCOUNTER — Inpatient Hospital Stay (HOSPITAL_COMMUNITY): Payer: BC Managed Care – PPO

## 2019-03-03 ENCOUNTER — Inpatient Hospital Stay (HOSPITAL_COMMUNITY): Payer: BC Managed Care – PPO | Admitting: Certified Registered Nurse Anesthetist

## 2019-03-03 ENCOUNTER — Inpatient Hospital Stay (HOSPITAL_COMMUNITY)
Admission: RE | Admit: 2019-03-03 | Discharge: 2019-03-04 | DRG: 455 | Disposition: A | Discharge status: Home / Self Care | Payer: BC Managed Care – PPO | Attending: Neurosurgery | Admitting: Neurosurgery

## 2019-03-03 ENCOUNTER — Encounter (HOSPITAL_COMMUNITY): Payer: Self-pay | Admitting: Certified Registered Nurse Anesthetist

## 2019-03-03 ENCOUNTER — Encounter (HOSPITAL_COMMUNITY)
Admission: RE | Disposition: A | Discharge status: Home / Self Care | Payer: Self-pay | Source: Home / Self Care | Attending: Neurosurgery

## 2019-03-03 ENCOUNTER — Other Ambulatory Visit: Payer: Self-pay

## 2019-03-03 DIAGNOSIS — K219 Gastro-esophageal reflux disease without esophagitis: Secondary | ICD-10-CM | POA: Diagnosis present

## 2019-03-03 DIAGNOSIS — M48061 Spinal stenosis, lumbar region without neurogenic claudication: Secondary | ICD-10-CM | POA: Diagnosis present

## 2019-03-03 DIAGNOSIS — M5126 Other intervertebral disc displacement, lumbar region: Secondary | ICD-10-CM | POA: Diagnosis present

## 2019-03-03 DIAGNOSIS — Z1211 Encounter for screening for malignant neoplasm of colon: Secondary | ICD-10-CM

## 2019-03-03 DIAGNOSIS — M5116 Intervertebral disc disorders with radiculopathy, lumbar region: Secondary | ICD-10-CM | POA: Diagnosis present

## 2019-03-03 DIAGNOSIS — Z79891 Long term (current) use of opiate analgesic: Secondary | ICD-10-CM | POA: Diagnosis not present

## 2019-03-03 DIAGNOSIS — I10 Essential (primary) hypertension: Secondary | ICD-10-CM | POA: Diagnosis present

## 2019-03-03 DIAGNOSIS — Z79899 Other long term (current) drug therapy: Secondary | ICD-10-CM

## 2019-03-03 DIAGNOSIS — Z7982 Long term (current) use of aspirin: Secondary | ICD-10-CM

## 2019-03-03 DIAGNOSIS — Z419 Encounter for procedure for purposes other than remedying health state, unspecified: Secondary | ICD-10-CM

## 2019-03-03 DIAGNOSIS — G5780 Other specified mononeuropathies of unspecified lower limb: Secondary | ICD-10-CM

## 2019-03-03 HISTORY — PX: TRANSFORAMINAL LUMBAR INTERBODY FUSION (TLIF) WITH PEDICLE SCREW FIXATION 1 LEVEL: SHX6141

## 2019-03-03 SURGERY — TRANSFORAMINAL LUMBAR INTERBODY FUSION (TLIF) WITH PEDICLE SCREW FIXATION 1 LEVEL
Anesthesia: General | Site: Back | Laterality: Left

## 2019-03-03 MED ORDER — SODIUM CHLORIDE 0.9 % IV SOLN: 500.0000 mL | Freq: Once | INTRAVENOUS | Status: DC

## 2019-03-03 MED ORDER — LIDOCAINE-EPINEPHRINE 1 %-1:100000 IJ SOLN
INTRAMUSCULAR | Status: DC | PRN
  Administered 2019-03-03: 10 mL

## 2019-03-03 MED ORDER — MIDAZOLAM HCL 2 MG/2ML IJ SOLN: 0.5000 mg | Freq: Once | INTRAMUSCULAR | Status: DC | PRN

## 2019-03-03 MED ORDER — HYDROCODONE-ACETAMINOPHEN 5-325 MG PO TABS
2.0000 | ORAL_TABLET | ORAL | Status: DC | PRN
  Administered 2019-03-03 – 2019-03-04 (×4): 2 via ORAL
  Filled 2019-03-03 (×4): qty 2

## 2019-03-03 MED ORDER — KCL IN DEXTROSE-NACL 20-5-0.45 MEQ/L-%-% IV SOLN: INTRAVENOUS | Status: DC

## 2019-03-03 MED ORDER — CEFAZOLIN SODIUM-DEXTROSE 2-4 GM/100ML-% IV SOLN
2.0000 g | Freq: Three times a day (TID) | INTRAVENOUS | Status: AC
  Administered 2019-03-03 – 2019-03-04 (×2): 2 g via INTRAVENOUS
  Filled 2019-03-03 (×2): qty 100

## 2019-03-03 MED ORDER — HYDROMORPHONE HCL 1 MG/ML IJ SOLN
INTRAMUSCULAR | Status: AC
  Administered 2019-03-03: 0.5 mg via INTRAVENOUS
  Filled 2019-03-03: qty 1

## 2019-03-03 MED ORDER — SCOPOLAMINE 1 MG/3DAYS TD PT72
1.0000 | MEDICATED_PATCH | Freq: Once | TRANSDERMAL | Status: DC
  Administered 2019-03-03: 1.5 mg via TRANSDERMAL
  Filled 2019-03-03: qty 1

## 2019-03-03 MED ORDER — FLUTICASONE PROPIONATE 50 MCG/ACT NA SUSP: 1.0000 | Freq: Every day | NASAL | Status: DC | PRN

## 2019-03-03 MED ORDER — THROMBIN 5000 UNITS EX SOLR
OROMUCOSAL | Status: DC | PRN
  Administered 2019-03-03: 5 mL via TOPICAL

## 2019-03-03 MED ORDER — SODIUM CHLORIDE 0.9 % IV SOLN: 250.0000 mL | INTRAVENOUS | Status: DC

## 2019-03-03 MED ORDER — BUPIVACAINE HCL (PF) 0.5 % IJ SOLN
INTRAMUSCULAR | Status: DC | PRN
  Administered 2019-03-03: 10 mL

## 2019-03-03 MED ORDER — ACETAMINOPHEN 650 MG RE SUPP: 650.0000 mg | RECTAL | Status: DC | PRN

## 2019-03-03 MED ORDER — OXYCODONE HCL 5 MG PO TABS: 5.0000 mg | ORAL_TABLET | ORAL | Status: DC | PRN

## 2019-03-03 MED ORDER — LIDOCAINE-EPINEPHRINE 1 %-1:100000 IJ SOLN
INTRAMUSCULAR | Status: AC
  Filled 2019-03-03: qty 1

## 2019-03-03 MED ORDER — CEFAZOLIN SODIUM-DEXTROSE 2-4 GM/100ML-% IV SOLN
2.0000 g | INTRAVENOUS | Status: AC
  Administered 2019-03-03: 2 g via INTRAVENOUS

## 2019-03-03 MED ORDER — ALUM & MAG HYDROXIDE-SIMETH 200-200-20 MG/5ML PO SUSP: 30.0000 mL | Freq: Four times a day (QID) | ORAL | Status: DC | PRN

## 2019-03-03 MED ORDER — PHENOL 1.4 % MT LIQD: 1.0000 | OROMUCOSAL | Status: DC | PRN

## 2019-03-03 MED ORDER — MENTHOL 3 MG MT LOZG: 1.0000 | LOZENGE | OROMUCOSAL | Status: DC | PRN

## 2019-03-03 MED ORDER — FENTANYL CITRATE (PF) 100 MCG/2ML IJ SOLN
INTRAMUSCULAR | Status: DC | PRN
  Administered 2019-03-03: 25 ug via INTRAVENOUS
  Administered 2019-03-03 (×5): 50 ug via INTRAVENOUS
  Administered 2019-03-03: 25 ug via INTRAVENOUS
  Administered 2019-03-03: 50 ug via INTRAVENOUS

## 2019-03-03 MED ORDER — DOCUSATE SODIUM 100 MG PO CAPS
100.0000 mg | ORAL_CAPSULE | Freq: Two times a day (BID) | ORAL | Status: DC
  Administered 2019-03-03: 100 mg via ORAL
  Filled 2019-03-03: qty 1

## 2019-03-03 MED ORDER — ONDANSETRON HCL 4 MG/2ML IJ SOLN
INTRAMUSCULAR | Status: AC
  Filled 2019-03-03: qty 4

## 2019-03-03 MED ORDER — FLEET ENEMA 7-19 GM/118ML RE ENEM: 1.0000 | ENEMA | Freq: Once | RECTAL | Status: DC | PRN

## 2019-03-03 MED ORDER — LOSARTAN POTASSIUM 25 MG PO TABS
25.0000 mg | ORAL_TABLET | Freq: Every day | ORAL | Status: DC
  Administered 2019-03-03: 25 mg via ORAL
  Filled 2019-03-03 (×2): qty 1

## 2019-03-03 MED ORDER — ONDANSETRON HCL 4 MG/2ML IJ SOLN: 4.0000 mg | Freq: Four times a day (QID) | INTRAMUSCULAR | Status: DC | PRN

## 2019-03-03 MED ORDER — MIDAZOLAM HCL 2 MG/2ML IJ SOLN
INTRAMUSCULAR | Status: DC | PRN
  Administered 2019-03-03: 2 mg via INTRAVENOUS

## 2019-03-03 MED ORDER — SUGAMMADEX SODIUM 200 MG/2ML IV SOLN
INTRAVENOUS | Status: DC | PRN
  Administered 2019-03-03: 200 mg via INTRAVENOUS

## 2019-03-03 MED ORDER — FENTANYL CITRATE (PF) 250 MCG/5ML IJ SOLN
INTRAMUSCULAR | Status: AC
  Filled 2019-03-03: qty 5

## 2019-03-03 MED ORDER — ASPIRIN EC 81 MG PO TBEC
81.0000 mg | DELAYED_RELEASE_TABLET | Freq: Every day | ORAL | Status: DC
  Administered 2019-03-03: 81 mg via ORAL
  Filled 2019-03-03: qty 1

## 2019-03-03 MED ORDER — PROMETHAZINE HCL 25 MG/ML IJ SOLN: 6.2500 mg | INTRAMUSCULAR | Status: DC | PRN

## 2019-03-03 MED ORDER — MORPHINE SULFATE (PF) 2 MG/ML IV SOLN: 2.0000 mg | INTRAVENOUS | Status: DC | PRN

## 2019-03-03 MED ORDER — SODIUM CHLORIDE 0.9% FLUSH: 3.0000 mL | Freq: Two times a day (BID) | INTRAVENOUS | Status: DC

## 2019-03-03 MED ORDER — ROCURONIUM BROMIDE 10 MG/ML (PF) SYRINGE
PREFILLED_SYRINGE | INTRAVENOUS | Status: AC
  Filled 2019-03-03: qty 10

## 2019-03-03 MED ORDER — ONDANSETRON HCL 4 MG/2ML IJ SOLN
INTRAMUSCULAR | Status: DC | PRN
  Administered 2019-03-03: 4 mg via INTRAVENOUS

## 2019-03-03 MED ORDER — ADULT MULTIVITAMIN W/MINERALS CH: ORAL_TABLET | Freq: Every day | ORAL | Status: DC

## 2019-03-03 MED ORDER — ZOLPIDEM TARTRATE 5 MG PO TABS: 5.0000 mg | ORAL_TABLET | Freq: Every evening | ORAL | Status: DC | PRN

## 2019-03-03 MED ORDER — SODIUM CHLORIDE 0.9% FLUSH: 3.0000 mL | INTRAVENOUS | Status: DC | PRN

## 2019-03-03 MED ORDER — HYDROCODONE-ACETAMINOPHEN 5-325 MG PO TABS
1.0000 | ORAL_TABLET | Freq: Four times a day (QID) | ORAL | Status: DC | PRN
  Administered 2019-03-03: 17:00:00 1 via ORAL

## 2019-03-03 MED ORDER — LACTATED RINGERS IV SOLN
INTRAVENOUS | Status: DC
  Administered 2019-03-03 (×2): via INTRAVENOUS

## 2019-03-03 MED ORDER — VITAMIN E 180 MG (400 UNIT) PO CAPS
400.0000 [IU] | ORAL_CAPSULE | Freq: Every day | ORAL | Status: DC
  Filled 2019-03-03: qty 1

## 2019-03-03 MED ORDER — THROMBIN 5000 UNITS EX SOLR
CUTANEOUS | Status: AC
  Filled 2019-03-03: qty 5000

## 2019-03-03 MED ORDER — BISACODYL 10 MG RE SUPP: 10.0000 mg | Freq: Every day | RECTAL | Status: DC | PRN

## 2019-03-03 MED ORDER — MIDAZOLAM HCL 2 MG/2ML IJ SOLN
INTRAMUSCULAR | Status: AC
  Filled 2019-03-03: qty 2

## 2019-03-03 MED ORDER — THROMBIN 20000 UNITS EX SOLR
CUTANEOUS | Status: AC
  Filled 2019-03-03: qty 20000

## 2019-03-03 MED ORDER — FIBER PO CHEW: 2.0000 | CHEWABLE_TABLET | Freq: Every morning | ORAL | Status: DC

## 2019-03-03 MED ORDER — PROPOFOL 10 MG/ML IV BOLUS
INTRAVENOUS | Status: AC
  Filled 2019-03-03: qty 20

## 2019-03-03 MED ORDER — DEXAMETHASONE SODIUM PHOSPHATE 10 MG/ML IJ SOLN
INTRAMUSCULAR | Status: AC
  Filled 2019-03-03: qty 1

## 2019-03-03 MED ORDER — 0.9 % SODIUM CHLORIDE (POUR BTL) OPTIME
TOPICAL | Status: DC | PRN
  Administered 2019-03-03: 1000 mL

## 2019-03-03 MED ORDER — PROPOFOL 10 MG/ML IV BOLUS
INTRAVENOUS | Status: DC | PRN
  Administered 2019-03-03: 100 mg via INTRAVENOUS

## 2019-03-03 MED ORDER — LIDOCAINE 2% (20 MG/ML) 5 ML SYRINGE
INTRAMUSCULAR | Status: DC | PRN
  Administered 2019-03-03: 30 mg via INTRAVENOUS

## 2019-03-03 MED ORDER — MEPERIDINE HCL 25 MG/ML IJ SOLN: 6.2500 mg | INTRAMUSCULAR | Status: DC | PRN

## 2019-03-03 MED ORDER — HYDROMORPHONE HCL 1 MG/ML IJ SOLN
0.2500 mg | INTRAMUSCULAR | Status: DC | PRN
  Administered 2019-03-03: 16:00:00 0.5 mg via INTRAVENOUS

## 2019-03-03 MED ORDER — BUPIVACAINE HCL (PF) 0.5 % IJ SOLN
INTRAMUSCULAR | Status: AC
  Filled 2019-03-03: qty 30

## 2019-03-03 MED ORDER — LIDOCAINE 2% (20 MG/ML) 5 ML SYRINGE
INTRAMUSCULAR | Status: AC
  Filled 2019-03-03: qty 5

## 2019-03-03 MED ORDER — BUPIVACAINE LIPOSOME 1.3 % IJ SUSP
20.0000 mL | INTRAMUSCULAR | Status: AC
  Administered 2019-03-03: 20 mL
  Filled 2019-03-03: qty 20

## 2019-03-03 MED ORDER — CHLORHEXIDINE GLUCONATE CLOTH 2 % EX PADS: 6.0000 | MEDICATED_PAD | Freq: Once | CUTANEOUS | Status: DC

## 2019-03-03 MED ORDER — ROCURONIUM BROMIDE 50 MG/5ML IV SOSY
PREFILLED_SYRINGE | INTRAVENOUS | Status: DC | PRN
  Administered 2019-03-03: 60 mg via INTRAVENOUS

## 2019-03-03 MED ORDER — THROMBIN 20000 UNITS EX SOLR
CUTANEOUS | Status: DC | PRN
  Administered 2019-03-03: 20 mL via TOPICAL

## 2019-03-03 MED ORDER — CEFAZOLIN SODIUM-DEXTROSE 2-4 GM/100ML-% IV SOLN
INTRAVENOUS | Status: AC
  Filled 2019-03-03: qty 100

## 2019-03-03 MED ORDER — METHOCARBAMOL 1000 MG/10ML IJ SOLN
500.0000 mg | Freq: Four times a day (QID) | INTRAVENOUS | Status: DC | PRN
  Filled 2019-03-03: qty 5

## 2019-03-03 MED ORDER — CALCIUM POLYCARBOPHIL 625 MG PO TABS
625.0000 mg | ORAL_TABLET | Freq: Every day | ORAL | Status: DC
  Filled 2019-03-03: qty 1

## 2019-03-03 MED ORDER — HYDROCODONE-ACETAMINOPHEN 5-325 MG PO TABS
ORAL_TABLET | ORAL | Status: AC
  Administered 2019-03-03: 1 via ORAL
  Filled 2019-03-03: qty 1

## 2019-03-03 MED ORDER — PANTOPRAZOLE SODIUM 40 MG PO TBEC
40.0000 mg | DELAYED_RELEASE_TABLET | Freq: Every day | ORAL | Status: DC
  Administered 2019-03-03: 40 mg via ORAL
  Filled 2019-03-03: qty 1

## 2019-03-03 MED ORDER — METHOCARBAMOL 500 MG PO TABS
ORAL_TABLET | ORAL | Status: AC
  Administered 2019-03-03: 500 mg via ORAL
  Filled 2019-03-03: qty 1

## 2019-03-03 MED ORDER — PANTOPRAZOLE SODIUM 40 MG IV SOLR: 40.0000 mg | Freq: Every day | INTRAVENOUS | Status: DC

## 2019-03-03 MED ORDER — METHOCARBAMOL 500 MG PO TABS
500.0000 mg | ORAL_TABLET | Freq: Four times a day (QID) | ORAL | Status: DC | PRN
  Administered 2019-03-03 – 2019-03-04 (×3): 500 mg via ORAL
  Filled 2019-03-03 (×2): qty 1

## 2019-03-03 MED ORDER — POLYETHYLENE GLYCOL 3350 17 G PO PACK: 17.0000 g | PACK | Freq: Every day | ORAL | Status: DC | PRN

## 2019-03-03 MED ORDER — ACETAMINOPHEN 325 MG PO TABS: 650.0000 mg | ORAL_TABLET | ORAL | Status: DC | PRN

## 2019-03-03 MED ORDER — ONDANSETRON HCL 4 MG PO TABS: 4.0000 mg | ORAL_TABLET | Freq: Four times a day (QID) | ORAL | Status: DC | PRN

## 2019-03-03 MED ORDER — DEXAMETHASONE SODIUM PHOSPHATE 10 MG/ML IJ SOLN
INTRAMUSCULAR | Status: DC | PRN
  Administered 2019-03-03: 10 mg via INTRAVENOUS

## 2019-03-03 SURGICAL SUPPLY — 87 items
ADH SKN CLS APL DERMABOND .7 (GAUZE/BANDAGES/DRESSINGS) ×1
BASKET BONE COLLECTION (BASKET) ×3 IMPLANT
BLADE CLIPPER SURG (BLADE) IMPLANT
BONE CANC CHIPS 20CC PCAN1/4 (Bone Implant) ×3 IMPLANT
BUR MATCHSTICK NEURO 3.0 LAGG (BURR) ×3 IMPLANT
BUR PRECISION FLUTE 5.0 (BURR) ×3 IMPLANT
CANISTER SUCT 3000ML PPV (MISCELLANEOUS) ×3 IMPLANT
CARTRIDGE OIL MAESTRO DRILL (MISCELLANEOUS) ×1 IMPLANT
CHIPS CANC BONE 20CC PCAN1/4 (Bone Implant) ×1 IMPLANT
CONT SPEC 4OZ CLIKSEAL STRL BL (MISCELLANEOUS) ×7 IMPLANT
COVER BACK TABLE 24X17X13 BIG (DRAPES) IMPLANT
COVER BACK TABLE 60X90IN (DRAPES) ×3 IMPLANT
COVER WAND RF STERILE (DRAPES) ×3 IMPLANT
DECANTER SPIKE VIAL GLASS SM (MISCELLANEOUS) ×3 IMPLANT
DERMABOND ADVANCED (GAUZE/BANDAGES/DRESSINGS) ×2
DERMABOND ADVANCED .7 DNX12 (GAUZE/BANDAGES/DRESSINGS) ×1 IMPLANT
DIFFUSER DRILL AIR PNEUMATIC (MISCELLANEOUS) ×3 IMPLANT
DRAPE C-ARM 42X72 X-RAY (DRAPES) ×3 IMPLANT
DRAPE C-ARMOR (DRAPES) ×3 IMPLANT
DRAPE LAPAROTOMY 100X72X124 (DRAPES) ×3 IMPLANT
DRAPE MICROSCOPE LEICA (MISCELLANEOUS) ×2 IMPLANT
DRAPE POUCH INSTRU U-SHP 10X18 (DRAPES) ×3 IMPLANT
DRAPE SURG 17X23 STRL (DRAPES) ×3 IMPLANT
DRSG OPSITE POSTOP 4X6 (GAUZE/BANDAGES/DRESSINGS) ×2 IMPLANT
DURAPREP 26ML APPLICATOR (WOUND CARE) ×3 IMPLANT
ELECT BLADE 4.0 EZ CLEAN MEGAD (MISCELLANEOUS) ×3
ELECT REM PT RETURN 9FT ADLT (ELECTROSURGICAL) ×3
ELECTRODE BLDE 4.0 EZ CLN MEGD (MISCELLANEOUS) IMPLANT
ELECTRODE REM PT RTRN 9FT ADLT (ELECTROSURGICAL) ×1 IMPLANT
GAUZE 4X4 16PLY RFD (DISPOSABLE) IMPLANT
GAUZE SPONGE 4X4 12PLY STRL (GAUZE/BANDAGES/DRESSINGS) ×3 IMPLANT
GLOVE BIO SURGEON STRL SZ8 (GLOVE) ×6 IMPLANT
GLOVE BIOGEL PI IND STRL 6 (GLOVE) IMPLANT
GLOVE BIOGEL PI IND STRL 8 (GLOVE) ×2 IMPLANT
GLOVE BIOGEL PI IND STRL 8.5 (GLOVE) ×2 IMPLANT
GLOVE BIOGEL PI INDICATOR 6 (GLOVE) ×2
GLOVE BIOGEL PI INDICATOR 8 (GLOVE) ×4
GLOVE BIOGEL PI INDICATOR 8.5 (GLOVE) ×4
GLOVE ECLIPSE 7.5 STRL STRAW (GLOVE) ×8 IMPLANT
GLOVE ECLIPSE 8.0 STRL XLNG CF (GLOVE) ×8 IMPLANT
GLOVE EXAM NITRILE XL STR (GLOVE) IMPLANT
GLOVE SURG SS PI 6.5 STRL IVOR (GLOVE) ×2 IMPLANT
GOWN STRL REUS W/ TWL LRG LVL3 (GOWN DISPOSABLE) IMPLANT
GOWN STRL REUS W/ TWL XL LVL3 (GOWN DISPOSABLE) ×2 IMPLANT
GOWN STRL REUS W/TWL 2XL LVL3 (GOWN DISPOSABLE) ×10 IMPLANT
GOWN STRL REUS W/TWL LRG LVL3 (GOWN DISPOSABLE) ×3
GOWN STRL REUS W/TWL XL LVL3 (GOWN DISPOSABLE) ×9
GRAFT BNE CANC CHIPS 1-8 20CC (Bone Implant) IMPLANT
HEMOSTAT POWDER SURGIFOAM 1G (HEMOSTASIS) ×2 IMPLANT
IMPL TLX 9X11X25MM 15DEG (Cage) IMPLANT
KIT BASIN OR (CUSTOM PROCEDURE TRAY) ×3 IMPLANT
KIT INFUSE XX SMALL 0.7CC (Orthopedic Implant) ×2 IMPLANT
KIT POSITION SURG JACKSON T1 (MISCELLANEOUS) ×3 IMPLANT
KIT TURNOVER KIT B (KITS) ×3 IMPLANT
MILL MEDIUM DISP (BLADE) ×2 IMPLANT
NDL HYPO 21X1.5 SAFETY (NEEDLE) IMPLANT
NDL HYPO 25X1 1.5 SAFETY (NEEDLE) ×1 IMPLANT
NDL SPNL 18GX3.5 QUINCKE PK (NEEDLE) IMPLANT
NEEDLE HYPO 21X1.5 SAFETY (NEEDLE) ×3 IMPLANT
NEEDLE HYPO 25X1 1.5 SAFETY (NEEDLE) ×3 IMPLANT
NEEDLE SPNL 18GX3.5 QUINCKE PK (NEEDLE) ×3 IMPLANT
NS IRRIG 1000ML POUR BTL (IV SOLUTION) ×3 IMPLANT
OIL CARTRIDGE MAESTRO DRILL (MISCELLANEOUS) ×3
PACK LAMINECTOMY NEURO (CUSTOM PROCEDURE TRAY) ×3 IMPLANT
PAD ARMBOARD 7.5X6 YLW CONV (MISCELLANEOUS) ×9 IMPLANT
PATTIES SURGICAL .5 X.5 (GAUZE/BANDAGES/DRESSINGS) IMPLANT
PATTIES SURGICAL .5 X1 (DISPOSABLE) IMPLANT
PATTIES SURGICAL 1X1 (DISPOSABLE) IMPLANT
ROD RELINE-O LORD 5.5X40 (Rod) ×4 IMPLANT
RUBBERBAND STERILE (MISCELLANEOUS) ×4 IMPLANT
SCREW LOCK RELINE 5.5 TULIP (Screw) ×8 IMPLANT
SCREW RELINE-O POLY 6.5X45 (Screw) ×8 IMPLANT
SPONGE LAP 4X18 RFD (DISPOSABLE) IMPLANT
SPONGE SURGIFOAM ABS GEL 100 (HEMOSTASIS) ×3 IMPLANT
STAPLER SKIN PROX WIDE 3.9 (STAPLE) ×2 IMPLANT
SUT VIC AB 1 CT1 18XBRD ANBCTR (SUTURE) ×2 IMPLANT
SUT VIC AB 1 CT1 8-18 (SUTURE) ×3
SUT VIC AB 2-0 CT1 18 (SUTURE) ×6 IMPLANT
SUT VIC AB 3-0 SH 8-18 (SUTURE) ×6 IMPLANT
SYR 20CC LL (SYRINGE) ×2 IMPLANT
SYR 3ML LL SCALE MARK (SYRINGE) ×2 IMPLANT
SYR 5ML LL (SYRINGE) IMPLANT
TLX IMPLANT 9X11X25MM 15DEG (Cage) ×3 IMPLANT
TOWEL GREEN STERILE (TOWEL DISPOSABLE) ×3 IMPLANT
TOWEL GREEN STERILE FF (TOWEL DISPOSABLE) ×3 IMPLANT
TRAY FOLEY MTR SLVR 16FR STAT (SET/KITS/TRAYS/PACK) ×3 IMPLANT
WATER STERILE IRR 1000ML POUR (IV SOLUTION) ×3 IMPLANT

## 2019-03-03 NOTE — Anesthesia Procedure Notes (Signed)
Procedure Name: Intubation Date/Time: 03/03/2019 12:37 PM Performed by: Genelle Bal, CRNA Pre-anesthesia Checklist: Patient identified, Emergency Drugs available, Suction available and Patient being monitored Patient Re-evaluated:Patient Re-evaluated prior to induction Oxygen Delivery Method: Circle system utilized Preoxygenation: Pre-oxygenation with 100% oxygen Induction Type: IV induction Ventilation: Mask ventilation without difficulty Laryngoscope Size: Miller and 2 Grade View: Grade I Tube type: Oral Tube size: 7.0 mm Number of attempts: 1 Airway Equipment and Method: Stylet and Oral airway Placement Confirmation: ETT inserted through vocal cords under direct vision,  positive ETCO2 and breath sounds checked- equal and bilateral Secured at: 21 cm Tube secured with: Tape Dental Injury: Teeth and Oropharynx as per pre-operative assessment

## 2019-03-03 NOTE — Op Note (Signed)
03/03/2019  3:39 PM  PATIENT:  Laura [name]  63 y.o. female  PRE-OPERATIVE DIAGNOSIS:  Lumbar stenosis without neurogenic claudication, recurrent lumbar disc herniation, disc degeneration, lumbago, radiculopathy L 45 level  POST-OPERATIVE DIAGNOSIS:  Lumbar stenosis without neurogenic claudication, recurrent lumbar disc herniation, disc degeneration, lumbago, radiculopathy L 45 level  PROCEDURE:  Procedure(s): Left Lumbar four-five Redo Microdiscectomy with transforaminal lumbar interbody fusion (Left) with pedicle screw fixation (segmental) with posterolateral arthrodesis with autograft and allograft  SURGEON:  Surgeon(s) and Role:    Erline Levine, MD - Primary    * Earnie Larsson, MD - Assisting  PHYSICIAN ASSISTANT:   ASSISTANTS: Poteat, RN   ANESTHESIA:   general  EBL:  100 mL   BLOOD ADMINISTERED:none  DRAINS: none   LOCAL MEDICATIONS USED:  MARCAINE    and LIDOCAINE   SPECIMEN:  No Specimen  DISPOSITION OF SPECIMEN:  N/A  COUNTS:  YES  TOURNIQUET:  * No tourniquets in log *  DICTATION: Patient is 63 year old woman with prior discectomy x 2  at L4/5 with severe recurrent lumbar stenosis and a large disc herniation.  It was elected to take him to surgery for redo decompression and discectomy and fusion at the  L 4 / 5 level via TLIF approach.   Procedure: Patient was placed in a prone position on the [place] table after smooth and uncomplicated induction of general endotracheal anesthesia. Her low back was prepped and draped in usual sterile fashion with betadine scrub and DuraPrep. Area of incision was infiltrated with local lidocaine. Incision was made to the lumbodorsal fascia was incised and exposure was performed of the L 4 - L 5 transverse processes and facet joints.Confirmation of level was performed with intraoperative X ray. Redo laminectomy with disarticulation of facet joint at  L 45 level on the left.  Under microdissection, the left side of the thecal sac  was cleared of scar and a large amount of herniated disc material was removed from below the L 4 and L 5 nerve roots. After thorough decompression of all neural elements, a thorough discectomy was performed and after trial sizing, a 9 x 11 x26 mm x 15 degree expandable TLX TLIF graft was placed and countersunk appropriately after packing 5 cc autograft in the interspace.  Pedicle screws were placed at L 4, L 5 levels (6.5 x 45 mm at each level).  Intraoperative fluoroscopy confirmed correct orientationin the AP and lateral plane.  Final x-rays demonstrated well-positioned pedicle screw fixation.  40 mm lordotic rods were placed bilaterally and locked down in situ.  Posterolateral region on the right was decorticated and packed with remaining BMP, local autograft, allograft.  Long-acting Marcaine was injected in the soft tissues.  Fascia was closed with 1 Vicryl sutures skin edges were reapproximated 2 and 3-0 Vicryl sutures. The wound is dressed with Dermabond and occlusive dressing.  The patient was extubated in the operating room and taken to recovery in stable satisfactory condition. Counts were correct at the end of the case.   PLAN OF CARE: Admit to inpatient   PATIENT DISPOSITION:  PACU - hemodynamically stable.   Delay start of Pharmacological VTE agent (>24hrs) due to surgical blood loss or risk of bleeding: yes

## 2019-03-03 NOTE — Brief Op Note (Signed)
03/03/2019  3:39 PM  PATIENT:  Laura [name]  63 y.o. female  PRE-OPERATIVE DIAGNOSIS:  Lumbar stenosis without neurogenic claudication, recurrent lumbar disc herniation, disc degeneration, lumbago, radiculopathy L 45 level  POST-OPERATIVE DIAGNOSIS:  Lumbar stenosis without neurogenic claudication, recurrent lumbar disc herniation, disc degeneration, lumbago, radiculopathy L 45 level  PROCEDURE:  Procedure(s): Left Lumbar four-five Redo Microdiscectomy with transforaminal lumbar interbody fusion (Left) with pedicle screw fixation (segmental) with posterolateral arthrodesis with autograft and allograft  SURGEON:  Surgeon(s) and Role:    Erline Levine, MD - Primary    * Earnie Larsson, MD - Assisting  PHYSICIAN ASSISTANT:   ASSISTANTS: Poteat, RN   ANESTHESIA:   general  EBL:  100 mL   BLOOD ADMINISTERED:none  DRAINS: none   LOCAL MEDICATIONS USED:  MARCAINE    and LIDOCAINE   SPECIMEN:  No Specimen  DISPOSITION OF SPECIMEN:  N/A  COUNTS:  YES  TOURNIQUET:  * No tourniquets in log *  DICTATION: Patient is 63 year old woman with prior discectomy x 2  at L4/5 with severe recurrent lumbar stenosis and a large disc herniation.  It was elected to take him to surgery for redo decompression and discectomy and fusion at the  L 4 / 5 level via TLIF approach.   Procedure: Patient was placed in a prone position on the [place] table after smooth and uncomplicated induction of general endotracheal anesthesia. Her low back was prepped and draped in usual sterile fashion with betadine scrub and DuraPrep. Area of incision was infiltrated with local lidocaine. Incision was made to the lumbodorsal fascia was incised and exposure was performed of the L 4 - L 5 transverse processes and facet joints.Confirmation of level was performed with intraoperative X ray. Redo laminectomy with disarticulation of facet joint at  L 45 level on the left.  Under microdissection, the left side of the thecal sac  was cleared of scar and a large amount of herniated disc material was removed from below the L 4 and L 5 nerve roots. After thorough decompression of all neural elements, a thorough discectomy was performed and after trial sizing, a 9 x 11 x26 mm x 15 degree expandable TLX TLIF graft was placed and countersunk appropriately after packing 5 cc autograft in the interspace.  Pedicle screws were placed at L 4, L 5 levels (6.5 x 45 mm at each level).  Intraoperative fluoroscopy confirmed correct orientationin the AP and lateral plane.  Final x-rays demonstrated well-positioned pedicle screw fixation.  40 mm lordotic rods were placed bilaterally and locked down in situ.  Posterolateral region on the right was decorticated and packed with remaining BMP, local autograft, allograft.  Long-acting Marcaine was injected in the soft tissues.  Fascia was closed with 1 Vicryl sutures skin edges were reapproximated 2 and 3-0 Vicryl sutures. The wound is dressed with Dermabond and occlusive dressing.  The patient was extubated in the operating room and taken to recovery in stable satisfactory condition. Counts were correct at the end of the case.   PLAN OF CARE: Admit to inpatient   PATIENT DISPOSITION:  PACU - hemodynamically stable.   Delay start of Pharmacological VTE agent (>24hrs) due to surgical blood loss or risk of bleeding: yes

## 2019-03-03 NOTE — Interval H&P Note (Signed)
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  The various methods of treatment have been discussed with the patient and family. After consideration of risks, benefits and other options for treatment, the patient has consented to  Procedure(s) with comments: Left Lumbar 4-5 Redo Microdiscectomy with transforaminal lumbar interbody fusion (Left) - Left Lumbar 4-5 Redo Microdiscectomy with transforaminal lumbar interbody fusion as a surgical intervention.  The patient's history has been reviewed, patient examined, no change in status, stable for surgery.  I have reviewed the patient's chart and labs.  Questions were answered to the patient's satisfaction.     Peggyann Shoals

## 2019-03-03 NOTE — Anesthesia Preprocedure Evaluation (Addendum)
Anesthesia Evaluation  Patient identified by MRN, date of birth, ID band Patient awake    Reviewed: Allergy & Precautions, NPO status , Patient's Chart, lab work & pertinent test results  History of Anesthesia Complications (+) PONV  Airway Mallampati: I  TM Distance: >3 FB Neck ROM: Full    Dental  (+) Dental Advisory Given   Pulmonary neg pulmonary ROS,  02/28/2019 novel coronavirus NEG   breath sounds clear to auscultation       Cardiovascular Pt. on medications (-) angina Rhythm:Regular Rate:Normal  '11 ECHO: normal LVF, normal valves   Neuro/Psych Chronic back pain    GI/Hepatic Neg liver ROS, GERD  Controlled,  Endo/Other  negative endocrine ROS  Renal/GU negative Renal ROS     Musculoskeletal  (+) Arthritis ,   Abdominal   Peds  Hematology negative hematology ROS (+)   Anesthesia Other Findings   Reproductive/Obstetrics                            Anesthesia Physical Anesthesia Plan  ASA: II  Anesthesia Plan: General   Post-op Pain Management:    Induction:   PONV Risk Score and Plan: 4 or greater and Scopolamine patch - Pre-op, Dexamethasone and Ondansetron  Airway Management Planned: Oral ETT  Additional Equipment:   Intra-op Plan:   Post-operative Plan: Extubation in OR  Informed Consent: I have reviewed the patients History and Physical, chart, labs and discussed the procedure including the risks, benefits and alternatives for the proposed anesthesia with the patient or authorized representative who has indicated his/her understanding and acceptance.     Dental advisory given  Plan Discussed with: CRNA and Surgeon  Anesthesia Plan Comments:        Anesthesia Quick Evaluation

## 2019-03-03 NOTE — Transfer of Care (Signed)
Immediate Anesthesia Transfer of Care Note  Patient: Laura [name]  Procedure(s) Performed: Left Lumbar four-five Redo Microdiscectomy with transforaminal lumbar interbody fusion (Left Back)  Patient Location: PACU  Anesthesia Type:General  Level of Consciousness: drowsy and patient cooperative  Airway & Oxygen Therapy: Patient Spontanous Breathing and Patient connected to face mask oxygen  Post-op Assessment: Report given to RN and Post -op Vital signs reviewed and stable  Post vital signs: Reviewed and stable  Last Vitals:  Vitals Value Taken Time  BP 161/76 03/03/19 1512  Temp    Pulse 98 03/03/19 1513  Resp 19 03/03/19 1513  SpO2 100 % 03/03/19 1513  Vitals shown include unvalidated device data.  Last Pain:  Vitals:   03/03/19 1016  TempSrc:   PainSc: 5       Patients Stated Pain Goal: 6 (09/23/01 4961)  Complications: No apparent anesthesia complications

## 2019-03-03 NOTE — Anesthesia Postprocedure Evaluation (Signed)
Anesthesia Post Note  Patient: ADAYA GARMANY  Procedure(s) Performed: Left Lumbar four-five Redo Microdiscectomy with transforaminal lumbar interbody fusion (Left Back)     Patient location during evaluation: PACU Anesthesia Type: General Level of consciousness: awake and alert, patient cooperative and oriented Pain management: pain level controlled (pt says better now) Vital Signs Assessment: post-procedure vital signs reviewed and stable Respiratory status: spontaneous breathing, nonlabored ventilation and respiratory function stable Cardiovascular status: blood pressure returned to baseline and stable Postop Assessment: no apparent nausea or vomiting Anesthetic complications: no    Last Vitals:  Vitals:   03/03/19 1600 03/03/19 1615  BP: (!) 147/75 132/74  Pulse: 78 87  Resp: 13 17  Temp:    SpO2: 100% 100%    Last Pain:  Vitals:   03/03/19 1630  TempSrc:   PainSc: 8                  Kvon Mcilhenny,E. Carlyle Mcelrath

## 2019-03-04 MED ORDER — HYDROCODONE-ACETAMINOPHEN 5-325 MG PO TABS: 1.0000 | ORAL_TABLET | ORAL | 0 refills | Status: DC | PRN

## 2019-03-04 MED ORDER — METHOCARBAMOL 500 MG PO TABS: 500.0000 mg | ORAL_TABLET | Freq: Four times a day (QID) | ORAL | 3 refills | Status: DC | PRN

## 2019-03-04 NOTE — Discharge Instructions (Signed)

## 2019-03-04 NOTE — Discharge Summary (Signed)
Physician Discharge Summary  Patient ID: Laura [name] MRN: [id number] DOB/AGE: January 09, 1956 63 y.o.  Admit date: 03/03/2019 Discharge date: 03/04/2019  Admission Diagnoses: Herniated nucleus pulposus L4-L5, recurrent x2 with lumbar radiculopathy  Discharge Diagnoses: Herniated nucleus pulposus L4-L5 recurrent x2 with lumbar radiculopathy Active Problems:   Herniated lumbar disc without myelopathy   Discharged Condition: good  Hospital Course: Patient was admitted to undergo surgical decompression and arthrodesis at L4-L5 which she tolerated well.  Consults: None  Significant Diagnostic Studies: None  Treatments: Posterior lateral decompression L4-L5 with T lif interbody arthrodesis and percutaneous pedicle screw fixation  Discharge Exam: Blood pressure 128/75, pulse 68, temperature 97.9 F (36.6 C), temperature source Oral, resp. rate 18, SpO2 99 %. Incision is clean and dry Station and gait are intact  Disposition: Discharge disposition: 01-Home or Self Care       Discharge Instructions    Call MD for:  redness, tenderness, or signs of infection (pain, swelling, redness, odor or green/yellow discharge around incision site)   Complete by: As directed    Call MD for:  severe uncontrolled pain   Complete by: As directed    Call MD for:  temperature >100.4   Complete by: As directed    Diet - low sodium heart healthy   Complete by: As directed    Increase activity slowly   Complete by: As directed      Allergies as of 03/04/2019   No Known Allergies     Medication List    TAKE these medications   aspirin EC 81 MG tablet Take 81 mg by mouth daily.   CALTRATE 600+D PO Take 1 tablet by mouth 2 (two) times a day.   Fiber Chew Chew 2 tablets by mouth every morning.   fluticasone 50 MCG/ACT nasal spray Commonly known as: FLONASE Place 1 spray into both nostrils daily as needed for allergies.   HYDROcodone-acetaminophen 5-325 MG tablet Commonly known as:  NORCO/VICODIN Take 1-2 tablets by mouth every 4 (four) hours as needed for moderate pain or severe pain ((score 7 to 10)). What changed:   how much to take  when to take this  reasons to take this   losartan 25 MG tablet Commonly known as: COZAAR TAKE 1 TABLET (25 MG TOTAL) BY MOUTH DAILY.   methocarbamol 500 MG tablet Commonly known as: ROBAXIN Take 1 tablet (500 mg total) by mouth every 6 (six) hours as needed for muscle spasms.   multivitamin tablet Take 1 tablet by mouth daily.   vitamin E 400 UNIT capsule Take 400 Units by mouth daily.        Signed: Blanchie Dessert Golden Emile 03/04/2019, 9:50 AM

## 2019-03-04 NOTE — Evaluation (Signed)
Physical Therapy Evaluation Patient Details Name: Laura [name] MRN: [id number] DOB: August 16, 1956 Today's Date: 03/04/2019   History of Present Illness  Pt is a 63 y/o female s/p TLIF L4-5 with redo diskectomy on the left. PMH: asthma, HTN.   Clinical Impression  Patient presents with pain and post surgical deficits s/p above surgery. Pt independent PTA and helps to care for her elderly mother and mother in law. Has support of spouse at home. Pt tolerated transfers, gait training and stair training with supervision for safety. Education re: back precautions, brace, positioning, log roll technique and walking program. Will follow acutely to maximize independence and mobility prior to return home.     Follow Up Recommendations No PT follow up;Supervision - Intermittent    Equipment Recommendations  None recommended by PT    Recommendations for Other Services       Precautions / Restrictions Precautions Precautions: Back;Fall Precaution Booklet Issued: Yes (comment) Precaution Comments: reviewed precautions with pt  Required Braces or Orthoses: Spinal Brace Spinal Brace: Lumbar corset Restrictions Weight Bearing Restrictions: No      Mobility  Bed Mobility               General bed mobility comments: Sitting EOB upon PT arrival.  Transfers Overall transfer level: Needs assistance Equipment used: None Transfers: Sit to/from Stand Sit to Stand: Supervision         General transfer comment: Supervision for safety. Stood from [employer].  Ambulation/Gait Ambulation/Gait assistance: Supervision Gait Distance (Feet): 350 Feet Assistive device: None Gait Pattern/deviations: Step-through pattern;Decreased stride length Gait velocity: decreased   General Gait Details: Slow, guarded gait with good upright posture. No LOB.  Stairs Stairs: Yes Stairs assistance: Supervision Stair Management: One rail Right;Step to pattern Number of Stairs: 3 General stair comments:  Cues for technique and safety.  Wheelchair Mobility    Modified Rankin (Stroke Patients Only)       Balance Overall balance assessment: Mild deficits observed, not formally tested                                           Pertinent Vitals/Pain Pain Assessment: 0-10 Pain Score: 4  Faces Pain Scale: Hurts a little bit Pain Location: back-incisional Pain Descriptors / Indicators: Discomfort;Operative site guarding Pain Intervention(s): Repositioned;Premedicated before session;Monitored during session    [place] expects to be discharged to:: Private residence Living Arrangements: Spouse/significant other;Children Available Help at Discharge: Family;Available 24 hours/day Type of Home: House Home Access: Stairs to enter Entrance Stairs-Rails: None Entrance Stairs-Number of Steps: 2 Home Layout: One level Home Equipment: Cane - single point;Other (comment)      Prior Function Level of Independence: Independent         Comments: Helps care for her elderly mother and mother in law; loves to travel. Has been retired since 2015.     Hand Dominance        Extremity/Trunk Assessment   Upper Extremity Assessment Upper Extremity Assessment: Defer to OT evaluation    Lower Extremity Assessment Lower Extremity Assessment: Overall WFL for tasks assessed    Cervical / Trunk Assessment Cervical / Trunk Assessment: Other exceptions Cervical / Trunk Exceptions: s/p spinal sx  Communication   Communication: No difficulties  Cognition Arousal/Alertness: Awake/alert Behavior During Therapy: WFL for tasks assessed/performed Overall Cognitive Status: Within Functional Limits for tasks assessed  General Comments      Exercises     Assessment/Plan    PT Assessment Patient needs continued PT services  PT Problem List Decreased mobility;Decreased knowledge of  precautions;Pain;Decreased balance       PT Treatment Interventions Therapeutic activities;Gait training;Therapeutic exercise;Patient/family education;Balance training;Functional mobility training;Stair training    PT Goals (Current goals can be found in the Care Plan section)  Acute Rehab PT Goals Patient Stated Goal: "to be able to travel anywhere" PT Goal Formulation: With patient Time For Goal Achievement: 03/18/19 Potential to Achieve Goals: Good    Frequency Min 5X/week   Barriers to discharge        Co-evaluation               AM-PAC PT "6 Clicks" Mobility  Outcome Measure Help needed turning from your back to your side while in a flat bed without using bedrails?: None Help needed moving from lying on your back to sitting on the side of a flat bed without using bedrails?: None Help needed moving to and from a bed to a chair (including a wheelchair)?: None Help needed standing up from a chair using your arms (e.g., wheelchair or bedside chair)?: None Help needed to walk in hospital room?: A Little Help needed climbing 3-5 steps with a railing? : A Little 6 Click Score: 22    End of Session Equipment Utilized During Treatment: Gait belt;Back brace Activity Tolerance: Patient tolerated treatment well Patient left: in bed;with call bell/phone within reach(sitting EOB waiting for breakfast) Nurse Communication: Mobility status PT Visit Diagnosis: Pain;Unsteadiness on feet (R26.81) Pain - part of body: (back)    Time: 9774-1423 PT Time Calculation (min) (ACUTE ONLY): 15 min   Charges:   PT Evaluation $PT Eval Low Complexity: 1 Low          Wray Kearns, PT, DPT Acute Rehabilitation Services Pager [phone] Office [phone]      Marguarite Arbour A Sabra Heck 03/04/2019, 9:36 AM

## 2019-03-04 NOTE — Evaluation (Signed)
Occupational Therapy Evaluation and Discharge Patient Details Name: Laura [name] MRN: [id number] DOB: [birth year] Today's Date: 03/04/2019    History of Present Illness Pt is a 63 y/o female s/p TLIF L4-5 with redo diskectomy on the left. PMH: asthma, HTN.    Clinical Impression   PTA patient independent. Admitted for above and limited by problem list below, including back precautions, pain.  Patient educated on back precautions, brace mgmt and wearing schedule, ADL compensatory techniques, DME, AE, recommendations and safety. Reports she will have 24/7 support from spouse as needed, and will plan on borrowing a [place] from her friend. She is able to complete LB ADls with supervision, UB ADLs with supervision, grooming at sink with supervision and transfer with supervision.  Adhering to back precautions without cueing during self care and mobility, only cued to maintain wearing brace during ADLs at sink.  Based on performance today, no further OT needs have been identified and OT is signing off. Thank you for this referral.     Follow Up Recommendations  No OT follow up;Supervision - Intermittent    Equipment Recommendations  None recommended by OT    Recommendations for Other Services PT consult     Precautions / Restrictions Precautions Precautions: Back;Fall Precaution Booklet Issued: Yes (comment) Precaution Comments: reviewed precautions with pt  Required Braces or Orthoses: Spinal Brace Spinal Brace: Lumbar corset Restrictions Weight Bearing Restrictions: No      Mobility Bed Mobility               General bed mobility comments: EOB upon entry   Transfers Overall transfer level: Needs assistance Equipment used: None Transfers: Sit to/from Stand Sit to Stand: Supervision         General transfer comment: no assist required, good posture     Balance Overall balance assessment: Mild deficits observed, not formally tested                                          ADL either performed or assessed with clinical judgement   ADL Overall ADL's : Needs assistance/impaired     Grooming: Supervision/safety;Standing Grooming Details (indicate cue type and reason): cueing to keep spinal brace on standing at sink, automatically removed it when completing grooming tasks  Upper Body Bathing: Set up;Supervision/ safety;Sitting   Lower Body Bathing: Supervison/ safety;Set up;Sit to/from stand Lower Body Bathing Details (indicate cue type and reason): reviewed compensatory techniques and bathing seated for safety Upper Body Dressing : Set up;Sitting   Lower Body Dressing: Supervision/safety;Set up;Sit to/from stand;Cueing for back precautions;Cueing for compensatory techniques Lower Body Dressing Details (indicate cue type and reason): pt able to complete figure 4 technique without assist, reviewed compensatory techniques for safety; supervision sit<>stand; reports spouse will assist as needed Toilet Transfer: Supervision/safety;Ambulation Toilet Transfer Details (indicate cue type and reason): simulated in room   Toileting - Clothing Manipulation Details (indicate cue type and reason): reviewed compensatory techniques    Tub/Shower Transfer Details (indicate cue type and reason): reviewed techniques using walk in shower and [place]; pt reports comfortable with transfer and declined need to practice Functional mobility during ADLs: Supervision/safety General ADL Comments: reviewed back precautions and ADL compesantory techniques     Vision         Perception     Praxis      Pertinent Vitals/Pain Pain Assessment: Faces Faces Pain Scale: Hurts a little bit  Pain Location: back-incisional Pain Descriptors / Indicators: Discomfort;Operative site guarding Pain Intervention(s): Monitored during session;Repositioned     Hand Dominance     Extremity/Trunk Assessment Upper Extremity Assessment Upper Extremity Assessment: Overall WFL for  tasks assessed   Lower Extremity Assessment Lower Extremity Assessment: Defer to PT evaluation   Cervical / Trunk Assessment Cervical / Trunk Assessment: Other exceptions Cervical / Trunk Exceptions: s/p spinal sx   Communication Communication Communication: No difficulties   Cognition Arousal/Alertness: Awake/alert Behavior During Therapy: WFL for tasks assessed/performed Overall Cognitive Status: Within Functional Limits for tasks assessed                                     General Comments       Exercises     Shoulder Instructions      Home Living Family/patient expects to be discharged to:: Private residence Living Arrangements: Spouse/significant other;Children Available Help at Discharge: Family;Available 24 hours/day Type of Home: House             Bathroom Shower/Tub: Walk-in shower   Bathroom Toilet: Handicapped height     Home Equipment: [place] - single point;Other (comment)(will plan on borrowing [place] )          Prior Functioning/Environment Level of Independence: Independent                 OT Problem List: Decreased activity tolerance;Decreased knowledge of use of DME or AE;Decreased knowledge of precautions;Pain      OT Treatment/Interventions:      OT Goals(Current goals can be found in the care plan section) Acute Rehab OT Goals Patient Stated Goal: to get better OT Goal Formulation: With patient  OT Frequency:     Barriers to D/C:            Co-evaluation              AM-PAC OT "6 Clicks" Daily Activity     Outcome Measure Help from another person eating meals?: None Help from another person taking care of personal grooming?: None Help from another person toileting, which includes using toliet, bedpan, or urinal?: A Little Help from another person bathing (including washing, rinsing, drying)?: None Help from another person to put on and taking off regular upper body clothing?: None Help from another  person to put on and taking off regular lower body clothing?: None 6 Click Score: 23   End of Session Equipment Utilized During Treatment: Back brace Nurse Communication: Mobility status  Activity Tolerance: Patient tolerated treatment well Patient left: with call bell/phone within reach;Other (comment)(seated EOB)  OT Visit Diagnosis: Other abnormalities of gait and mobility (R26.89);Pain Pain - part of body: (back-incisonal)                Time: 1950-9326 OT Time Calculation (min): 17 min Charges:  OT General Charges $OT Visit: 1 Visit OT Evaluation $OT Eval Low Complexity: [place], OT Acute Rehabilitation Services Pager [phone] Office [phone]   Delight Stare 03/04/2019, 8:41 AM

## 2019-03-04 NOTE — Plan of Care (Signed)
Patient alert and oriented, mae's well, voiding adequate amount of urine, swallowing without difficulty, no c/o pain at time of discharge. Patient discharged home with family. Script and discharged instructions given to patient. Patient and family stated understanding of instructions given. Patient has an appointment with Dr.Stern    

## 2019-03-06 ENCOUNTER — Encounter (HOSPITAL_COMMUNITY): Payer: Self-pay | Admitting: Neurosurgery

## 2019-05-24 ENCOUNTER — Other Ambulatory Visit: Payer: Self-pay | Admitting: Obstetrics and Gynecology

## 2019-05-24 DIAGNOSIS — Z1231 Encounter for screening mammogram for malignant neoplasm of breast: Secondary | ICD-10-CM

## 2019-07-06 ENCOUNTER — Other Ambulatory Visit: Payer: Self-pay

## 2019-07-06 ENCOUNTER — Ambulatory Visit: Payer: BC Managed Care – PPO

## 2019-07-06 ENCOUNTER — Ambulatory Visit
Admission: RE | Admit: 2019-07-06 | Discharge: 2019-07-06 | Disposition: A | Discharge status: Home / Self Care | Payer: BC Managed Care – PPO | Source: Ambulatory Visit | Attending: Obstetrics and Gynecology | Admitting: Obstetrics and Gynecology

## 2019-07-06 DIAGNOSIS — Z1231 Encounter for screening mammogram for malignant neoplasm of breast: Secondary | ICD-10-CM

## 2019-07-10 ENCOUNTER — Other Ambulatory Visit: Payer: Self-pay | Admitting: Family Medicine

## 2019-07-10 ENCOUNTER — Other Ambulatory Visit: Payer: Self-pay | Admitting: Obstetrics and Gynecology

## 2019-07-10 DIAGNOSIS — R928 Other abnormal and inconclusive findings on diagnostic imaging of breast: Secondary | ICD-10-CM

## 2019-07-11 ENCOUNTER — Other Ambulatory Visit: Payer: Self-pay | Admitting: Obstetrics and Gynecology

## 2019-07-11 ENCOUNTER — Other Ambulatory Visit: Payer: Self-pay | Admitting: Family Medicine

## 2019-07-11 DIAGNOSIS — R928 Other abnormal and inconclusive findings on diagnostic imaging of breast: Secondary | ICD-10-CM

## 2019-07-14 ENCOUNTER — Ambulatory Visit
Admission: RE | Admit: 2019-07-14 | Discharge: 2019-07-14 | Disposition: A | Discharge status: Home / Self Care | Payer: BC Managed Care – PPO | Source: Ambulatory Visit | Attending: Obstetrics and Gynecology | Admitting: Obstetrics and Gynecology

## 2019-07-14 ENCOUNTER — Other Ambulatory Visit: Payer: Self-pay

## 2019-07-14 DIAGNOSIS — R928 Other abnormal and inconclusive findings on diagnostic imaging of breast: Secondary | ICD-10-CM

## 2019-07-31 ENCOUNTER — Encounter: Payer: Self-pay | Admitting: Family Medicine

## 2019-07-31 ENCOUNTER — Ambulatory Visit (INDEPENDENT_AMBULATORY_CARE_PROVIDER_SITE_OTHER): Payer: BC Managed Care – PPO | Admitting: Family Medicine

## 2019-07-31 ENCOUNTER — Other Ambulatory Visit: Payer: Self-pay

## 2019-07-31 VITALS — BP 122/78 | HR 76 | Temp 97.0°F | Ht 65.75 in | Wt 177.1 lb

## 2019-07-31 DIAGNOSIS — Z23 Encounter for immunization: Secondary | ICD-10-CM

## 2019-07-31 DIAGNOSIS — Z0000 Encounter for general adult medical examination without abnormal findings: Secondary | ICD-10-CM | POA: Diagnosis not present

## 2019-07-31 LAB — CBC WITH DIFFERENTIAL/PLATELET
Basophils Absolute: 0 10*3/uL (ref 0.0–0.1)
Basophils Relative: 0.6 % (ref 0.0–3.0)
Eosinophils Absolute: 0.1 10*3/uL (ref 0.0–0.7)
Eosinophils Relative: 1.2 % (ref 0.0–5.0)
HCT: 43.3 % (ref 36.0–46.0)
Hemoglobin: 14.5 g/dL (ref 12.0–15.0)
Lymphocytes Relative: 46.8 % — ABNORMAL HIGH (ref 12.0–46.0)
Lymphs Abs: 3.1 10*3/uL (ref 0.7–4.0)
MCHC: 33.4 g/dL (ref 30.0–36.0)
MCV: 88.5 fl (ref 78.0–100.0)
Monocytes Absolute: 0.4 10*3/uL (ref 0.1–1.0)
Monocytes Relative: 5.7 % (ref 3.0–12.0)
Neutro Abs: 3.1 10*3/uL (ref 1.4–7.7)
Neutrophils Relative %: 45.7 % (ref 43.0–77.0)
Platelets: 298 10*3/uL (ref 150.0–400.0)
RBC: 4.89 Mil/uL (ref 3.87–5.11)
RDW: 13.4 % (ref 11.5–15.5)
WBC: 6.7 10*3/uL (ref 4.0–10.5)

## 2019-07-31 LAB — BASIC METABOLIC PANEL
BUN: 13 mg/dL (ref 6–23)
CO2: 26 mEq/L (ref 19–32)
Calcium: 9.6 mg/dL (ref 8.4–10.5)
Chloride: 105 mEq/L (ref 96–112)
Creatinine, Ser: 0.6 mg/dL (ref 0.40–1.20)
GFR: 101.06 mL/min (ref >60.00)
Glucose, Bld: 109 mg/dL — ABNORMAL HIGH (ref 70–99)
Potassium: 3.9 mEq/L (ref 3.5–5.1)
Sodium: 140 mEq/L (ref 135–145)

## 2019-07-31 LAB — LDL CHOLESTEROL, DIRECT: Direct LDL: 82 mg/dL

## 2019-07-31 LAB — HEPATIC FUNCTION PANEL
ALT: 23 U/L (ref 0–35)
AST: 20 U/L (ref 0–37)
Albumin: 4.7 g/dL (ref 3.5–5.2)
Alkaline Phosphatase: 103 U/L (ref 39–117)
Bilirubin, Direct: 0.1 mg/dL (ref 0.0–0.3)
Total Bilirubin: 0.5 mg/dL (ref 0.2–1.2)
Total Protein: 7.5 g/dL (ref 6.0–8.3)

## 2019-07-31 LAB — HEMOGLOBIN A1C: Hgb A1c MFr Bld: 6.1 % (ref 4.6–6.5)

## 2019-07-31 LAB — LIPID PANEL
Cholesterol: 172 mg/dL (ref 0–200)
HDL: 40.5 mg/dL (ref >39.00)
NonHDL: 131.37
Total CHOL/HDL Ratio: 4
Triglycerides: 338 mg/dL — ABNORMAL HIGH (ref 0.0–149.0)
VLDL: 67.6 mg/dL — ABNORMAL HIGH (ref 0.0–40.0)

## 2019-07-31 LAB — TSH: TSH: 2.03 u[IU]/mL (ref 0.35–4.50)

## 2019-07-31 MED ORDER — LOSARTAN POTASSIUM 25 MG PO TABS: ORAL_TABLET | ORAL | 3 refills | Status: DC

## 2019-07-31 NOTE — Patient Instructions (Signed)

## 2019-07-31 NOTE — Addendum Note (Signed)
Addended by: Anibal Henderson on: 07/31/2019 08:58 AM   Modules accepted: Orders

## 2019-07-31 NOTE — Progress Notes (Signed)
Subjective:     Patient ID: Laura [name], female   DOB: April 22, 1956, 63 y.o.   MRN: [id number]  HPI Laura [name] is seen for complete physical.  She just established with a new gynecologist earlier this year.  She gets full body check each year with dermatologist.  She had back surgery for lumbar stenosis back in June and that has gone well.  She is back walking.  She has lost 5 pounds compared to last year at this time. She has had prediabetes range blood sugars in the past.  She tries to follow a low glycemic diet.  Family history reviewed.  She has type 2 diabetes in her mom.  Health maintenance reviewed  -Needs flu vaccine -Getting regular Pap smears and mammograms through GYN -Colonoscopy up-to-date -Tetanus due 2025 -She has had previous Zostavax 2015  Past Medical History:  Diagnosis Date  . ABNORMAL ELECTROCARDIOGRAM 08/05/2010   02-19-15 EKG shows NSR. Pt voices no issues with heart related problems.  . ACNE, MILD 02/17/2008   not a problem so much 06-18-15  . ALLERGIC RHINITIS 01/25/2008  . Allergy   . ASTHMA 01/25/2008  . Asthma   . 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 CONSTIPATION 02/21/2008   uses fiber to control  . ESOPHAGEAL STRICTURE 02/17/2008   dilation done - no problems since.  Marland Kitchen GERD 01/25/2008   uses Prevacid as needed  . HYPERGLYCEMIA   . HYPERTENSION 08/05/2010  . Impaired glucose tolerance 01/11/2011  . METATARSALGIA 11/08/2007   work related- no recent issues"wearing steel toe shoes at the time"  . PONV (postoperative nausea and vomiting)   . Previous back surgery 10/26/17, 05/26/18  . RECTAL BLEEDING 01/04/2008   06-18-15 no problems now.  . Tendonitis    right wrist-has soft splint in place.  . Tibialis tendinitis 05/13/2010  . 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  Marland Kitchen VENTRICULAR HYPERTROPHY, LEFT 09/23/2010  . Wears glasses    Past Surgical History:   Procedure Laterality Date  . ABDOMINAL HYSTERECTOMY  2002   TAH-LTSO-LOA  . ARTHROSCOPIC REPAIR ACL Right 10/1998   right knee   . BACK SURGERY  2019  . BREAST EXCISIONAL BIOPSY Left 2015   benign  . BREAST SURGERY Left    lumpectomy-benign 2015  . CARDIOVASCULAR STRESS TEST  2011  . CARPAL TUNNEL RELEASE  2014   left  . CHOLECYSTECTOMY  1985   open  . COLONOSCOPY    . COLONOSCOPY W/ POLYPECTOMY    . DILATION AND CURETTAGE OF UTERUS    . ELBOW ARTHROPLASTY  2014   left  . ESOPHAGOGASTRODUODENOSCOPY  04/01/1998  . KNEE ARTHROSCOPY Bilateral    meniscus tear-left.  Marland Kitchen NASAL SINUS SURGERY     s/p  . ORIF ELBOW FRACTURE  12/23/2011   Procedure: OPEN REDUCTION INTERNAL FIXATION (ORIF) ELBOW/OLECRANON FRACTURE;  Surgeon: Linna Hoff, MD;  Location: [facility];  Service: Orthopedics;  Laterality: Left;  LEFT ELBOW ORIF OF PROXIMAL OLECRANON AND RADIAL HEAD, POSSIBLE ARTHROPLASTY  . OVARIAN CYST REMOVAL    . TOTAL KNEE ARTHROPLASTY Right 06/24/2015   Procedure: RIGHT TOTAL KNEE ARTHROPLASTY;  Surgeon: Gaynelle Arabian, MD;  Location: [facility];  Service: Orthopedics;  Laterality: Right;  . TRANSFORAMINAL LUMBAR INTERBODY FUSION (TLIF) WITH PEDICLE SCREW FIXATION 1 LEVEL Left 03/03/2019   Procedure: Left Lumbar four-five Redo Microdiscectomy with transforaminal lumbar interbody fusion;  Surgeon: Vertell Limber,  Broadus John, MD;  Location: [facility] [facility];  Service: Neurosurgery;  Laterality: Left;  . UPPER GASTROINTESTINAL ENDOSCOPY    . US ECHOCARDIOGRAPHY  2011    reports that she has never smoked. She has never used smokeless tobacco. She reports that she does not drink alcohol or use drugs. family history includes Cancer in an other family member; Diabetes in her mother; Thyroid disease in her mother. No Known Allergies   Review of Systems  Constitutional: Negative for activity change, appetite change, fatigue, fever and unexpected weight change.  HENT: Negative for ear pain, hearing loss, sore throat and trouble  swallowing.   Eyes: Negative for visual disturbance.  Respiratory: Negative for cough and shortness of breath.   Cardiovascular: Negative for chest pain and palpitations.  Gastrointestinal: Negative for abdominal pain, blood in stool, constipation and diarrhea.  Endocrine: Negative for polydipsia and polyuria.  Genitourinary: Negative for dysuria and hematuria.  Musculoskeletal: Negative for arthralgias and myalgias.  Skin: Negative for rash.  Neurological: Negative for dizziness, syncope and headaches.  Hematological: Negative for adenopathy.  Psychiatric/Behavioral: Negative for confusion and dysphoric mood.       Objective:   Physical Exam Constitutional:      Appearance: She is well-developed.  HENT:     Head: Normocephalic and atraumatic.  Eyes:     Pupils: Pupils are equal, round, and reactive to light.  Neck:     Musculoskeletal: Normal range of motion and neck supple.     Thyroid: No thyromegaly.  Cardiovascular:     Rate and Rhythm: Normal rate and regular rhythm.     Heart sounds: Normal heart sounds. No murmur.  Pulmonary:     Effort: No respiratory distress.     Breath sounds: Normal breath sounds. No wheezing or rales.  Abdominal:     General: Bowel sounds are normal. There is no distension.     Palpations: Abdomen is soft. There is no mass.     Tenderness: There is no abdominal tenderness. There is no guarding or rebound.  Musculoskeletal: Normal range of motion.  Lymphadenopathy:     Cervical: No cervical adenopathy.  Skin:    Findings: No rash.  Neurological:     Mental Status: She is alert and oriented to person, place, and time.     Cranial Nerves: No cranial nerve deficit.     Deep Tendon Reflexes: Reflexes normal.  Psychiatric:        Behavior: Behavior normal.        Thought Content: Thought content normal.        Judgment: Judgment normal.        Assessment:     Physical exam.  The following health maintenance issues were discussed.  She  has hypertension which is well controlled    Plan:     -Obtain screening labs.  Will include A1c with her family history of diabetes -She is encouraged to continue with weight loss efforts and regular exercise -Flu vaccine given -Discussed Shingrix vaccine but at this point she defers -Losartan refilled for 1 year  Eulas Post MD [facility]

## 2020-01-08 DIAGNOSIS — M25562 Pain in left knee: Secondary | ICD-10-CM | POA: Insufficient documentation

## 2020-05-03 IMAGING — RF LUMBAR SPINE - 2-3 VIEW
1 series · 3 of 3 positions shown · non-contrast
Comparison: Prior radiograph from earlier the same day as well as
previous MRI from 02/02/2019.

CLINICAL DATA: Intraoperative localization for redo L4-5 micro
discectomy with TLIF.

EXAM:
DG C-ARM 61-120 MIN; LUMBAR SPINE - 2-3 VIEW

[Series 1: run · 3 of 3 slices shown]
[im 1/3]
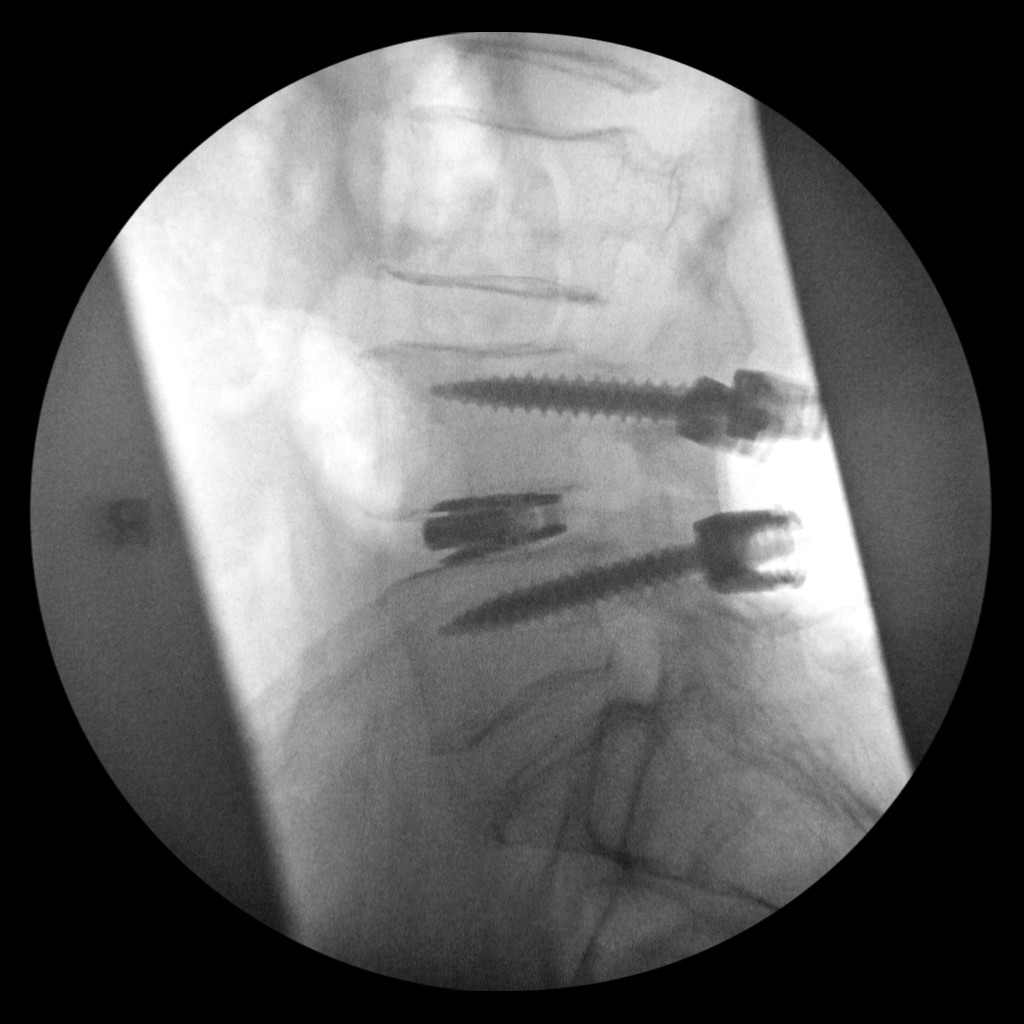
[im 2/3]
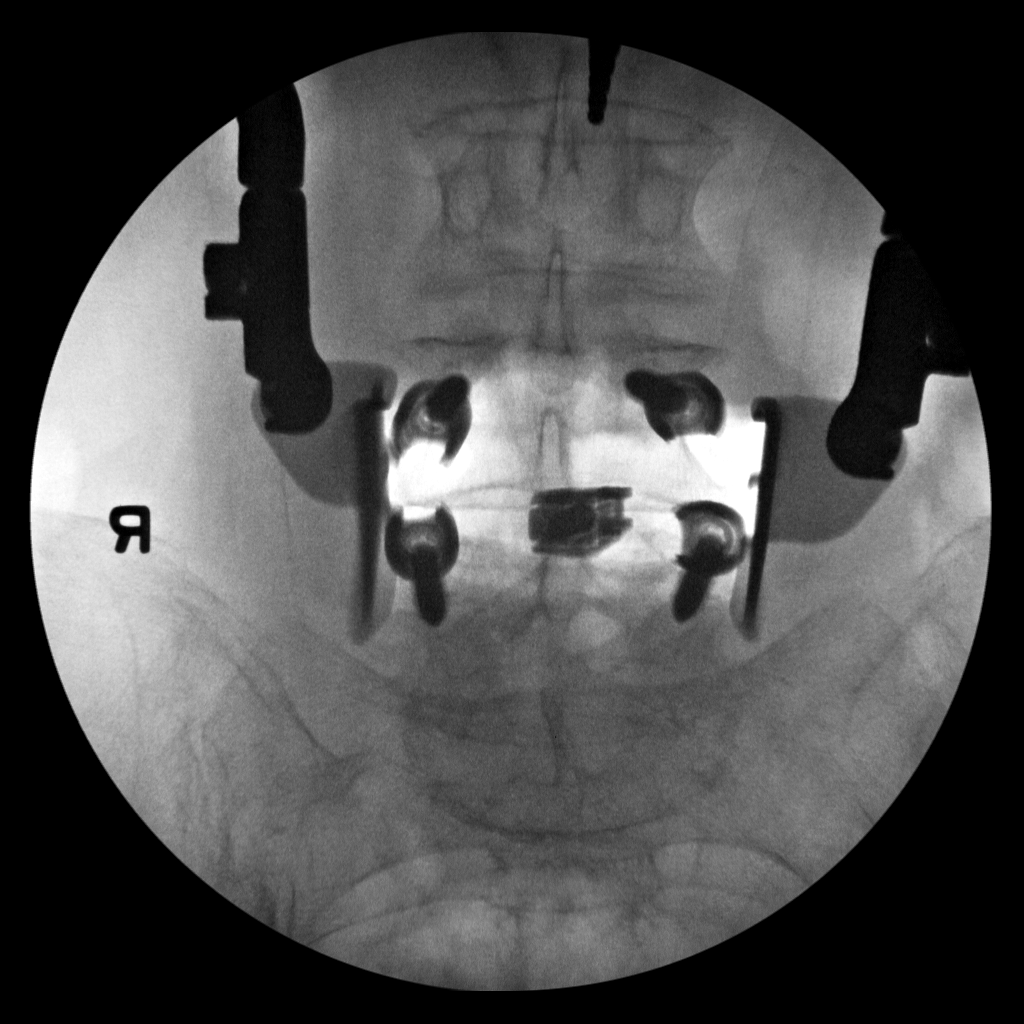
[im 3/3]
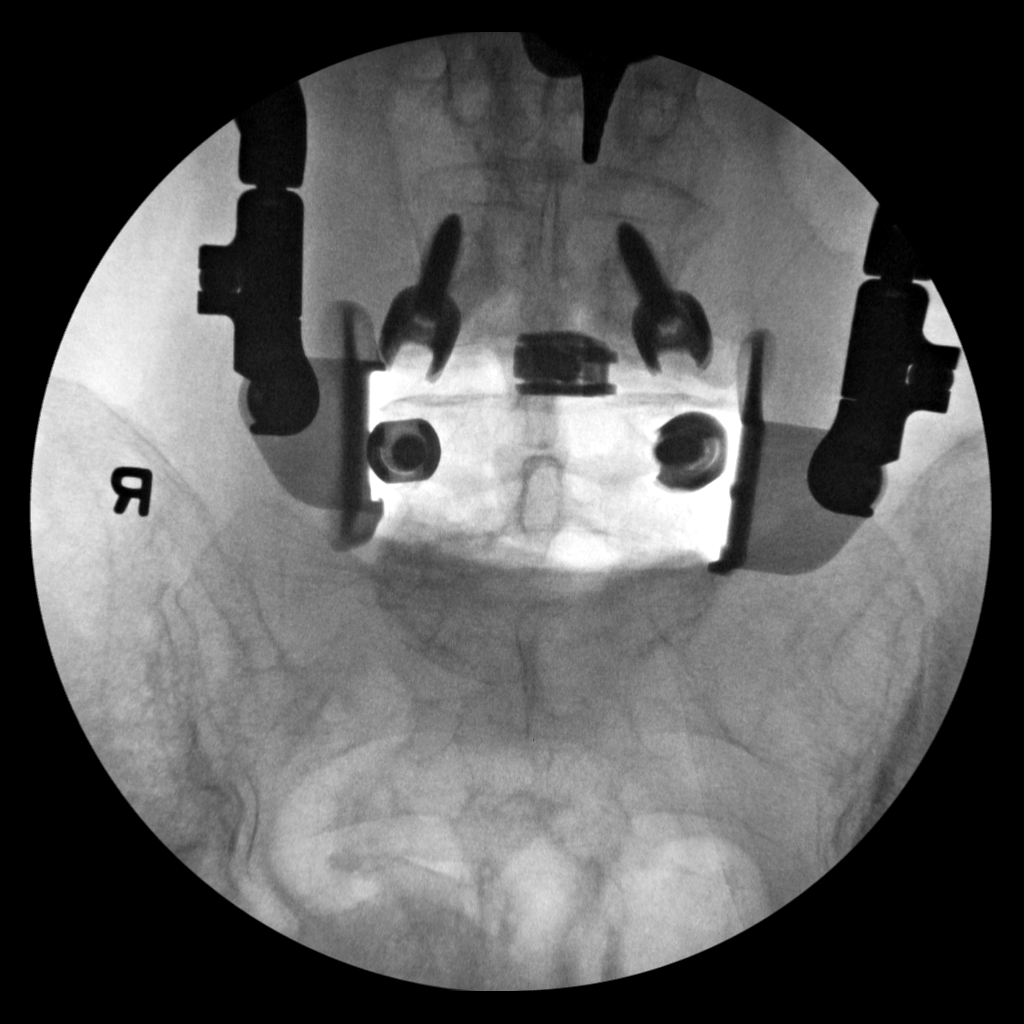

[3 of 3 positions shown; findings below may reference images not displayed]

FINDINGS: Spot PA and lateral fluoroscopic intraoperative views of the lumbar
spine from L4-5 discectomy with posterior and interbody fusion seen.
Interval placement of bilateral transpedicular screws in place at L4
and L5. Interbody device in place within the L4-5 interspace.
Hardware well aligned without adverse features.
IMPRESSION: Intraoperative fluoroscopic views for L4-5 discectomy and fusion.

## 2020-06-10 ENCOUNTER — Other Ambulatory Visit: Payer: Self-pay | Admitting: Obstetrics and Gynecology

## 2020-06-10 DIAGNOSIS — Z0000 Encounter for general adult medical examination without abnormal findings: Secondary | ICD-10-CM

## 2020-07-15 ENCOUNTER — Other Ambulatory Visit: Payer: Self-pay

## 2020-07-15 ENCOUNTER — Ambulatory Visit
Admission: RE | Admit: 2020-07-15 | Discharge: 2020-07-15 | Disposition: A | Discharge status: Home / Self Care | Payer: BC Managed Care – PPO | Source: Ambulatory Visit | Attending: Obstetrics and Gynecology | Admitting: Obstetrics and Gynecology

## 2020-07-15 DIAGNOSIS — Z0000 Encounter for general adult medical examination without abnormal findings: Secondary | ICD-10-CM

## 2020-07-31 ENCOUNTER — Ambulatory Visit: Payer: BC Managed Care – PPO | Admitting: Family Medicine

## 2020-07-31 ENCOUNTER — Encounter: Payer: Self-pay | Admitting: Family Medicine

## 2020-07-31 ENCOUNTER — Other Ambulatory Visit: Payer: Self-pay

## 2020-07-31 VITALS — BP 152/78 | HR 69 | Ht 66.0 in | Wt 180.0 lb

## 2020-07-31 DIAGNOSIS — Z23 Encounter for immunization: Secondary | ICD-10-CM

## 2020-07-31 DIAGNOSIS — Z0000 Encounter for general adult medical examination without abnormal findings: Secondary | ICD-10-CM | POA: Diagnosis not present

## 2020-07-31 NOTE — Progress Notes (Signed)
Established Patient Office Visit  Subjective:  Patient ID: Laura [name], female    DOB: Jun 18, 1956  Age: 64 y.o. MRN: [id number]  CC:  Chief Complaint  Patient presents with  . Annual Exam    HPI Laura [name] presents for physical exam.  She has history of hypertension, GERD, past history of impaired glucose tolerance, osteoporosis.  She is followed gynecologist regularly.  She is had some mild weight gain this past year and not exercising consistently.  She feels she has been overeating some amount of stress.  Does take low-dose losartan 25 mg daily.  Health maintenance reviewed She is getting mammograms and apparently still gets Pap smears through GYN.  She has had previous hysterectomy.  Health Maintenance  Topic Date Due  . PAP SMEAR-Modifier  07/19/2021  . MAMMOGRAM  07/15/2022  . TETANUS/TDAP  06/22/2024  . COLONOSCOPY  05/09/2028  . INFLUENZA VACCINE  Completed  . COVID-19 Vaccine  Completed  . Hepatitis C Screening  Completed  . HIV Screening  Completed   Social history-married for 32 years.  This is second marriage.  She has one 24 year old son from current marriage.  No grandchildren.  Never smoked.  No alcohol.  Family history-mother is still alive and has history of type 1 diabetes.  She apparently also has hypothyroidism.  Father had coronary disease was around age is 20.  She has a brother who is alive and well  Past Medical History:  Diagnosis Date  . ABNORMAL ELECTROCARDIOGRAM 08/05/2010   02-19-15 EKG shows NSR. Pt voices no issues with heart related problems.  . ACNE, MILD 02/17/2008   not a problem so much 06-18-15  . ALLERGIC RHINITIS 01/25/2008  . Allergy   . ASTHMA 01/25/2008  . Asthma   . 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 CONSTIPATION 02/21/2008   uses fiber to control  . ESOPHAGEAL STRICTURE 02/17/2008   dilation done - no problems since.  Marland Kitchen GERD 01/25/2008   uses Prevacid as needed  . HYPERGLYCEMIA   . HYPERTENSION 08/05/2010  .  Impaired glucose tolerance 01/11/2011  . METATARSALGIA 11/08/2007   work related- no recent issues"wearing steel toe shoes at the time"  . PONV (postoperative nausea and vomiting)   . Previous back surgery 10/26/17, 05/26/18  . RECTAL BLEEDING 01/04/2008   06-18-15 no problems now.  . Tendonitis    right wrist-has soft splint in place.  . Tibialis tendinitis 05/13/2010  . 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  Marland Kitchen VENTRICULAR HYPERTROPHY, LEFT 09/23/2010  . Wears glasses     Past Surgical History:  Procedure Laterality Date  . ABDOMINAL HYSTERECTOMY  2002   TAH-LTSO-LOA  . ARTHROSCOPIC REPAIR ACL Right 10/1998   right knee   . BACK SURGERY  2019  . BREAST EXCISIONAL BIOPSY Left 2015   benign  . BREAST SURGERY Left    lumpectomy-benign 2015  . CARDIOVASCULAR STRESS TEST  2011  . CARPAL TUNNEL RELEASE  2014   left  . CHOLECYSTECTOMY  1985   open  . COLONOSCOPY    . COLONOSCOPY W/ POLYPECTOMY    . DILATION AND CURETTAGE OF UTERUS    . ELBOW ARTHROPLASTY  2014   left  . ESOPHAGOGASTRODUODENOSCOPY  04/01/1998  . KNEE ARTHROSCOPY Bilateral    meniscus tear-left.  Marland Kitchen NASAL SINUS SURGERY     s/p  . ORIF ELBOW FRACTURE  12/23/2011  Procedure: OPEN REDUCTION INTERNAL FIXATION (ORIF) ELBOW/OLECRANON FRACTURE;  Surgeon: Linna Hoff, MD;  Location: [facility];  Service: Orthopedics;  Laterality: Left;  LEFT ELBOW ORIF OF PROXIMAL OLECRANON AND RADIAL HEAD, POSSIBLE ARTHROPLASTY  . OVARIAN CYST REMOVAL    . TOTAL KNEE ARTHROPLASTY Right 06/24/2015   Procedure: RIGHT TOTAL KNEE ARTHROPLASTY;  Surgeon: Gaynelle Arabian, MD;  Location: [facility];  Service: Orthopedics;  Laterality: Right;  . TRANSFORAMINAL LUMBAR INTERBODY FUSION (TLIF) WITH PEDICLE SCREW FIXATION 1 LEVEL Left 03/03/2019   Procedure: Left Lumbar four-five Redo Microdiscectomy with transforaminal lumbar interbody fusion;  Surgeon: Erline Levine,  MD;  Location: [facility];  Service: Neurosurgery;  Laterality: Left;  . UPPER GASTROINTESTINAL ENDOSCOPY    . US ECHOCARDIOGRAPHY  2011    Family History  Problem Relation Age of Onset  . Diabetes Mother   . Thyroid disease Mother        hypothyroidism  . Cancer Other        Pancreatic Cancer-Aunt  . Heart disease Father 36       cad  . Colon cancer Neg Hx   . Colon polyps Neg Hx   . Esophageal cancer Neg Hx   . Stomach cancer Neg Hx   . Rectal cancer Neg Hx     Social History   Socioeconomic History  . Marital status: Married    Spouse name: Not on file  . Number of children: Not on file  . Years of education: Not on file  . Highest education level: Not on file  Occupational History  . Not on file  Tobacco Use  . Smoking status: Never Smoker  . Smokeless tobacco: Never Used  Vaping Use  . Vaping Use: Never used  Substance and Sexual Activity  . Alcohol use: No  . Drug use: No  . Sexual activity: Not on file  Other Topics Concern  . Not on file  Social History Narrative   Pt does not get regular exercise.   Daily Caffeine Use (12 oz Coke Zero per day)   Social Determinants of Health   Financial Resource Strain:   . Difficulty of Paying Living Expenses: Not on file  Food Insecurity:   . Worried About Charity fundraiser in the Last Year: Not on file  . Ran Out of Food in the Last Year: Not on file  Transportation Needs:   . Lack of Transportation (Medical): Not on file  . Lack of Transportation (Non-Medical): Not on file  Physical Activity:   . Days of Exercise per Week: Not on file  . Minutes of Exercise per Session: Not on file  Stress:   . Feeling of Stress : Not on file  Social Connections:   . Frequency of Communication with Friends and Family: Not on file  . Frequency of Social Gatherings with Friends and Family: Not on file  . Attends Religious Services: Not on file  . Active Member of Clubs or Organizations: Not on file  . Attends Theatre manager Meetings: Not on file  . Marital Status: Not on file  Intimate Partner Violence:   . Fear of Current or Ex-Partner: Not on file  . Emotionally Abused: Not on file  . Physically Abused: Not on file  . Sexually Abused: Not on file    Outpatient Medications Prior to Visit  Medication Sig Dispense Refill  . aspirin EC 81 MG tablet Take 81 mg by mouth daily.    . Calcium Carbonate-Vitamin D (CALTRATE 600+D  PO) Take 1 tablet by mouth 2 (two) times a day.     . Fiber CHEW Chew 2 tablets by mouth every morning.     . fluticasone (FLONASE) 50 MCG/ACT nasal spray Place 1 spray into both nostrils daily as needed for allergies.     Marland Kitchen losartan (COZAAR) 25 MG tablet TAKE 1 TABLET (25 MG TOTAL) BY MOUTH DAILY. 90 tablet 3  . methocarbamol (ROBAXIN) 500 MG tablet Take 1 tablet (500 mg total) by mouth every 6 (six) hours as needed for muscle spasms. 40 tablet 3  . Multiple Vitamin (MULTIVITAMIN) tablet Take 1 tablet by mouth daily.    . vitamin E 400 UNIT capsule Take 400 Units by mouth daily.     Facility-Administered Medications Prior to Visit  Medication Dose Route Frequency Provider Last Rate Last Admin  . 0.9 %  sodium chloride infusion  500 mL Intravenous Once Nelida Meuse III, MD        No Known Allergies  ROS Review of Systems  Constitutional: Negative for activity change, appetite change, fatigue, fever and unexpected weight change.  HENT: Negative for ear pain, hearing loss, sore throat and trouble swallowing.   Eyes: Negative for visual disturbance.  Respiratory: Negative for cough and shortness of breath.   Cardiovascular: Negative for chest pain and palpitations.  Gastrointestinal: Negative for abdominal pain, blood in stool, constipation and diarrhea.  Genitourinary: Negative for dysuria and hematuria.  Musculoskeletal: Negative for arthralgias, back pain and myalgias.  Skin: Negative for rash.  Neurological: Negative for dizziness, syncope and headaches.    Hematological: Negative for adenopathy.  Psychiatric/Behavioral: Negative for confusion and dysphoric mood.      Objective:    Physical Exam Constitutional:      Appearance: She is well-developed.  HENT:     Head: Normocephalic and atraumatic.  Eyes:     Pupils: Pupils are equal, round, and reactive to light.  Neck:     Thyroid: No thyromegaly.  Cardiovascular:     Rate and Rhythm: Normal rate and regular rhythm.     Heart sounds: Normal heart sounds. No murmur heard.   Pulmonary:     Effort: No respiratory distress.     Breath sounds: Normal breath sounds. No wheezing or rales.  Abdominal:     General: Bowel sounds are normal. There is no distension.     Palpations: Abdomen is soft. There is no mass.     Tenderness: There is no abdominal tenderness. There is no guarding or rebound.  Musculoskeletal:        General: Normal range of motion.     Cervical back: Normal range of motion and neck supple.  Lymphadenopathy:     Cervical: No cervical adenopathy.  Skin:    Findings: No rash.     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  She states this is been present for years and unchanged.  She sees dermatologist yearly.  Neurological:     Mental Status: She is alert and oriented to person, place, and time.     Cranial Nerves: No cranial nerve deficit.     Deep Tendon Reflexes: Reflexes normal.  Psychiatric:        Behavior: Behavior normal.        Thought Content: Thought content normal.        Judgment: Judgment normal.     BP (!) 152/78 (BP Location: Left Arm, Cuff Size: Normal)   Pulse 69   Ht  5\' 6"  (1.676 m)   Wt 180 lb (81.6 kg)   BMI 29.05 kg/m  Wt Readings from Last 3 Encounters:  07/31/20 180 lb (81.6 kg)  07/31/19 177 lb 1.6 oz (80.3 kg)  02/28/19 172 lb 8 oz (78.2 kg)     There are no preventive care reminders to display for this patient.  There are no preventive care reminders to display for this  patient.  Lab Results  Component Value Date   TSH 2.03 07/31/2019   Lab Results  Component Value Date   WBC 6.7 07/31/2019   HGB 14.5 07/31/2019   HCT 43.3 07/31/2019   MCV 88.5 07/31/2019   PLT 298.0 07/31/2019   Lab Results  Component Value Date   NA 140 07/31/2019   K 3.9 07/31/2019   CO2 26 07/31/2019   GLUCOSE 109 (H) 07/31/2019   BUN 13 07/31/2019   CREATININE 0.60 07/31/2019   BILITOT 0.5 07/31/2019   ALKPHOS 103 07/31/2019   AST 20 07/31/2019   ALT 23 07/31/2019   PROT 7.5 07/31/2019   ALBUMIN 4.7 07/31/2019   CALCIUM 9.6 07/31/2019   ANIONGAP 9 02/28/2019   GFR 101.06 07/31/2019   Lab Results  Component Value Date   CHOL 172 07/31/2019   Lab Results  Component Value Date   HDL 40.50 07/31/2019   Lab Results  Component Value Date   LDLCALC 59 11/30/2011   Lab Results  Component Value Date   TRIG 338.0 (H) 07/31/2019   Lab Results  Component Value Date   CHOLHDL 4 07/31/2019   Lab Results  Component Value Date   HGBA1C 6.1 07/31/2019      Assessment & Plan:   Problem List Items Addressed This Visit    None    Visit Diagnoses    Need for influenza vaccination    -  Primary   Relevant Orders   Flu Vaccine QUAD 36+ mos IM (Completed)   Physical exam       Relevant Orders   Basic metabolic panel   Lipid panel   CBC with Differential/Platelet   TSH   Hepatic function panel   Hemoglobin A1c      -Discussed Shingrix vaccine and she will consider   -She has had some weight gain during the past year and does have slightly elevated blood pressure today.  We recommend weight loss, establish more consistent exercise such as walking in 58-month follow-up.  Not further goal at that point titrate losartan  -Obtain screening labs  -She plans to continue follow-up with gynecologist for her mammograms  No orders of the defined types were placed in this encounter.   Follow-up: Return in about 3 months (around 10/31/2020).    Carolann Littler, MD

## 2020-07-31 NOTE — Patient Instructions (Signed)
Preventive Care 27-63 Years Old, Female Preventive care refers to visits with your health care provider and lifestyle choices that can promote health and wellness. This includes:  A yearly physical exam. This may also be called an annual well check.  Regular dental visits and eye exams.  Immunizations.  Screening for certain conditions.  Healthy lifestyle choices, such as eating a healthy diet, getting regular exercise, not using drugs or products that contain nicotine and tobacco, and limiting alcohol use. What can I expect for my preventive care visit? Physical exam Your health care provider will check your:  Height and weight. This may be used to calculate body mass index (BMI), which tells if you are at a healthy weight.  Heart rate and blood pressure.  Skin for abnormal spots. Counseling Your health care provider may ask you questions about your:  Alcohol, tobacco, and drug use.  Emotional well-being.  Home and relationship well-being.  Sexual activity.  Eating habits.  Work and work Statistician.  Method of birth control.  Menstrual cycle.  Pregnancy history. What immunizations do I need?  Influenza (flu) vaccine  This is recommended every year. Tetanus, diphtheria, and pertussis (Tdap) vaccine  You may need a Td booster every 10 years. Varicella (chickenpox) vaccine  You may need this if you have not been vaccinated. Zoster (shingles) vaccine  You may need this after age 64. Measles, mumps, and rubella (MMR) vaccine  You may need at least one dose of MMR if you were born in 1957 or later. You may also need a second dose. Pneumococcal conjugate (PCV13) vaccine  You may need this if you have certain conditions and were not previously vaccinated. Pneumococcal polysaccharide (PPSV23) vaccine  You may need one or two doses if you smoke cigarettes or if you have certain conditions. Meningococcal conjugate (MenACWY) vaccine  You may need this if you  have certain conditions. Hepatitis A vaccine  You may need this if you have certain conditions or if you travel or work in places where you may be exposed to hepatitis A. Hepatitis B vaccine  You may need this if you have certain conditions or if you travel or work in places where you may be exposed to hepatitis B. Haemophilus influenzae type b (Hib) vaccine  You may need this if you have certain conditions. Human papillomavirus (HPV) vaccine  If recommended by your health care provider, you may need three doses over 6 months. You may receive vaccines as individual doses or as more than one vaccine together in one shot (combination vaccines). Talk with your health care provider about the risks and benefits of combination vaccines. What tests do I need? Blood tests  Lipid and cholesterol levels. These may be checked every 5 years, or more frequently if you are over 5 years old.  Hepatitis C test.  Hepatitis B test. Screening  Lung cancer screening. You may have this screening every year starting at age 13 if you have a 30-pack-year history of smoking and currently smoke or have quit within the past 15 years.  Colorectal cancer screening. All adults should have this screening starting at age 44 and continuing until age 21. Your health care provider may recommend screening at age 81 if you are at increased risk. You will have tests every 1-10 years, depending on your results and the type of screening test.  Diabetes screening. This is done by checking your blood sugar (glucose) after you have not eaten for a while (fasting). You may have this  done every 1-3 years.  Mammogram. This may be done every 1-2 years. Talk with your health care provider about when you should start having regular mammograms. This may depend on whether you have a family history of breast cancer.  BRCA-related cancer screening. This may be done if you have a family history of breast, ovarian, tubal, or peritoneal  cancers.  Pelvic exam and Pap test. This may be done every 3 years starting at age 59. Starting at age 29, this may be done every 5 years if you have a Pap test in combination with an HPV test. Other tests  Sexually transmitted disease (STD) testing.  Bone density scan. This is done to screen for osteoporosis. You may have this scan if you are at high risk for osteoporosis. Follow these instructions at home: Eating and drinking  Eat a diet that includes fresh fruits and vegetables, whole grains, lean protein, and low-fat dairy.  Take vitamin and mineral supplements as recommended by your health care provider.  Do not drink alcohol if: ? Your health care provider tells you not to drink. ? You are pregnant, may be pregnant, or are planning to become pregnant.  If you drink alcohol: ? Limit how much you have to 0-1 drink a day. ? Be aware of how much alcohol is in your drink. In the [place] one drink equals one 12 oz bottle of beer (355 mL), one 5 oz glass of wine (148 mL), or one 1 oz glass of hard liquor (44 mL). Lifestyle  Take daily care of your teeth and gums.  Stay active. Exercise for at least 30 minutes on 5 or more days each week.  Do not use any products that contain nicotine or tobacco, such as cigarettes, e-cigarettes, and chewing tobacco. If you need help quitting, ask your health care provider.  If you are sexually active, practice safe sex. Use a condom or other form of birth control (contraception) in order to prevent pregnancy and STIs (sexually transmitted infections).  If told by your health care provider, take low-dose aspirin daily starting at age 60. What's next?  Visit your health care provider once a year for a well check visit.  Ask your health care provider how often you should have your eyes and teeth checked.  Stay up to date on all vaccines. This information is not intended to replace advice given to you by your health care provider. Make sure you  discuss any questions you have with your health care provider. Document Revised: 05/12/2018 Document Reviewed: 05/12/2018 Elsevier Patient Education  2020 [employer].  Try to lose some weight as discussed  Let's consider 3 month follow up to reassess BP  Consider Shingrix vaccine at some point.

## 2020-08-01 LAB — CBC WITH DIFFERENTIAL/PLATELET
Absolute Monocytes: 374 cells/uL (ref 200–950)
Basophils Absolute: 48 cells/uL (ref 0–200)
Basophils Relative: 0.7 %
Eosinophils Absolute: 82 cells/uL (ref 15–500)
Eosinophils Relative: 1.2 %
HCT: 43.3 % (ref 35.0–45.0)
Hemoglobin: 14.4 g/dL (ref 11.7–15.5)
Lymphs Abs: 3237 cells/uL (ref 850–3900)
MCH: 29.8 pg (ref 27.0–33.0)
MCHC: 33.3 g/dL (ref 32.0–36.0)
MCV: 89.5 fL (ref 80.0–100.0)
MPV: 10.3 fL (ref 7.5–12.5)
Monocytes Relative: 5.5 %
Neutro Abs: 3060 cells/uL (ref 1500–7800)
Neutrophils Relative %: 45 %
Platelets: 293 10*3/uL (ref 140–400)
RBC: 4.84 10*6/uL (ref 3.80–5.10)
RDW: 13 % (ref 11.0–15.0)
Total Lymphocyte: 47.6 %
WBC: 6.8 10*3/uL (ref 3.8–10.8)

## 2020-08-01 LAB — HEPATIC FUNCTION PANEL
AG Ratio: 1.9 (calc) (ref 1.0–2.5)
ALT: 26 U/L (ref 6–29)
AST: 23 U/L (ref 10–35)
Albumin: 5 g/dL (ref 3.6–5.1)
Alkaline phosphatase (APISO): 96 U/L (ref 37–153)
Bilirubin, Direct: 0.1 mg/dL (ref 0.0–0.2)
Globulin: 2.6 g/dL (calc) (ref 1.9–3.7)
Indirect Bilirubin: 0.5 mg/dL (calc) (ref 0.2–1.2)
Total Bilirubin: 0.6 mg/dL (ref 0.2–1.2)
Total Protein: 7.6 g/dL (ref 6.1–8.1)

## 2020-08-01 LAB — HEMOGLOBIN A1C
Hgb A1c MFr Bld: 5.9 % of total Hgb — ABNORMAL HIGH (ref <5.7)
Mean Plasma Glucose: 123 (calc)
eAG (mmol/L): 6.8 (calc)

## 2020-08-01 LAB — BASIC METABOLIC PANEL
BUN: 15 mg/dL (ref 7–25)
CO2: 28 mmol/L (ref 20–32)
Calcium: 10.1 mg/dL (ref 8.6–10.4)
Chloride: 105 mmol/L (ref 98–110)
Creat: 0.66 mg/dL (ref 0.50–0.99)
Glucose, Bld: 108 mg/dL — ABNORMAL HIGH (ref 65–99)
Potassium: 4.7 mmol/L (ref 3.5–5.3)
Sodium: 142 mmol/L (ref 135–146)

## 2020-08-01 LAB — LIPID PANEL
Cholesterol: 185 mg/dL (ref <200)
HDL: 45 mg/dL — ABNORMAL LOW (ref >=50)
LDL Cholesterol (Calc): 94 mg/dL (calc)
Non-HDL Cholesterol (Calc): 140 mg/dL (calc) — ABNORMAL HIGH (ref <130)
Total CHOL/HDL Ratio: 4.1 (calc) (ref <5.0)
Triglycerides: 347 mg/dL — ABNORMAL HIGH (ref <150)

## 2020-08-01 LAB — TSH: TSH: 2.04 mIU/L (ref 0.40–4.50)

## 2020-08-02 NOTE — Progress Notes (Signed)
Glucose in the "prediabetes" range, but A1C is stable at 5.9%.  lose some weight, as discussed and this should improve blood sugars and triglycerides.

## 2020-08-20 ENCOUNTER — Other Ambulatory Visit: Payer: Self-pay | Admitting: Family Medicine

## 2020-09-13 IMAGING — MG MM DIGITAL DIAGNOSTIC UNILAT*L* W/ TOMO
4 series · 4 of 12 positions shown · non-contrast
Comparison: Previous exam(s).

CLINICAL DATA: Recall from screening mammography with
tomosynthesis, possible mass and possible subtle architectural
distortion involving the UPPER LEFT breast at POSTERIOR depth.

EXAM:
DIGITAL DIAGNOSTIC LEFT MAMMOGRAM WITH TOMO
ULTRASOUND LEFT BREAST

[L MLO synth-2D]
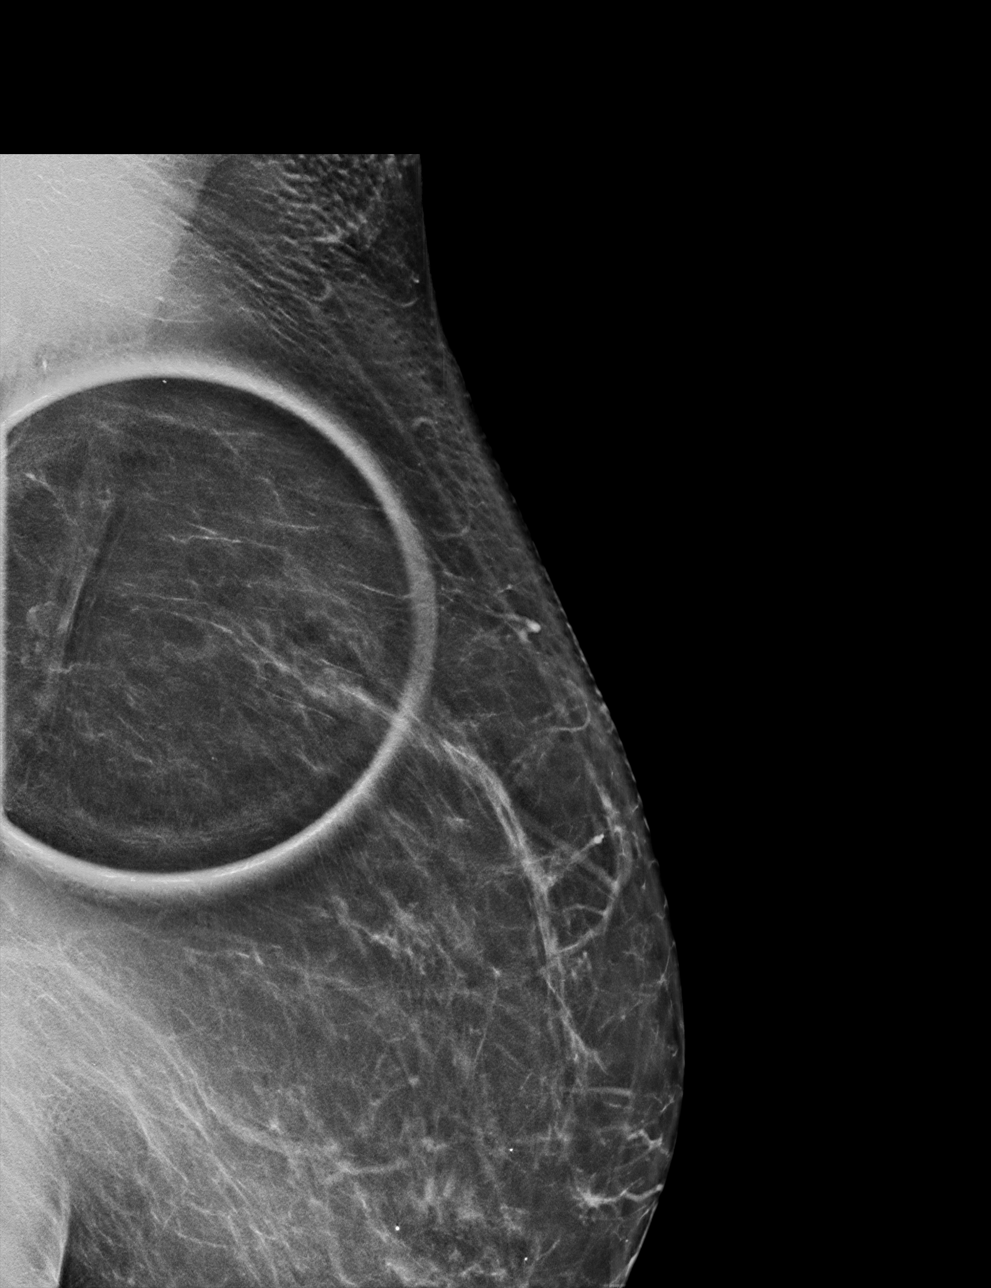

[L CC synth-2D]
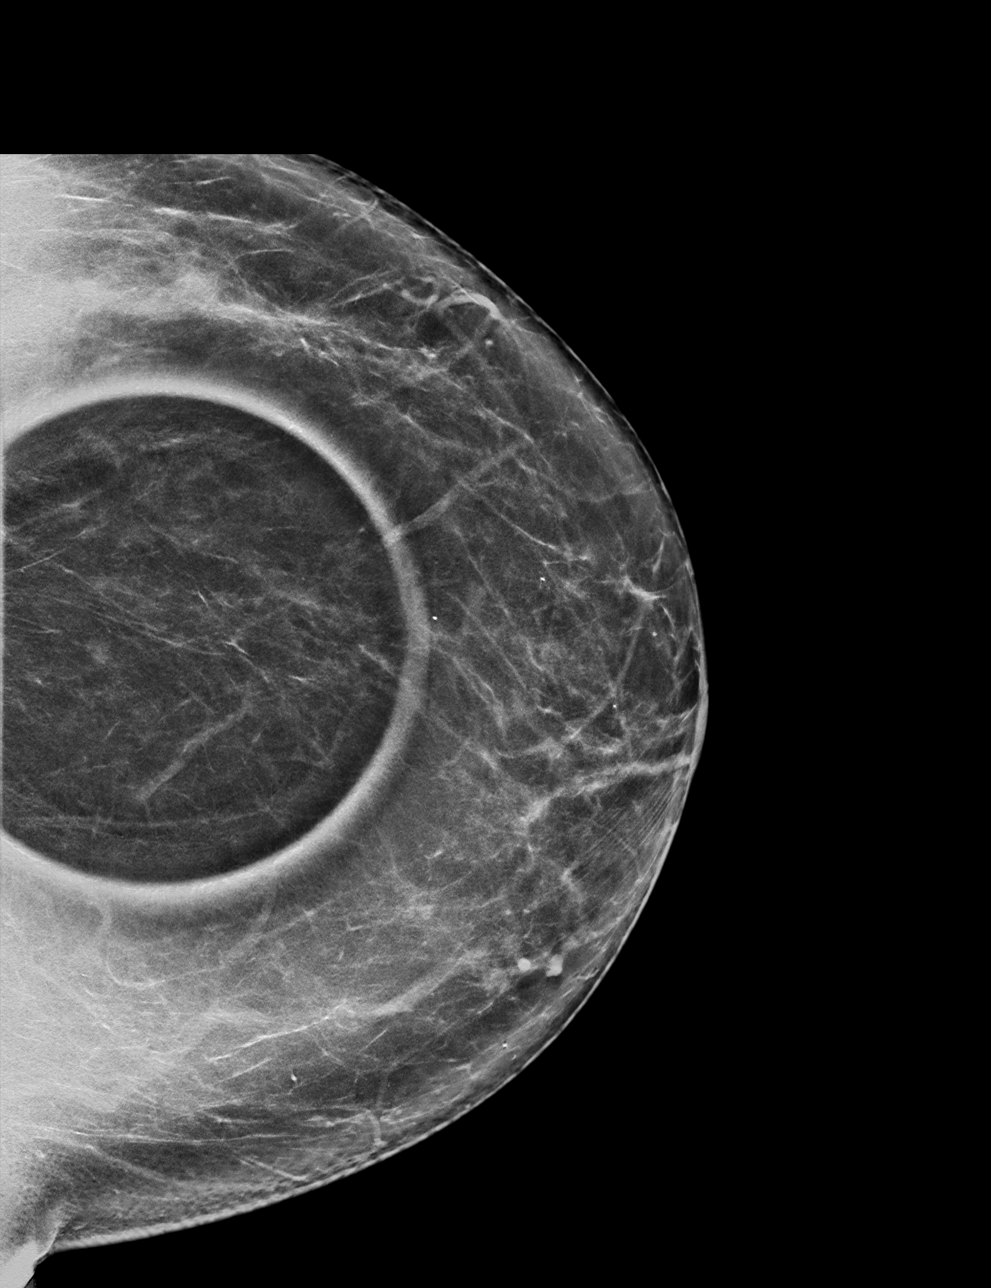

[L MLO tomo · tomo slice 44/87.0]
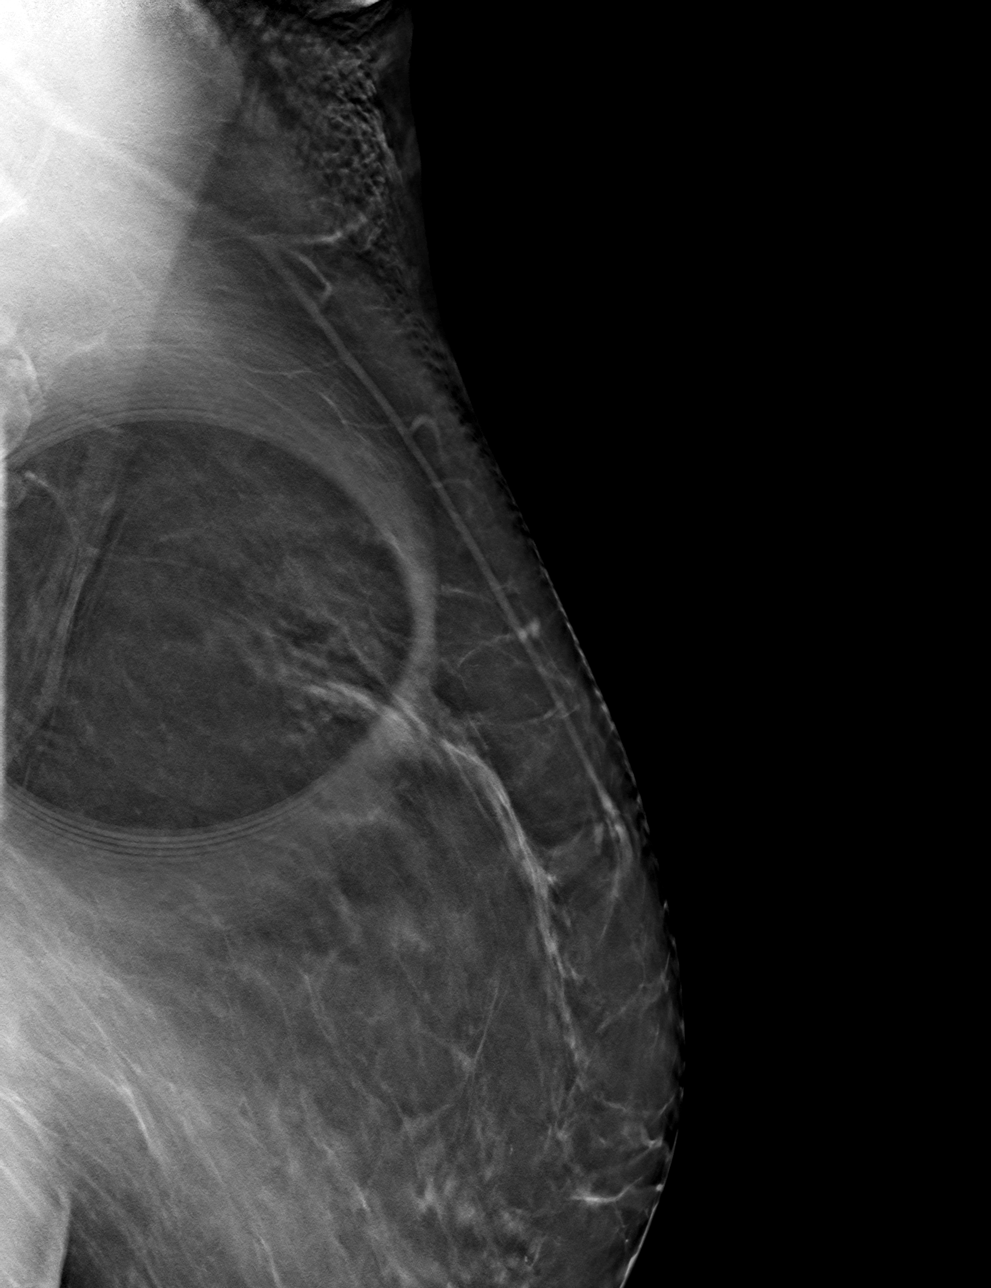

[L CC tomo · tomo slice 39/78.0]
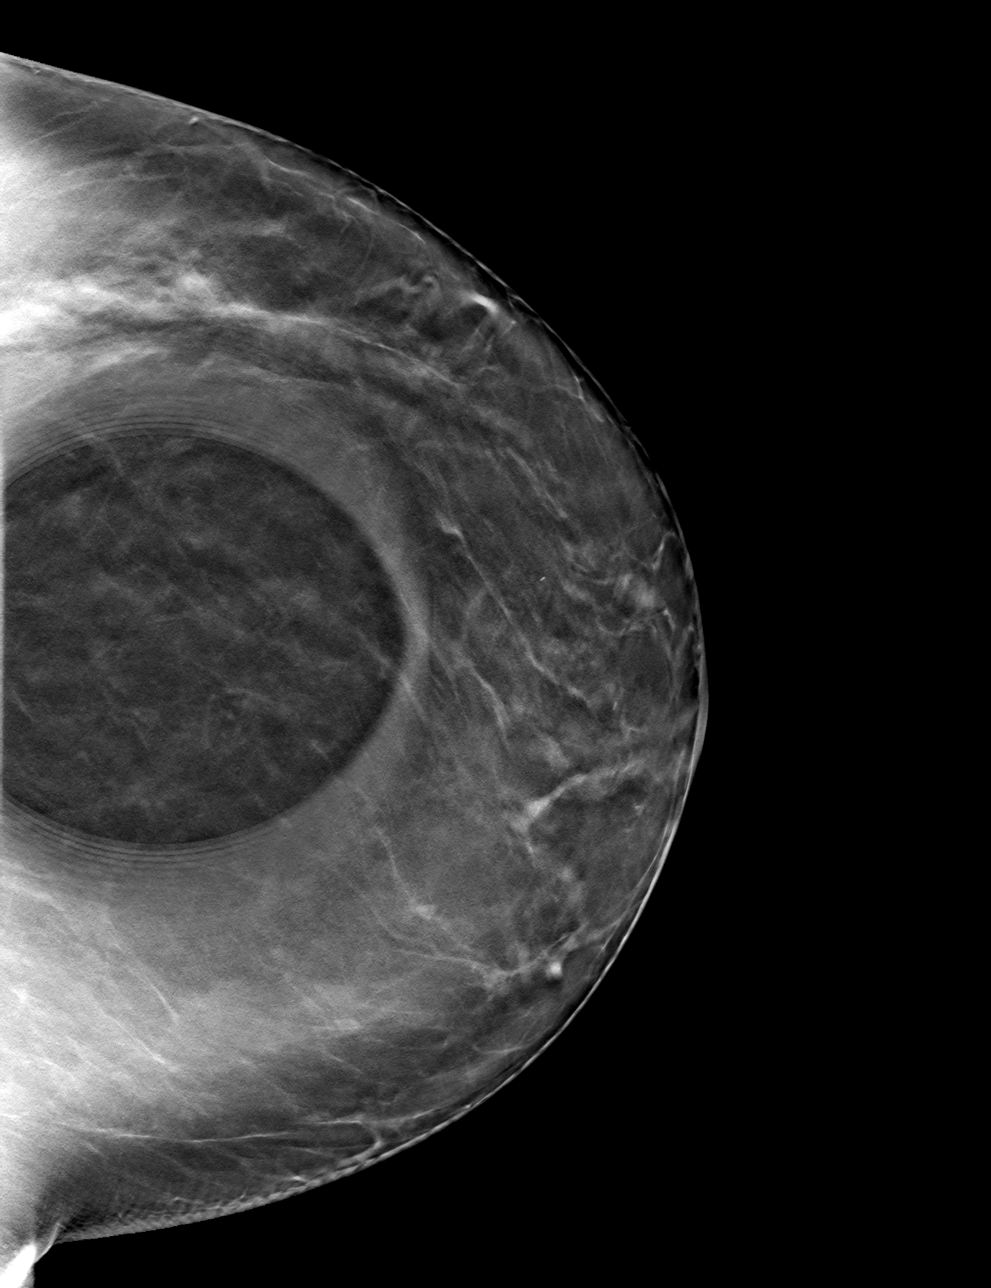

[4 of 12 positions shown; findings below may reference images not displayed]

ACR Breast Density Category b: There are scattered areas of
fibroglandular density.
FINDINGS: Tomosynthesis and synthesized spot-compression CC and MLO views of
the area of concern in the LEFT breast were obtained.

The mass is most conspicuous on the spot compression CC images where
it is shown to be an approximate 5 mm low-density mass. There is no
associated architectural distortion or suspicious calcifications.
The mass is inconspicuous on the spot compression MLO images, and
there is no architectural distortion as questioned on the screening
mammogram.

Targeted LEFT breast ultrasound is performed, showing an oval
parallel nearly anechoic mass with scattered internal echoes at the
1 o'clock position approximately 2 cm from the nipple at POSTERIOR
depth, measuring approximately 2 x 4 x 2 mm, demonstrating no
posterior characteristics and no internal power Doppler flow,
corresponding to the screening mammographic finding. No suspicious
solid mass or abnormal acoustic shadowing is identified.
IMPRESSION: 1. No mammographic or sonographic evidence of malignancy involving
the LEFT breast.
2. Benign mildly complex cyst in the slight UPPER OUTER QUADRANT of
the LEFT breast at POSTERIOR depth which accounts for the screening
mammographic finding.

RECOMMENDATION:
Screening mammogram in one year.(Code:[id number])

I have discussed the findings and recommendations with the patient.
If applicable, a reminder letter will be sent to the patient
regarding the next appointment.

BI-RADS CATEGORY  2: Benign.

## 2020-09-19 ENCOUNTER — Ambulatory Visit: Payer: BC Managed Care – PPO | Attending: Internal Medicine

## 2020-09-19 DIAGNOSIS — Z23 Encounter for immunization: Secondary | ICD-10-CM

## 2020-09-19 NOTE — Progress Notes (Signed)
   Covid-19 Vaccination Clinic  Name:  Laura [name]    MRN: [id number] DOB: 1955/10/14  09/19/2020  [name] was observed post Covid-19 immunization for 15 minutes without incident. She was provided with Vaccine Information Sheet and instruction to access the V-Safe system.   [name] was instructed to call 911 with any severe reactions post vaccine: Marland Kitchen Difficulty breathing  . Swelling of face and throat  . A fast heartbeat  . A bad rash all over body  . Dizziness and weakness   Immunizations Administered    Name Date Dose VIS Date Route   Pfizer COVID-19 Vaccine 09/19/2020  1:16 PM 0.3 mL 07/03/2020 Intramuscular   Manufacturer: [employer], [employer]   Lot: [id number]   NDC: [address]-8

## 2020-11-01 ENCOUNTER — Ambulatory Visit: Payer: BC Managed Care – PPO | Admitting: Family Medicine

## 2020-12-05 ENCOUNTER — Other Ambulatory Visit: Payer: Self-pay

## 2020-12-06 ENCOUNTER — Encounter: Payer: Self-pay | Admitting: Family Medicine

## 2020-12-06 ENCOUNTER — Ambulatory Visit: Payer: BC Managed Care – PPO | Admitting: Family Medicine

## 2020-12-06 VITALS — BP 128/76 | HR 74 | Temp 97.7°F | Ht 66.0 in | Wt 172.4 lb

## 2020-12-06 DIAGNOSIS — I10 Essential (primary) hypertension: Secondary | ICD-10-CM

## 2020-12-06 NOTE — Progress Notes (Signed)
Established Patient Office Visit  Subjective:  Patient ID: Laura [name], female    DOB: July 05, 1956  Age: 65 y.o. MRN: [id number]  CC:  Chief Complaint  Patient presents with  . Follow-up    HPI [name] presents for follow-up hypertension.  She was seen for well visit back in November.  Her blood pressure then was 062 systolic.  She is on losartan 25 mg daily and taking consistently.  We discussed options of increasing medication versus lifestyle management and she prefer the latter.  She has done a great job with dietary change.  She has lost approximately 8 pounds.  She is walking more.  She and husband have new blood pressure cuff and she is consistently getting around 376 systolic over 28B to 80 diastolic.  Feels well overall.  No specific complaints.  Does have family history of type 2 diabetes in her mom.  Her A1c recently was 5.9%  Past Medical History:  Diagnosis Date  . ABNORMAL ELECTROCARDIOGRAM 08/05/2010   02-19-15 EKG shows NSR. Pt voices no issues with heart related problems.  . ACNE, MILD 02/17/2008   not a problem so much 06-18-15  . ALLERGIC RHINITIS 01/25/2008  . Allergy   . ASTHMA 01/25/2008  . Asthma   . 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 CONSTIPATION 02/21/2008   uses fiber to control  . ESOPHAGEAL STRICTURE 02/17/2008   dilation done - no problems since.  Marland Kitchen GERD 01/25/2008   uses Prevacid as needed  . HYPERGLYCEMIA   . HYPERTENSION 08/05/2010  . Impaired glucose tolerance 01/11/2011  . METATARSALGIA 11/08/2007   work related- no recent issues"wearing steel toe shoes at the time"  . PONV (postoperative nausea and vomiting)   . Previous back surgery 10/26/17, 05/26/18  . RECTAL BLEEDING 01/04/2008   06-18-15 no problems now.  . Tendonitis    right wrist-has soft splint in place.  . Tibialis tendinitis 05/13/2010  . 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  Marland Kitchen VENTRICULAR HYPERTROPHY, LEFT 09/23/2010  . Wears glasses     Past Surgical History:  Procedure Laterality Date  . ABDOMINAL HYSTERECTOMY  2002   TAH-LTSO-LOA  . ARTHROSCOPIC REPAIR ACL Right 10/1998   right knee   . BACK SURGERY  2019  . BREAST EXCISIONAL BIOPSY Left 2015   benign  . BREAST SURGERY Left    lumpectomy-benign 2015  . CARDIOVASCULAR STRESS TEST  2011  . CARPAL TUNNEL RELEASE  2014   left  . CHOLECYSTECTOMY  1985   open  . COLONOSCOPY    . COLONOSCOPY W/ POLYPECTOMY    . DILATION AND CURETTAGE OF UTERUS    . ELBOW ARTHROPLASTY  2014   left  . ESOPHAGOGASTRODUODENOSCOPY  04/01/1998  . KNEE ARTHROSCOPY Bilateral    meniscus tear-left.  Marland Kitchen NASAL SINUS SURGERY     s/p  . ORIF ELBOW FRACTURE  12/23/2011   Procedure: OPEN REDUCTION INTERNAL FIXATION (ORIF) ELBOW/OLECRANON FRACTURE;  Surgeon: Linna Hoff, MD;  Location: [facility];  Service: Orthopedics;  Laterality: Left;  LEFT ELBOW ORIF OF PROXIMAL OLECRANON AND RADIAL HEAD, POSSIBLE ARTHROPLASTY  . OVARIAN CYST REMOVAL    . TOTAL KNEE ARTHROPLASTY Right 06/24/2015   Procedure: RIGHT TOTAL KNEE ARTHROPLASTY;  Surgeon: Gaynelle Arabian, MD;  Location: [facility];  Service: Orthopedics;  Laterality: Right;  . TRANSFORAMINAL LUMBAR INTERBODY FUSION (TLIF) WITH PEDICLE SCREW FIXATION  1 LEVEL Left 03/03/2019   Procedure: Left Lumbar four-five Redo Microdiscectomy with transforaminal lumbar interbody fusion;  Surgeon: Erline Levine, MD;  Location: [facility];  Service: Neurosurgery;  Laterality: Left;  . UPPER GASTROINTESTINAL ENDOSCOPY    . US ECHOCARDIOGRAPHY  2011    Family History  Problem Relation Age of Onset  . Diabetes Mother   . Thyroid disease Mother        hypothyroidism  . Cancer Other        Pancreatic Cancer-Aunt  . Heart disease Father 88       cad  . Colon cancer Neg Hx   . Colon polyps Neg Hx   . Esophageal cancer Neg Hx   . Stomach cancer Neg Hx   . Rectal cancer Neg Hx     Social History    Socioeconomic History  . Marital status: Married    Spouse name: Not on file  . Number of children: Not on file  . Years of education: Not on file  . Highest education level: Not on file  Occupational History  . Not on file  Tobacco Use  . Smoking status: Never Smoker  . Smokeless tobacco: Never Used  Vaping Use  . Vaping Use: Never used  Substance and Sexual Activity  . Alcohol use: No  . Drug use: No  . Sexual activity: Not on file  Other Topics Concern  . Not on file  Social History Narrative   Pt does not get regular exercise.   Daily Caffeine Use (12 oz Coke Zero per day)   Social Determinants of Health   Financial Resource Strain: Not on file  Food Insecurity: Not on file  Transportation Needs: Not on file  Physical Activity: Not on file  Stress: Not on file  Social Connections: Not on file  Intimate Partner Violence: Not on file    Outpatient Medications Prior to Visit  Medication Sig Dispense Refill  . aspirin EC 81 MG tablet Take 81 mg by mouth daily.    . Calcium Carbonate-Vitamin D (CALTRATE 600+D PO) Take 1 tablet by mouth 2 (two) times a day.     . Fiber CHEW Chew 2 tablets by mouth every morning.     . fluticasone (FLONASE) 50 MCG/ACT nasal spray Place 1 spray into both nostrils daily as needed for allergies.     Marland Kitchen losartan (COZAAR) 25 MG tablet TAKE 1 TABLET BY MOUTH EVERY DAY 90 tablet 3  . methocarbamol (ROBAXIN) 500 MG tablet Take 1 tablet (500 mg total) by mouth every 6 (six) hours as needed for muscle spasms. 40 tablet 3  . Multiple Vitamin (MULTIVITAMIN) tablet Take 1 tablet by mouth daily.    . vitamin E 400 UNIT capsule Take 400 Units by mouth daily.     Facility-Administered Medications Prior to Visit  Medication Dose Route Frequency Provider Last Rate Last Admin  . 0.9 %  sodium chloride infusion  500 mL Intravenous Once Nelida Meuse III, MD        No Known Allergies  ROS Review of Systems  Constitutional: Negative for fatigue.   Eyes: Negative for visual disturbance.  Respiratory: Negative for cough, chest tightness, shortness of breath and wheezing.   Cardiovascular: Negative for chest pain, palpitations and leg swelling.  Neurological: Negative for dizziness, seizures, syncope, weakness, light-headedness and headaches.      Objective:    Physical Exam Constitutional:      Appearance: She is well-developed.  Eyes:     Pupils:  Pupils are equal, round, and reactive to light.  Neck:     Thyroid: No thyromegaly.     Vascular: No JVD.  Cardiovascular:     Rate and Rhythm: Normal rate and regular rhythm.     Heart sounds: No gallop.   Pulmonary:     Effort: Pulmonary effort is normal. No respiratory distress.     Breath sounds: Normal breath sounds. No wheezing or rales.  Musculoskeletal:     Cervical back: Neck supple.  Neurological:     Mental Status: She is alert.     BP 128/76   Pulse 74   Temp 97.7 F (36.5 C) (Oral)   Ht 5\' 6"  (1.676 m)   Wt 172 lb 7 oz (78.2 kg)   SpO2 97%   BMI 27.83 kg/m  Wt Readings from Last 3 Encounters:  12/06/20 172 lb 7 oz (78.2 kg)  07/31/20 180 lb (81.6 kg)  07/31/19 177 lb 1.6 oz (80.3 kg)     There are no preventive care reminders to display for this patient.  There are no preventive care reminders to display for this patient.  Lab Results  Component Value Date   TSH 2.04 07/31/2020   Lab Results  Component Value Date   WBC 6.8 07/31/2020   HGB 14.4 07/31/2020   HCT 43.3 07/31/2020   MCV 89.5 07/31/2020   PLT 293 07/31/2020   Lab Results  Component Value Date   NA 142 07/31/2020   K 4.7 07/31/2020   CO2 28 07/31/2020   GLUCOSE 108 (H) 07/31/2020   BUN 15 07/31/2020   CREATININE 0.66 07/31/2020   BILITOT 0.6 07/31/2020   ALKPHOS 103 07/31/2019   AST 23 07/31/2020   ALT 26 07/31/2020   PROT 7.6 07/31/2020   ALBUMIN 4.7 07/31/2019   CALCIUM 10.1 07/31/2020   ANIONGAP 9 02/28/2019   GFR 101.06 07/31/2019   Lab Results  Component  Value Date   CHOL 185 07/31/2020   Lab Results  Component Value Date   HDL 45 (L) 07/31/2020   Lab Results  Component Value Date   LDLCALC 94 07/31/2020   Lab Results  Component Value Date   TRIG 347 (H) 07/31/2020   Lab Results  Component Value Date   CHOLHDL 4.1 07/31/2020   Lab Results  Component Value Date   HGBA1C 5.9 (H) 07/31/2020      Assessment & Plan:   Hypertension.  Improved with recent weight loss.  -Continue losartan 25 mg daily -Continue to monitor blood pressure and consider bringing her cuff here to office follow-up in 6 months to recheck with ours -Continue weight loss efforts.  We have challenged her to try to get her weight below 165  No orders of the defined types were placed in this encounter.   Follow-up: Return in about 6 months (around 06/08/2021).    Carolann Littler, MD

## 2020-12-06 NOTE — Patient Instructions (Signed)
Continue to monitor blood pressure  Keep up with exercise and weight loss!  Bring your cuff with you at follow up.

## 2021-01-01 ENCOUNTER — Other Ambulatory Visit: Payer: Self-pay | Admitting: Obstetrics and Gynecology

## 2021-01-01 DIAGNOSIS — Z1231 Encounter for screening mammogram for malignant neoplasm of breast: Secondary | ICD-10-CM

## 2021-01-17 DIAGNOSIS — M6530 Trigger finger, unspecified finger: Secondary | ICD-10-CM

## 2021-01-17 HISTORY — DX: Trigger finger, unspecified finger: M65.30

## 2021-02-20 ENCOUNTER — Other Ambulatory Visit: Payer: Self-pay

## 2021-02-21 ENCOUNTER — Ambulatory Visit: Payer: BC Managed Care – PPO | Admitting: Family Medicine

## 2021-02-21 ENCOUNTER — Encounter: Payer: Self-pay | Admitting: Family Medicine

## 2021-02-21 VITALS — BP 150/70 | HR 82 | Temp 98.1°F | Wt 178.6 lb

## 2021-02-21 DIAGNOSIS — T7840XA Allergy, unspecified, initial encounter: Secondary | ICD-10-CM | POA: Diagnosis not present

## 2021-02-21 MED ORDER — TRIAMCINOLONE ACETONIDE 0.1 % EX CREA: 1.0000 "application " | TOPICAL_CREAM | Freq: Two times a day (BID) | CUTANEOUS | 1 refills | Status: DC

## 2021-02-21 NOTE — Patient Instructions (Signed)
Follow up for any fever or progressive redness

## 2021-02-21 NOTE — Progress Notes (Signed)
Established Patient Office Visit  Subjective:  Patient ID: Laura [name], female    DOB: Jan 04, 1956  Age: 65 y.o. MRN: [id number]  CC:  Chief Complaint  Patient presents with   Insect Bite    R ankle, x 3 days, very itchy and painful, size of a quarter, inflamed.     HPI Laura [name] presents for rash on her right lateral ankle region.  She first noticed this Tuesday night.  She has spent some time outside recently and initially thought there may been some sort of bite.  By Wednesday she had some pain.  She has seen some mild blistering.  She has used some topical Neosporin.  Denies any fevers or chills.  No other blistery rashes.  She noticed slightly punctate center and thought this was some sort of bite.  No necrosis.  No fever.  No recent tick bites.  Past Medical History:  Diagnosis Date   ABNORMAL ELECTROCARDIOGRAM 08/05/2010   02-19-15 EKG shows NSR. Pt voices no issues with heart related problems.   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   GERD 01/25/2008   uses Prevacid as needed   HYPERGLYCEMIA    HYPERTENSION 08/05/2010   Impaired glucose tolerance 01/11/2011   METATARSALGIA 11/08/2007   work related- no recent issues"wearing steel toe shoes at the time"   PONV (postoperative nausea and vomiting)    Previous back surgery 10/26/17, 05/26/18   RECTAL BLEEDING 01/04/2008   06-18-15 no problems now.   Tendonitis    right wrist-has soft splint in place.   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   VENTRICULAR HYPERTROPHY, LEFT 09/23/2010   Wears glasses     Past Surgical History:  Procedure  Laterality Date   ABDOMINAL HYSTERECTOMY  2002   TAH-LTSO-LOA   ARTHROSCOPIC REPAIR ACL Right 10/1998   right knee    BACK SURGERY  2019   BREAST EXCISIONAL BIOPSY Left 2015   benign   BREAST SURGERY Left    lumpectomy-benign 2015   CARDIOVASCULAR STRESS TEST  2011   CARPAL TUNNEL RELEASE  2014   left   CHOLECYSTECTOMY  1985   open   COLONOSCOPY     COLONOSCOPY W/ POLYPECTOMY     DILATION AND CURETTAGE OF UTERUS     ELBOW ARTHROPLASTY  2014   left   ESOPHAGOGASTRODUODENOSCOPY  04/01/1998   KNEE ARTHROSCOPY Bilateral    meniscus tear-left.   NASAL SINUS SURGERY     s/p   ORIF ELBOW FRACTURE  12/23/2011   Procedure: OPEN REDUCTION INTERNAL FIXATION (ORIF) ELBOW/OLECRANON FRACTURE;  Surgeon: Linna Hoff, MD;  Location: [place];  Service: Orthopedics;  Laterality: Left;  LEFT ELBOW ORIF OF PROXIMAL OLECRANON AND RADIAL HEAD, POSSIBLE ARTHROPLASTY   OVARIAN CYST REMOVAL     TOTAL KNEE ARTHROPLASTY Right 06/24/2015   Procedure: RIGHT TOTAL KNEE ARTHROPLASTY;  Surgeon: Gaynelle Arabian, MD;  Location: [facility];  Service: Orthopedics;  Laterality: Right;   TRANSFORAMINAL LUMBAR INTERBODY FUSION (TLIF) WITH PEDICLE SCREW FIXATION 1 LEVEL Left 03/03/2019   Procedure: Left  Lumbar four-five Redo Microdiscectomy with transforaminal lumbar interbody fusion;  Surgeon: Erline Levine, MD;  Location: [place];  Service: Neurosurgery;  Laterality: Left;   UPPER GASTROINTESTINAL ENDOSCOPY     US ECHOCARDIOGRAPHY  2011    Family History  Problem Relation Age of Onset   Diabetes Mother    Thyroid disease Mother        hypothyroidism   Cancer Other        Pancreatic Cancer-Aunt   Heart disease Father 74       cad   Colon cancer Neg Hx    Colon polyps Neg Hx    Esophageal cancer Neg Hx    Stomach cancer Neg Hx    Rectal cancer Neg Hx     Social History   Socioeconomic History   Marital status: Married    Spouse name: Not on file   Number of children: Not on file   Years of education: Not on  file   Highest education level: Not on file  Occupational History   Not on file  Tobacco Use   Smoking status: Never   Smokeless tobacco: Never  Vaping Use   Vaping Use: Never used  Substance and Sexual Activity   Alcohol use: No   Drug use: No   Sexual activity: Not on file  Other Topics Concern   Not on file  Social History Narrative   Pt does not get regular exercise.   Daily Caffeine Use (12 oz Coke Zero per day)   Social Determinants of Health   Financial Resource Strain: Not on file  Food Insecurity: Not on file  Transportation Needs: Not on file  Physical Activity: Not on file  Stress: Not on file  Social Connections: Not on file  Intimate Partner Violence: Not on file    Outpatient Medications Prior to Visit  Medication Sig Dispense Refill   aspirin EC 81 MG tablet Take 81 mg by mouth daily.     Calcium Carbonate-Vitamin D (CALTRATE 600+D PO) Take 1 tablet by mouth 2 (two) times a day.      Fiber CHEW Chew 2 tablets by mouth every morning.      fluticasone (FLONASE) 50 MCG/ACT nasal spray Place 1 spray into both nostrils daily as needed for allergies.      losartan (COZAAR) 25 MG tablet TAKE 1 TABLET BY MOUTH EVERY DAY 90 tablet 3   methocarbamol (ROBAXIN) 500 MG tablet Take 1 tablet (500 mg total) by mouth every 6 (six) hours as needed for muscle spasms. 40 tablet 3   Multiple Vitamin (MULTIVITAMIN) tablet Take 1 tablet by mouth daily.     vitamin E 400 UNIT capsule Take 400 Units by mouth daily.     Facility-Administered Medications Prior to Visit  Medication Dose Route Frequency Provider Last Rate Last Admin   0.9 %  sodium chloride infusion  500 mL Intravenous Once Nelida Meuse III, MD        No Known Allergies  ROS Review of Systems  Constitutional:  Negative for chills and fever.  Skin:  Positive for rash.     Objective:    Physical Exam Vitals reviewed.  Constitutional:      Appearance: Normal appearance.  Cardiovascular:     Rate and  Rhythm: Normal rate and regular rhythm.  Pulmonary:     Effort: Pulmonary effort is normal.     Breath sounds: Normal breath sounds.  Skin:    Comments: Somewhat circumferential area of rash about 2  cm diameter right lateral ankle region.  There is a posterior aspect she has a couple small vesicles.  No pustules.  Nonscaly.  No warmth.  Nontender to palpation.  Neurological:     Mental Status: She is alert.    BP (!) 150/70 (BP Location: Left Arm, Patient Position: Sitting, Cuff Size: Normal)   Pulse 82   Temp 98.1 F (36.7 C) (Oral)   Wt 178 lb 9.6 oz (81 kg)   SpO2 97%   BMI 28.83 kg/m  Wt Readings from Last 3 Encounters:  02/21/21 178 lb 9.6 oz (81 kg)  12/06/20 172 lb 7 oz (78.2 kg)  07/31/20 180 lb (81.6 kg)     Health Maintenance Due  Topic Date Due   Zoster Vaccines- Shingrix (1 of 2) Never done   COVID-19 Vaccine (4 - Booster for Pfizer series) 01/17/2021    There are no preventive care reminders to display for this patient.  Lab Results  Component Value Date   TSH 2.04 07/31/2020   Lab Results  Component Value Date   WBC 6.8 07/31/2020   HGB 14.4 07/31/2020   HCT 43.3 07/31/2020   MCV 89.5 07/31/2020   PLT 293 07/31/2020   Lab Results  Component Value Date   NA 142 07/31/2020   K 4.7 07/31/2020   CO2 28 07/31/2020   GLUCOSE 108 (H) 07/31/2020   BUN 15 07/31/2020   CREATININE 0.66 07/31/2020   BILITOT 0.6 07/31/2020   ALKPHOS 103 07/31/2019   AST 23 07/31/2020   ALT 26 07/31/2020   PROT 7.6 07/31/2020   ALBUMIN 4.7 07/31/2019   CALCIUM 10.1 07/31/2020   ANIONGAP 9 02/28/2019   GFR 101.06 07/31/2019   Lab Results  Component Value Date   CHOL 185 07/31/2020   Lab Results  Component Value Date   HDL 45 (L) 07/31/2020   Lab Results  Component Value Date   LDLCALC 94 07/31/2020   Lab Results  Component Value Date   TRIG 347 (H) 07/31/2020   Lab Results  Component Value Date   CHOLHDL 4.1 07/31/2020   Lab Results  Component Value  Date   HGBA1C 5.9 (H) 07/31/2020      Assessment & Plan:   Skin rash.  Suspect this may be local allergic reaction from some sort of bite.  She has 1 very small punctate area near the center but no visible foreign bodies and no palpated foreign bodies.  Doubt shingles.  -Leave off Neosporin -Recommend trial of triamcinolone 0.1% cream twice daily as needed for itching -Follow-up for any fever or any progressive erythema or other concerns  Meds ordered this encounter  Medications   triamcinolone cream (KENALOG) 0.1 %    Sig: Apply 1 application topically 2 (two) times daily.    Dispense:  15 g    Refill:  1    Follow-up: No follow-ups on file.    Carolann Littler, MD

## 2021-02-24 ENCOUNTER — Ambulatory Visit: Payer: BC Managed Care – PPO | Admitting: Family Medicine

## 2021-02-24 ENCOUNTER — Encounter: Payer: Self-pay | Admitting: Family Medicine

## 2021-02-24 ENCOUNTER — Other Ambulatory Visit: Payer: Self-pay

## 2021-02-24 VITALS — BP 142/70 | HR 72 | Temp 98.0°F | Wt 174.9 lb

## 2021-02-24 DIAGNOSIS — T7840XA Allergy, unspecified, initial encounter: Secondary | ICD-10-CM

## 2021-02-24 MED ORDER — METHYLPREDNISOLONE ACETATE 80 MG/ML IJ SUSP
80.0000 mg | Freq: Once | INTRAMUSCULAR | Status: AC
  Administered 2021-02-24: 12:00:00 80 mg via INTRAMUSCULAR

## 2021-02-24 NOTE — Patient Instructions (Signed)
Please let me know if redness not improving over the next couple of days or if any fever or increased warmth.

## 2021-02-24 NOTE — Progress Notes (Signed)
Established Patient Office Visit  Subjective:  Patient ID: Laura [name], female    DOB: 1956/09/08  Age: 65 y.o. MRN: [id number]  CC:  Chief Complaint  Patient presents with   Follow-up    Bug bite on ankle    HPI Laura [name] presents for follow-up from rash on her right lateral ankle region.  Refer to note from the 10th.  She first noticed rash last Tuesday night.  She had been outdoors and thought she had some sort of bite.  She has some mild blistering.  She is Neosporin once.  We prescribed triamcinolone cream which did not seem to help.  No fevers or chills.  She was concerned because over the weekend the seem to become slightly more erythematous and blistering seemed to be spreading slightly.  Past Medical History:  Diagnosis Date   ABNORMAL ELECTROCARDIOGRAM 08/05/2010   02-19-15 EKG shows NSR. Pt voices no issues with heart related problems.   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   GERD 01/25/2008   uses Prevacid as needed   HYPERGLYCEMIA    HYPERTENSION 08/05/2010   Impaired glucose tolerance 01/11/2011   METATARSALGIA 11/08/2007   work related- no recent issues"wearing steel toe shoes at the time"   PONV (postoperative nausea and vomiting)    Previous back surgery 10/26/17, 05/26/18   RECTAL BLEEDING 01/04/2008   06-18-15 no problems now.   Tendonitis    right wrist-has soft splint in place.   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   VENTRICULAR HYPERTROPHY, LEFT 09/23/2010   Wears glasses     Past Surgical History:  Procedure Laterality Date   ABDOMINAL HYSTERECTOMY  2002    TAH-LTSO-LOA   ARTHROSCOPIC REPAIR ACL Right 10/1998   right knee    BACK SURGERY  2019   BREAST EXCISIONAL BIOPSY Left 2015   benign   BREAST SURGERY Left    lumpectomy-benign 2015   CARDIOVASCULAR STRESS TEST  2011   CARPAL TUNNEL RELEASE  2014   left   CHOLECYSTECTOMY  1985   open   COLONOSCOPY     COLONOSCOPY W/ POLYPECTOMY     DILATION AND CURETTAGE OF UTERUS     ELBOW ARTHROPLASTY  2014   left   ESOPHAGOGASTRODUODENOSCOPY  04/01/1998   KNEE ARTHROSCOPY Bilateral    meniscus tear-left.   NASAL SINUS SURGERY     s/p   ORIF ELBOW FRACTURE  12/23/2011   Procedure: OPEN REDUCTION INTERNAL FIXATION (ORIF) ELBOW/OLECRANON FRACTURE;  Surgeon: Linna Hoff, MD;  Location: [place];  Service: Orthopedics;  Laterality: Left;  LEFT ELBOW ORIF OF PROXIMAL OLECRANON AND RADIAL HEAD, POSSIBLE ARTHROPLASTY   OVARIAN CYST REMOVAL     TOTAL KNEE ARTHROPLASTY Right 06/24/2015   Procedure: RIGHT TOTAL KNEE ARTHROPLASTY;  Surgeon: Gaynelle Arabian, MD;  Location: [facility];  Service: Orthopedics;  Laterality: Right;   TRANSFORAMINAL LUMBAR INTERBODY FUSION (TLIF) WITH PEDICLE SCREW FIXATION 1 LEVEL Left 03/03/2019   Procedure: Left Lumbar four-five Redo Microdiscectomy with transforaminal lumbar interbody fusion;  Surgeon: Erline Levine, MD;  Location: [facility];  Service: Neurosurgery;  Laterality: Left;   UPPER GASTROINTESTINAL ENDOSCOPY     US ECHOCARDIOGRAPHY  2011    Family History  Problem Relation Age of Onset   Diabetes Mother    Thyroid disease Mother        hypothyroidism   Cancer Other        Pancreatic Cancer-Aunt   Heart disease Father 17       cad   Colon cancer Neg Hx    Colon polyps Neg Hx    Esophageal cancer Neg Hx    Stomach cancer Neg Hx    Rectal cancer Neg Hx     Social History   Socioeconomic History   Marital status: Married    Spouse name: Not on file   Number of children: Not on file   Years of education: Not on file   Highest education level: Not on file   Occupational History   Not on file  Tobacco Use   Smoking status: Never   Smokeless tobacco: Never  Vaping Use   Vaping Use: Never used  Substance and Sexual Activity   Alcohol use: No   Drug use: No   Sexual activity: Not on file  Other Topics Concern   Not on file  Social History Narrative   Pt does not get regular exercise.   Daily Caffeine Use (12 oz Coke Zero per day)   Social Determinants of Health   Financial Resource Strain: Not on file  Food Insecurity: Not on file  Transportation Needs: Not on file  Physical Activity: Not on file  Stress: Not on file  Social Connections: Not on file  Intimate Partner Violence: Not on file    Outpatient Medications Prior to Visit  Medication Sig Dispense Refill   aspirin EC 81 MG tablet Take 81 mg by mouth daily.     Calcium Carbonate-Vitamin D (CALTRATE 600+D PO) Take 1 tablet by mouth 2 (two) times a day.      Fiber CHEW Chew 2 tablets by mouth every morning.      fluticasone (FLONASE) 50 MCG/ACT nasal spray Place 1 spray into both nostrils daily as needed for allergies.      losartan (COZAAR) 25 MG tablet TAKE 1 TABLET BY MOUTH EVERY DAY 90 tablet 3   methocarbamol (ROBAXIN) 500 MG tablet Take 1 tablet (500 mg total) by mouth every 6 (six) hours as needed for muscle spasms. 40 tablet 3   Multiple Vitamin (MULTIVITAMIN) tablet Take 1 tablet by mouth daily.     triamcinolone cream (KENALOG) 0.1 % Apply 1 application topically 2 (two) times daily. 15 g 1   vitamin E 400 UNIT capsule Take 400 Units by mouth daily.     Facility-Administered Medications Prior to Visit  Medication Dose Route Frequency Provider Last Rate Last Admin   0.9 %  sodium chloride infusion  500 mL Intravenous Once Nelida Meuse III, MD        No Known Allergies  ROS Review of Systems  Constitutional:  Negative for chills and fever.  Skin:  Positive for rash.  Hematological:  Negative for adenopathy.     Objective:    Physical Exam Vitals  reviewed.  Constitutional:      Appearance: Normal appearance.  Cardiovascular:     Rate and Rhythm: Normal rate.  Skin:    Findings: Rash present.     Comments: She has approximately 2 x 2 centimeter area of mild erythema with some vesiculation  on the posterior aspect.  No warmth.  Nontender to palpation.  No necrosis.  No pustular changes.  Neurological:     Mental Status: She is alert.    BP (!) 142/70 (BP Location: Left Arm, Patient Position: Sitting, Cuff Size: Normal)   Pulse 72   Temp 98 F (36.7 C) (Oral)   Wt 174 lb 14.4 oz (79.3 kg)   SpO2 99%   BMI 28.23 kg/m  Wt Readings from Last 3 Encounters:  02/24/21 174 lb 14.4 oz (79.3 kg)  02/21/21 178 lb 9.6 oz (81 kg)  12/06/20 172 lb 7 oz (78.2 kg)     Health Maintenance Due  Topic Date Due   Zoster Vaccines- Shingrix (1 of 2) Never done   COVID-19 Vaccine (4 - Booster for Pfizer series) 01/17/2021    There are no preventive care reminders to display for this patient.  Lab Results  Component Value Date   TSH 2.04 07/31/2020   Lab Results  Component Value Date   WBC 6.8 07/31/2020   HGB 14.4 07/31/2020   HCT 43.3 07/31/2020   MCV 89.5 07/31/2020   PLT 293 07/31/2020   Lab Results  Component Value Date   NA 142 07/31/2020   K 4.7 07/31/2020   CO2 28 07/31/2020   GLUCOSE 108 (H) 07/31/2020   BUN 15 07/31/2020   CREATININE 0.66 07/31/2020   BILITOT 0.6 07/31/2020   ALKPHOS 103 07/31/2019   AST 23 07/31/2020   ALT 26 07/31/2020   PROT 7.6 07/31/2020   ALBUMIN 4.7 07/31/2019   CALCIUM 10.1 07/31/2020   ANIONGAP 9 02/28/2019   GFR 101.06 07/31/2019   Lab Results  Component Value Date   CHOL 185 07/31/2020   Lab Results  Component Value Date   HDL 45 (L) 07/31/2020   Lab Results  Component Value Date   LDLCALC 94 07/31/2020   Lab Results  Component Value Date   TRIG 347 (H) 07/31/2020   Lab Results  Component Value Date   CHOLHDL 4.1 07/31/2020   Lab Results  Component Value Date    HGBA1C 5.9 (H) 07/31/2020      Assessment & Plan:   Problem List Items Addressed This Visit   None Visit Diagnoses     Allergic rash present on examination    -  Primary   Relevant Medications   methylPREDNISolone acetate (DEPO-MEDROL) injection 80 mg (Completed) (Start on 02/24/2021 12:15 PM)     Patient has persistent rash right lateral ankle region.  Slightly punctate center which suggest possible sting or bite reaction.  Do not see evidence for shingles and doubt cellulitis.  -Depo-Medrol 80 mg IM given.  Follow-up for any progressive redness, warmth, or other changes.  Meds ordered this encounter  Medications   methylPREDNISolone acetate (DEPO-MEDROL) injection 80 mg    Follow-up: No follow-ups on file.    Carolann Littler, MD

## 2021-02-26 ENCOUNTER — Telehealth: Payer: Self-pay | Admitting: Family Medicine

## 2021-02-26 MED ORDER — VALACYCLOVIR HCL 1 G PO TABS: 1000.0000 mg | ORAL_TABLET | Freq: Three times a day (TID) | ORAL | 0 refills | Status: DC

## 2021-02-26 NOTE — Telephone Encounter (Signed)
Patient is calling back and stated that she was just seen for rash and had an injection but nothing is helping. Per patient she still has blisters and the rash is painful. Pt wanted to see what the provider recommends, please advise. CB is [phone]

## 2021-02-26 NOTE — Telephone Encounter (Signed)
Spoke with patient.  She still has some painful blisters but no progression and erythema.  She is concerned mostly because of location and pain with things like putting on her shoe.  Although this did not look like classic shingles we decided to go and cover with Valtrex 1 g 3 times daily for 7 days.  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  We did mention that painful blisters could be either shingles or potentially herpes.  Either way we will go ahead and cover with the Valtrex.

## 2021-02-27 NOTE — Telephone Encounter (Signed)
I spoke with patient at length yesterday (Wednesday).   No progression in redness.   Blisters still very painful.   Decided to start Valtrex 1 gram po tid for 7 days.

## 2021-06-09 ENCOUNTER — Ambulatory Visit: Payer: BC Managed Care – PPO | Admitting: Family Medicine

## 2021-07-18 ENCOUNTER — Ambulatory Visit
Admission: RE | Admit: 2021-07-18 | Discharge: 2021-07-18 | Disposition: A | Discharge status: Home / Self Care | Payer: BC Managed Care – PPO | Source: Ambulatory Visit | Attending: Obstetrics and Gynecology | Admitting: Obstetrics and Gynecology

## 2021-07-18 ENCOUNTER — Other Ambulatory Visit: Payer: Self-pay

## 2021-07-18 DIAGNOSIS — Z1231 Encounter for screening mammogram for malignant neoplasm of breast: Secondary | ICD-10-CM

## 2021-07-21 ENCOUNTER — Other Ambulatory Visit: Payer: Self-pay | Admitting: Obstetrics and Gynecology

## 2021-07-21 DIAGNOSIS — R928 Other abnormal and inconclusive findings on diagnostic imaging of breast: Secondary | ICD-10-CM

## 2021-08-05 ENCOUNTER — Other Ambulatory Visit: Payer: Self-pay | Admitting: Obstetrics and Gynecology

## 2021-08-05 ENCOUNTER — Other Ambulatory Visit: Payer: Self-pay

## 2021-08-05 ENCOUNTER — Ambulatory Visit (INDEPENDENT_AMBULATORY_CARE_PROVIDER_SITE_OTHER): Payer: BC Managed Care – PPO | Admitting: Family Medicine

## 2021-08-05 ENCOUNTER — Ambulatory Visit
Admission: RE | Admit: 2021-08-05 | Discharge: 2021-08-05 | Disposition: A | Discharge status: Home / Self Care | Payer: BC Managed Care – PPO | Source: Ambulatory Visit | Attending: Obstetrics and Gynecology | Admitting: Obstetrics and Gynecology

## 2021-08-05 ENCOUNTER — Ambulatory Visit: Payer: BC Managed Care – PPO

## 2021-08-05 VITALS — BP 140/70 | HR 76 | Temp 97.7°F | Ht 66.0 in | Wt 174.6 lb

## 2021-08-05 DIAGNOSIS — R928 Other abnormal and inconclusive findings on diagnostic imaging of breast: Secondary | ICD-10-CM

## 2021-08-05 DIAGNOSIS — Z0000 Encounter for general adult medical examination without abnormal findings: Secondary | ICD-10-CM | POA: Diagnosis not present

## 2021-08-05 DIAGNOSIS — Z23 Encounter for immunization: Secondary | ICD-10-CM

## 2021-08-05 LAB — CBC WITH DIFFERENTIAL/PLATELET
Basophils Absolute: 0 10*3/uL (ref 0.0–0.1)
Basophils Relative: 0.5 % (ref 0.0–3.0)
Eosinophils Absolute: 0.1 10*3/uL (ref 0.0–0.7)
Eosinophils Relative: 1 % (ref 0.0–5.0)
HCT: 42.7 % (ref 36.0–46.0)
Hemoglobin: 14.2 g/dL (ref 12.0–15.0)
Lymphocytes Relative: 38.7 % (ref 12.0–46.0)
Lymphs Abs: 3.4 10*3/uL (ref 0.7–4.0)
MCHC: 33.1 g/dL (ref 30.0–36.0)
MCV: 89.6 fl (ref 78.0–100.0)
Monocytes Absolute: 0.5 10*3/uL (ref 0.1–1.0)
Monocytes Relative: 5.6 % (ref 3.0–12.0)
Neutro Abs: 4.7 10*3/uL (ref 1.4–7.7)
Neutrophils Relative %: 54.2 % (ref 43.0–77.0)
Platelets: 289 10*3/uL (ref 150.0–400.0)
RBC: 4.77 Mil/uL (ref 3.87–5.11)
RDW: 13.4 % (ref 11.5–15.5)
WBC: 8.7 10*3/uL (ref 4.0–10.5)

## 2021-08-05 LAB — HEPATIC FUNCTION PANEL
ALT: 21 U/L (ref 0–35)
AST: 21 U/L (ref 0–37)
Albumin: 4.9 g/dL (ref 3.5–5.2)
Alkaline Phosphatase: 102 U/L (ref 39–117)
Bilirubin, Direct: 0.1 mg/dL (ref 0.0–0.3)
Total Bilirubin: 0.6 mg/dL (ref 0.2–1.2)
Total Protein: 8 g/dL (ref 6.0–8.3)

## 2021-08-05 LAB — BASIC METABOLIC PANEL
BUN: 17 mg/dL (ref 6–23)
CO2: 27 mEq/L (ref 19–32)
Calcium: 9.5 mg/dL (ref 8.4–10.5)
Chloride: 103 mEq/L (ref 96–112)
Creatinine, Ser: 0.68 mg/dL (ref 0.40–1.20)
GFR: 91.83 mL/min (ref >60.00)
Glucose, Bld: 103 mg/dL — ABNORMAL HIGH (ref 70–99)
Potassium: 4 mEq/L (ref 3.5–5.1)
Sodium: 138 mEq/L (ref 135–145)

## 2021-08-05 LAB — HEMOGLOBIN A1C: Hgb A1c MFr Bld: 6 % (ref 4.6–6.5)

## 2021-08-05 LAB — LIPID PANEL
Cholesterol: 178 mg/dL (ref 0–200)
HDL: 54.1 mg/dL (ref >39.00)
LDL Cholesterol: 88 mg/dL (ref 0–99)
NonHDL: 124.21
Total CHOL/HDL Ratio: 3
Triglycerides: 180 mg/dL — ABNORMAL HIGH (ref 0.0–149.0)
VLDL: 36 mg/dL (ref 0.0–40.0)

## 2021-08-05 LAB — TSH: TSH: 2.14 u[IU]/mL (ref 0.35–5.50)

## 2021-08-05 NOTE — Progress Notes (Signed)
Established Patient Office Visit  Subjective:  Patient ID: Laura [name], female    DOB: Dec 22, 1955  Age: 65 y.o. MRN: [id number]  CC:  Chief Complaint  Patient presents with   Annual Exam    HPI Laura [name] presents for physical exam.  She still sees gynecologist.  She has had prior hysterectomy.  She has history of hypertension treated with low-dose losartan.  Home blood pressures consistently around 130/70.  She has history of GERD and prior history of esophageal stricture.  She has hyperlipidemia and actually has metabolic syndrome but declined statin use.  Recent calculated 10-year risk for CAD around 8.4%.  Health maintenance reviewed  -Previous hepatitis C screen negative -Colonoscopy due 2029 -Tetanus due 2025 -Just had mammogram few weeks ago -She declines Shingrix.  She has had Zostavax previously. -Needs flu vaccine and does consent  Family history-mother is 4 years old and has history of 2 diabetes.  Father is deceased.  He had heart disease late 57s.  She has a brother who is a smoker but has no active medical problems.  Social history-married.  1 son.  He is not married.  No grandchildren.  She retired 3 years ago.  Never smoked.  No alcohol.  Husband also retired.  Past Medical History:  Diagnosis Date   ABNORMAL ELECTROCARDIOGRAM 08/05/2010   02-19-15 EKG shows NSR. Pt voices no issues with heart related problems.   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   GERD 01/25/2008   uses Prevacid as needed   HYPERGLYCEMIA    HYPERTENSION 08/05/2010   Impaired glucose tolerance 01/11/2011   METATARSALGIA 11/08/2007   work related- no recent issues"wearing steel toe shoes at the time"   PONV (postoperative nausea  and vomiting)    Previous back surgery 10/26/17, 05/26/18   RECTAL BLEEDING 01/04/2008   06-18-15 no problems now.   Tendonitis    right wrist-has soft splint in place.   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   VENTRICULAR HYPERTROPHY, LEFT 09/23/2010   Wears glasses     Past Surgical History:  Procedure Laterality Date   ABDOMINAL HYSTERECTOMY  2002   TAH-LTSO-LOA   ARTHROSCOPIC REPAIR ACL Right 10/1998   right knee    BACK SURGERY  2019   BREAST EXCISIONAL BIOPSY Left 2015   benign   BREAST SURGERY Left    lumpectomy-benign 2015   CARDIOVASCULAR STRESS TEST  2011   CARPAL TUNNEL RELEASE  2014   left   CHOLECYSTECTOMY  1985   open   COLONOSCOPY     COLONOSCOPY W/ POLYPECTOMY     DILATION AND CURETTAGE OF UTERUS     ELBOW ARTHROPLASTY  2014   left   ESOPHAGOGASTRODUODENOSCOPY  04/01/1998   KNEE ARTHROSCOPY Bilateral    meniscus tear-left.   NASAL SINUS SURGERY     s/p   ORIF ELBOW FRACTURE  12/23/2011   Procedure: OPEN REDUCTION INTERNAL FIXATION (ORIF) ELBOW/OLECRANON FRACTURE;  Surgeon: Linna Hoff, MD;  Location: [facility];  Service: Orthopedics;  Laterality: Left;  LEFT ELBOW ORIF  OF PROXIMAL OLECRANON AND RADIAL HEAD, POSSIBLE ARTHROPLASTY   OVARIAN CYST REMOVAL     TOTAL KNEE ARTHROPLASTY Right 06/24/2015   Procedure: RIGHT TOTAL KNEE ARTHROPLASTY;  Surgeon: Gaynelle Arabian, MD;  Location: [facility];  Service: Orthopedics;  Laterality: Right;   TRANSFORAMINAL LUMBAR INTERBODY FUSION (TLIF) WITH PEDICLE SCREW FIXATION 1 LEVEL Left 03/03/2019   Procedure: Left Lumbar four-five Redo Microdiscectomy with transforaminal lumbar interbody fusion;  Surgeon: Erline Levine, MD;  Location: [facility];  Service: Neurosurgery;  Laterality: Left;   UPPER GASTROINTESTINAL ENDOSCOPY     US ECHOCARDIOGRAPHY  2011    Family History  Problem Relation Age of Onset    Diabetes Mother    Thyroid disease Mother        hypothyroidism   Cancer Other        Pancreatic Cancer-Aunt   Heart disease Father 4       cad   Colon cancer Neg Hx    Colon polyps Neg Hx    Esophageal cancer Neg Hx    Stomach cancer Neg Hx    Rectal cancer Neg Hx     Social History   Socioeconomic History   Marital status: Married    Spouse name: Not on file   Number of children: Not on file   Years of education: Not on file   Highest education level: Not on file  Occupational History   Not on file  Tobacco Use   Smoking status: Never   Smokeless tobacco: Never  Vaping Use   Vaping Use: Never used  Substance and Sexual Activity   Alcohol use: No   Drug use: No   Sexual activity: Not on file  Other Topics Concern   Not on file  Social History Narrative   Pt does not get regular exercise.   Daily Caffeine Use (12 oz Coke Zero per day)   Social Determinants of Health   Financial Resource Strain: Not on file  Food Insecurity: Not on file  Transportation Needs: Not on file  Physical Activity: Not on file  Stress: Not on file  Social Connections: Not on file  Intimate Partner Violence: Not on file    Outpatient Medications Prior to Visit  Medication Sig Dispense Refill   aspirin EC 81 MG tablet Take 81 mg by mouth daily.     Calcium Carbonate-Vitamin D (CALTRATE 600+D PO) Take 1 tablet by mouth 2 (two) times a day.      Fiber CHEW Chew 2 tablets by mouth every morning.      fluticasone (FLONASE) 50 MCG/ACT nasal spray Place 1 spray into both nostrils daily as needed for allergies.      losartan (COZAAR) 25 MG tablet TAKE 1 TABLET BY MOUTH EVERY DAY 90 tablet 3   Multiple Vitamin (MULTIVITAMIN) tablet Take 1 tablet by mouth daily.     triamcinolone cream (KENALOG) 0.1 % Apply 1 application topically 2 (two) times daily. 15 g 1   vitamin E 400 UNIT capsule Take 400 Units by mouth daily.     methocarbamol (ROBAXIN) 500 MG tablet Take 1 tablet (500 mg total) by  mouth every 6 (six) hours as needed for muscle spasms. 40 tablet 3   valACYclovir (VALTREX) 1000 MG tablet Take 1 tablet (1,000 mg total) by mouth 3 (three) times daily. 21 tablet 0   Facility-Administered Medications Prior to Visit  Medication Dose Route Frequency Provider Last Rate Last Admin   0.9 %  sodium chloride infusion  500 mL Intravenous Once  Doran Stabler, MD        No Known Allergies  ROS Review of Systems  Constitutional:  Negative for activity change, appetite change, fatigue, fever and unexpected weight change.  HENT:  Negative for ear pain, hearing loss, sore throat and trouble swallowing.   Eyes:  Negative for visual disturbance.  Respiratory:  Negative for cough and shortness of breath.   Cardiovascular:  Negative for chest pain and palpitations.  Gastrointestinal:  Negative for abdominal pain, blood in stool, constipation and diarrhea.  Genitourinary:  Negative for dysuria and hematuria.  Musculoskeletal:  Negative for arthralgias, back pain and myalgias.  Skin:  Negative for rash.  Neurological:  Negative for dizziness, syncope and headaches.  Hematological:  Negative for adenopathy.  Psychiatric/Behavioral:  Negative for confusion and dysphoric mood.      Objective:    Physical Exam Constitutional:      Appearance: She is well-developed.  HENT:     Head: Normocephalic and atraumatic.  Eyes:     Pupils: Pupils are equal, round, and reactive to light.  Neck:     Thyroid: No thyromegaly.  Cardiovascular:     Rate and Rhythm: Normal rate and regular rhythm.     Heart sounds: Normal heart sounds. No murmur heard. Pulmonary:     Effort: No respiratory distress.     Breath sounds: Normal breath sounds. No wheezing or rales.  Abdominal:     General: Bowel sounds are normal. There is no distension.     Palpations: Abdomen is soft. There is no mass.     Tenderness: There is no abdominal tenderness. There is no guarding or rebound.  Musculoskeletal:         General: Normal range of motion.     Cervical back: Normal range of motion and neck supple.  Lymphadenopathy:     Cervical: No cervical adenopathy.  Skin:    Findings: No rash.  Neurological:     Mental Status: She is alert and oriented to person, place, and time.     Cranial Nerves: No cranial nerve deficit.  Psychiatric:        Behavior: Behavior normal.        Thought Content: Thought content normal.        Judgment: Judgment normal.    BP 140/70 (BP Location: Left Arm, Patient Position: Sitting, Cuff Size: Normal)   Pulse 76   Temp 97.7 F (36.5 C) (Oral)   Ht 5\' 6"  (1.676 m)   Wt 174 lb 9.6 oz (79.2 kg)   SpO2 97%   BMI 28.18 kg/m  Wt Readings from Last 3 Encounters:  08/05/21 174 lb 9.6 oz (79.2 kg)  02/24/21 174 lb 14.4 oz (79.3 kg)  02/21/21 178 lb 9.6 oz (81 kg)     Health Maintenance Due  Topic Date Due   COVID-19 Vaccine (4 - Booster for Pfizer series) 11/14/2020   INFLUENZA VACCINE  04/14/2021   PAP SMEAR-Modifier  07/19/2021    There are no preventive care reminders to display for this patient.  Lab Results  Component Value Date   TSH 2.04 07/31/2020   Lab Results  Component Value Date   WBC 6.8 07/31/2020   HGB 14.4 07/31/2020   HCT 43.3 07/31/2020   MCV 89.5 07/31/2020   PLT 293 07/31/2020   Lab Results  Component Value Date   NA 142 07/31/2020   K 4.7 07/31/2020   CO2 28 07/31/2020   GLUCOSE 108 (H) 07/31/2020   BUN  15 07/31/2020   CREATININE 0.66 07/31/2020   BILITOT 0.6 07/31/2020   ALKPHOS 103 07/31/2019   AST 23 07/31/2020   ALT 26 07/31/2020   PROT 7.6 07/31/2020   ALBUMIN 4.7 07/31/2019   CALCIUM 10.1 07/31/2020   ANIONGAP 9 02/28/2019   GFR 101.06 07/31/2019   Lab Results  Component Value Date   CHOL 185 07/31/2020   Lab Results  Component Value Date   HDL 45 (L) 07/31/2020   Lab Results  Component Value Date   LDLCALC 94 07/31/2020   Lab Results  Component Value Date   TRIG 347 (H) 07/31/2020   Lab Results   Component Value Date   CHOLHDL 4.1 07/31/2020   Lab Results  Component Value Date   HGBA1C 5.9 (H) 07/31/2020      Assessment & Plan:   Problem List Items Addressed This Visit   None Visit Diagnoses     Physical exam    -  Primary   Relevant Orders   Basic metabolic panel   Lipid panel   CBC with Differential/Platelet   TSH   Hepatic function panel   Hemoglobin A1c      -Flu vaccine given -We discussed Shingrix and she declines at this time.  She may consider by next year -Recommend Prevnar 44 after age 35 -Check labs as above including A1c with her family history of diabetes and also she has history of prediabetes and metabolic syndrome -Discussed significance of metabolic syndrome.  Handout given.  We discussed statin use again and she declines -She plans to continue with GYN follow-up for mammograms -Continue regular weightbearing exercise.  Recommend consider DEXA scan again after 66  No orders of the defined types were placed in this encounter.   Follow-up: No follow-ups on file.    Carolann Littler, MD

## 2021-08-09 ENCOUNTER — Encounter: Payer: Self-pay | Admitting: Emergency Medicine

## 2021-08-09 ENCOUNTER — Ambulatory Visit
Admission: EM | Admit: 2021-08-09 | Discharge: 2021-08-09 | Disposition: A | Discharge status: Home / Self Care | Payer: BC Managed Care – PPO | Attending: Physician Assistant | Admitting: Physician Assistant

## 2021-08-09 ENCOUNTER — Other Ambulatory Visit: Payer: Self-pay

## 2021-08-09 DIAGNOSIS — Z20822 Contact with and (suspected) exposure to covid-19: Secondary | ICD-10-CM

## 2021-08-09 DIAGNOSIS — J069 Acute upper respiratory infection, unspecified: Secondary | ICD-10-CM

## 2021-08-09 MED ORDER — ALBUTEROL SULFATE HFA 108 (90 BASE) MCG/ACT IN AERS: 2.0000 | INHALATION_SPRAY | Freq: Four times a day (QID) | RESPIRATORY_TRACT | 2 refills | Status: DC | PRN

## 2021-08-09 MED ORDER — PROMETHAZINE-DM 6.25-15 MG/5ML PO SYRP: 5.0000 mL | ORAL_SOLUTION | Freq: Four times a day (QID) | ORAL | 0 refills | Status: DC | PRN

## 2021-08-09 NOTE — ED Triage Notes (Signed)
Cough, and chest congestion since last night.  Nausea and vomiting since last night.  Body aches, got flu shot on Tuesday.

## 2021-08-10 LAB — COVID-19, FLU A+B NAA
Influenza A, NAA: DETECTED — AB
Influenza B, NAA: NOT DETECTED
SARS-CoV-2, NAA: NOT DETECTED

## 2021-08-11 ENCOUNTER — Telehealth (HOSPITAL_COMMUNITY): Payer: Self-pay | Admitting: Emergency Medicine

## 2021-08-11 MED ORDER — OSELTAMIVIR PHOSPHATE 75 MG PO CAPS: 75.0000 mg | ORAL_CAPSULE | Freq: Two times a day (BID) | ORAL | 0 refills | Status: DC

## 2021-08-13 NOTE — ED Provider Notes (Signed)
[facility]    CSN: [id number] Arrival date & time: 08/09/21  1312      History   Chief Complaint No chief complaint on file.   HPI Laura [name] is a 65 y.o. female.   The history is provided by the patient. No language interpreter was used.  Cough Cough characteristics:  Non-productive Sputum characteristics:  Nondescript Severity:  Moderate Onset quality:  Gradual Duration:  1 day Timing:  Constant Progression:  Worsening Chronicity:  New Relieved by:  Nothing Worsened by:  Nothing Ineffective treatments:  None tried Associated symptoms: no fever   Pt had flu shot 4 days ago Past Medical History:  Diagnosis Date   ABNORMAL ELECTROCARDIOGRAM 08/05/2010   02-19-15 EKG shows NSR. Pt voices no issues with heart related problems.   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   GERD 01/25/2008   uses Prevacid as needed   HYPERGLYCEMIA    HYPERTENSION 08/05/2010   Impaired glucose tolerance 01/11/2011   METATARSALGIA 11/08/2007   work related- no recent issues"wearing steel toe shoes at the time"   PONV (postoperative nausea and vomiting)    Previous back surgery 10/26/17, 05/26/18   RECTAL BLEEDING 01/04/2008   06-18-15 no problems now.   Tendonitis    right wrist-has soft splint in place.   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   Neck swelling 09/25/2016   Fever 10/16/2015   OA  (osteoarthritis) of knee 06/24/2015   Preop cardiovascular exam 02/19/2015   UTI (urinary tract infection) 12/16/2013   Osteoporosis 10/30/2013   Menopausal state 05/18/2013   Left otitis media 02/19/2012   Routine general medical examination at a health care facility 12/02/2011   Impaired glucose tolerance 01/11/2011   Sternal fracture 12/29/2010   Preventative health care 12/26/2010   VENTRICULAR HYPERTROPHY, LEFT 09/23/2010   Essential hypertension 08/05/2010   ABNORMAL ELECTROCARDIOGRAM 08/05/2010   Tibialis tendinitis 05/13/2010   CONSTIPATION 02/21/2008   ESOPHAGEAL STRICTURE 02/17/2008   ACNE, MILD 02/17/2008   ALLERGIC RHINITIS 01/25/2008   Asthma 01/25/2008   GERD 01/25/2008   HYPERCHOLESTEROLEMIA 01/19/2008   Unspecified disorder of liver 01/19/2008   RECTAL BLEEDING 01/04/2008   METATARSALGIA 11/08/2007   Cavus deformity of foot, 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   NASAL SINUS SURGERY     s/p   ORIF ELBOW FRACTURE  12/23/2011   Procedure: OPEN REDUCTION INTERNAL FIXATION (ORIF) ELBOW/OLECRANON FRACTURE;  Surgeon: Linna Hoff, MD;  Location: [facility];  Service: Orthopedics;  Laterality: Left;  LEFT ELBOW ORIF OF PROXIMAL OLECRANON AND RADIAL HEAD, POSSIBLE ARTHROPLASTY   OVARIAN CYST REMOVAL     TOTAL KNEE ARTHROPLASTY Right 06/24/2015   Procedure: RIGHT TOTAL KNEE ARTHROPLASTY;  Surgeon: Gaynelle Arabian, MD;  Location: [facility];  Service: Orthopedics;  Laterality: Right;   TRANSFORAMINAL LUMBAR INTERBODY FUSION (TLIF) WITH PEDICLE SCREW FIXATION 1 LEVEL Left 03/03/2019   Procedure: Left Lumbar four-five Redo Microdiscectomy with transforaminal lumbar interbody fusion;  Surgeon: Erline Levine, MD;  Location: [facility];  Service: Neurosurgery;  Laterality: Left;   UPPER GASTROINTESTINAL ENDOSCOPY     US ECHOCARDIOGRAPHY  2011    OB History   No obstetric history on file.      Home Medications    Prior to Admission medications   Medication Sig Start Date End Date Taking? Authorizing Provider  albuterol (VENTOLIN HFA) 108 (90 Base) MCG/ACT inhaler Inhale 2 puffs into the lungs every 6 (six) hours as needed for wheezing or shortness of breath. 08/09/21  Yes Fransico Meadow, PA-C  promethazine-dextromethorphan (PROMETHAZINE-DM) 6.25-15 MG/5ML syrup Take 5 mLs by mouth 4 (four) times daily as needed for cough. 08/09/21  Yes Fransico Meadow, PA-C  aspirin EC 81 MG tablet Take 81 mg by mouth daily.    [provider]  Calcium Carbonate-Vitamin D (CALTRATE 600+D PO) Take 1 tablet by mouth 2 (two) times a day.     [provider]  Fiber CHEW Chew 2 tablets by mouth every morning.     [provider]  fluticasone (FLONASE) 50 MCG/ACT nasal spray Place 1 spray into both nostrils daily as needed for allergies.  10/22/16   [provider]  losartan (COZAAR) 25 MG tablet TAKE 1 TABLET BY MOUTH EVERY DAY 08/20/20   Renato Shin, MD  Multiple Vitamin (MULTIVITAMIN) tablet Take 1 tablet by mouth daily.    [provider]  oseltamivir (TAMIFLU) 75 MG capsule Take 1 capsule (75 mg total) by mouth every 12 (twelve) hours. 08/11/21   Fransico Meadow, PA-C  triamcinolone cream (KENALOG) 0.1 % Apply 1 application topically 2 (two) times daily. 02/21/21   Burchette, Alinda Sierras, MD  vitamin E 400 UNIT capsule Take 400 Units by mouth daily.    [provider]   olmesartan (BENICAR) 20 MG tablet Take 20 mg by mouth daily.    11/30/11  [provider]    Family History Family History  Problem Relation Age of Onset   Diabetes Mother    Thyroid disease Mother        hypothyroidism   Cancer Other        Pancreatic Cancer-Aunt   Heart disease Father 57       cad   Colon cancer Neg Hx    Colon polyps Neg Hx    Esophageal cancer Neg Hx    Stomach cancer Neg Hx    Rectal cancer Neg Hx     Social History Social History   Tobacco Use   Smoking status: Never   Smokeless tobacco: Never  Vaping Use   Vaping Use: Never used  Substance Use Topics  Alcohol use: No   Drug use: No     Allergies   Patient has no known allergies.   Review of Systems Review of Systems  Constitutional:  Negative for fever.  Respiratory:  Positive for cough.   All other systems reviewed and are negative.   Physical Exam Triage Vital Signs ED Triage Vitals  Enc Vitals Group     BP 08/09/21 1603 139/80     Pulse Rate 08/09/21 1603 (!) 110     Resp 08/09/21 1603 18     Temp 08/09/21 1603 99.6 F (37.6 C)     Temp Source 08/09/21 1603 Oral     SpO2 08/09/21 1603 92 %     Weight --      Height --      Head Circumference --      Peak Flow --      Pain Score 08/09/21 1604 4     Pain Loc --      Pain Edu? --      Excl. in [place]? --    No data found.  Updated Vital Signs BP 139/80 (BP Location: Right Arm)   Pulse (!) 110   Temp 99.6 F (37.6 C) (Oral)   Resp 18   SpO2 92%   Visual Acuity Right Eye Distance:   Left Eye Distance:   Bilateral Distance:    Right Eye Near:   Left Eye Near:    Bilateral Near:     Physical Exam Vitals and nursing note reviewed.  Constitutional:      Appearance: She is well-developed.  HENT:     Head: Normocephalic.     Nose: Nose normal.     Mouth/Throat:     Mouth: Mucous membranes are moist.  Cardiovascular:     Rate and Rhythm: Normal rate.  Pulmonary:     Effort: Pulmonary effort is  normal.  Abdominal:     General: There is no distension.  Musculoskeletal:        General: Normal range of motion.     Cervical back: Normal range of motion.  Neurological:     Mental Status: She is alert and oriented to person, place, and time.     UC Treatments / Results  Labs (all labs ordered are listed, but only abnormal results are displayed) Labs Reviewed  COVID-19, FLU A+B NAA - Abnormal; Notable for the following components:      Result Value   Influenza A, NAA Detected (*)    All other components within normal limits   Narrative:    Performed at:  [address], [place], [place]  [id number] Lab Director: Rush Farmer MD, Phone:  [phone]    EKG   Radiology No results found.  Procedures Procedures (including critical care time)  Medications Ordered in UC Medications - No data to display  Initial Impression / Assessment and Plan / UC Course  I have reviewed the triage vital signs and the nursing notes.  Pertinent labs & imaging results that were available during my care of the patient were reviewed by me and considered in my medical decision making (see chart for details).      Final Clinical Impressions(s) / UC Diagnoses   Final diagnoses:  Exposure to COVID-19 virus  Viral URI   Discharge Instructions   None    ED Prescriptions     Medication Sig Dispense Auth. Provider   albuterol (VENTOLIN HFA) 108 (90 Base) MCG/ACT inhaler Inhale 2 puffs  into the lungs every 6 (six) hours as needed for wheezing or shortness of breath. 8 g Bernardo Brayman K, [place]   promethazine-dextromethorphan (PROMETHAZINE-DM) 6.25-15 MG/5ML syrup Take 5 mLs by mouth 4 (four) times daily as needed for cough. 118 mL Fransico Meadow, [place]      PDMP not reviewed this encounter. An After Visit Summary was printed and given to the patient.    Fransico Meadow, [place] 08/13/21 1905

## 2021-08-15 ENCOUNTER — Ambulatory Visit (INDEPENDENT_AMBULATORY_CARE_PROVIDER_SITE_OTHER): Payer: BC Managed Care – PPO | Admitting: Family Medicine

## 2021-08-15 ENCOUNTER — Encounter: Payer: Self-pay | Admitting: Family Medicine

## 2021-08-15 ENCOUNTER — Other Ambulatory Visit: Payer: Self-pay | Admitting: Endocrinology

## 2021-08-15 VITALS — BP 150/82 | HR 97 | Temp 98.0°F | Ht 66.0 in | Wt 174.0 lb

## 2021-08-15 DIAGNOSIS — R062 Wheezing: Secondary | ICD-10-CM

## 2021-08-15 DIAGNOSIS — B001 Herpesviral vesicular dermatitis: Secondary | ICD-10-CM

## 2021-08-15 MED ORDER — VALACYCLOVIR HCL 1 G PO TABS: ORAL_TABLET | ORAL | 1 refills | Status: DC

## 2021-08-15 MED ORDER — PREDNISONE 20 MG PO TABS: ORAL_TABLET | ORAL | 0 refills | Status: DC

## 2021-08-15 NOTE — Progress Notes (Signed)
Established Patient Office Visit  Subjective:  Patient ID: Laura [name], female    DOB: [birth year]  Age: 65 y.o. MRN: [id number]  CC:  Chief Complaint  Patient presents with   Mouth Lesions    Patient complains of cold sores, x1 week,    HPI Laura [name] presents for recurrent cold sores.  She has had these in the past.  She does use Valtrex previously.  She states she had influenza last week which was diagnosed to urgent care.  She is finishing up Tamiflu.  No further fevers.  She has cold sore right lower lip but also 1 right nose which she has not had previously.  She would like refills of Valtrex.  She does have history of asthma.  She is aware of some wheezing.  She has albuterol inhaler.  She has taken prednisone in the past.  No fever.  Past Medical History:  Diagnosis Date   ABNORMAL ELECTROCARDIOGRAM 08/05/2010   02-19-15 EKG shows NSR. Pt voices no issues with heart related problems.   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   GERD 01/25/2008   uses Prevacid as needed   HYPERGLYCEMIA    HYPERTENSION 08/05/2010   Impaired glucose tolerance 01/11/2011   METATARSALGIA 11/08/2007   work related- no recent issues"wearing steel toe shoes at the time"   PONV (postoperative nausea and vomiting)    Previous back surgery 10/26/17, 05/26/18   RECTAL BLEEDING 01/04/2008   06-18-15 no problems now.   Tendonitis    right wrist-has soft splint in place.   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   VENTRICULAR HYPERTROPHY, LEFT 09/23/2010   Wears glasses     Past Surgical History:  Procedure Laterality  Date   ABDOMINAL HYSTERECTOMY  2002   TAH-LTSO-LOA   ARTHROSCOPIC REPAIR ACL Right 10/1998   right knee    BACK SURGERY  2019   BREAST EXCISIONAL BIOPSY Left 2015   benign   BREAST SURGERY Left    lumpectomy-benign 2015   CARDIOVASCULAR STRESS TEST  2011   CARPAL TUNNEL RELEASE  2014   left   CHOLECYSTECTOMY  1985   open   COLONOSCOPY     COLONOSCOPY W/ POLYPECTOMY     DILATION AND CURETTAGE OF UTERUS     ELBOW ARTHROPLASTY  2014   left   ESOPHAGOGASTRODUODENOSCOPY  04/01/1998   KNEE ARTHROSCOPY Bilateral    meniscus tear-left.   NASAL SINUS SURGERY     s/p   ORIF ELBOW FRACTURE  12/23/2011   Procedure: OPEN REDUCTION INTERNAL FIXATION (ORIF) ELBOW/OLECRANON FRACTURE;  Surgeon: Linna Hoff, MD;  Location: [facility];  Service: Orthopedics;  Laterality: Left;  LEFT ELBOW ORIF OF PROXIMAL OLECRANON AND RADIAL HEAD, POSSIBLE ARTHROPLASTY   OVARIAN CYST REMOVAL     TOTAL KNEE ARTHROPLASTY Right 06/24/2015   Procedure: RIGHT TOTAL KNEE ARTHROPLASTY;  Surgeon: Gaynelle Arabian, MD;  Location: [facility];  Service: Orthopedics;  Laterality: Right;   TRANSFORAMINAL LUMBAR INTERBODY FUSION (TLIF) WITH PEDICLE SCREW FIXATION 1 LEVEL Left 03/03/2019   Procedure: Left  Lumbar four-five Redo Microdiscectomy with transforaminal lumbar interbody fusion;  Surgeon: Erline Levine, MD;  Location: [facility];  Service: Neurosurgery;  Laterality: Left;   UPPER GASTROINTESTINAL ENDOSCOPY     US ECHOCARDIOGRAPHY  2011    Family History  Problem Relation Age of Onset   Diabetes Mother    Thyroid disease Mother        hypothyroidism   Cancer Other        Pancreatic Cancer-Aunt   Heart disease Father 18       cad   Colon cancer Neg Hx    Colon polyps Neg Hx    Esophageal cancer Neg Hx    Stomach cancer Neg Hx    Rectal cancer Neg Hx     Social History   Socioeconomic History   Marital status: Married    Spouse name: Not on file   Number of children: Not on file   Years of education: Not on file    Highest education level: Not on file  Occupational History   Not on file  Tobacco Use   Smoking status: Never   Smokeless tobacco: Never  Vaping Use   Vaping Use: Never used  Substance and Sexual Activity   Alcohol use: No   Drug use: No   Sexual activity: Not on file  Other Topics Concern   Not on file  Social History Narrative   Pt does not get regular exercise.   Daily Caffeine Use (12 oz Coke Zero per day)   Social Determinants of Health   Financial Resource Strain: Not on file  Food Insecurity: Not on file  Transportation Needs: Not on file  Physical Activity: Not on file  Stress: Not on file  Social Connections: Not on file  Intimate Partner Violence: Not on file    Outpatient Medications Prior to Visit  Medication Sig Dispense Refill   albuterol (VENTOLIN HFA) 108 (90 Base) MCG/ACT inhaler Inhale 2 puffs into the lungs every 6 (six) hours as needed for wheezing or shortness of breath. 8 g 2   aspirin EC 81 MG tablet Take 81 mg by mouth daily.     Calcium Carbonate-Vitamin D (CALTRATE 600+D PO) Take 1 tablet by mouth 2 (two) times a day.      Fiber CHEW Chew 2 tablets by mouth every morning.      fluticasone (FLONASE) 50 MCG/ACT nasal spray Place 1 spray into both nostrils daily as needed for allergies.      losartan (COZAAR) 25 MG tablet TAKE 1 TABLET BY MOUTH EVERY DAY 90 tablet 3   Multiple Vitamin (MULTIVITAMIN) tablet Take 1 tablet by mouth daily.     oseltamivir (TAMIFLU) 75 MG capsule Take 1 capsule (75 mg total) by mouth every 12 (twelve) hours. 10 capsule 0   promethazine-dextromethorphan (PROMETHAZINE-DM) 6.25-15 MG/5ML syrup Take 5 mLs by mouth 4 (four) times daily as needed for cough. 118 mL 0   triamcinolone cream (KENALOG) 0.1 % Apply 1 application topically 2 (two) times daily. 15 g 1   vitamin E 400 UNIT capsule Take 400 Units by mouth daily.     Facility-Administered Medications Prior to Visit  Medication Dose Route Frequency Provider Last Rate Last  Admin   0.9 %  sodium chloride infusion  500 mL Intravenous Once Nelida Meuse III, MD        No Known Allergies  ROS Review of Systems  Constitutional:  Negative for chills and fever.  Respiratory:  Positive for cough and wheezing.  Objective:    Physical Exam Vitals reviewed.  HENT:     Head:     Comments: She has cold sore right lower lip and also small vesicular lesion right external nose consistent with likely herpes lesion. Cardiovascular:     Rate and Rhythm: Normal rate and regular rhythm.  Pulmonary:     Comments: She has some expiratory wheezing on exam.  No retractions.  No respiratory distress. Neurological:     Mental Status: She is alert.    BP (!) 150/82 (BP Location: Left Arm, Patient Position: Sitting, Cuff Size: Normal)   Pulse 97   Temp 98 F (36.7 C) (Oral)   Ht 5\' 6"  (1.676 m)   Wt 174 lb (78.9 kg)   SpO2 97%   BMI 28.08 kg/m  Wt Readings from Last 3 Encounters:  08/15/21 174 lb (78.9 kg)  08/05/21 174 lb 9.6 oz (79.2 kg)  02/24/21 174 lb 14.4 oz (79.3 kg)     Health Maintenance Due  Topic Date Due   COVID-19 Vaccine (4 - Booster for Pfizer series) 11/14/2020   PAP SMEAR-Modifier  07/19/2021    There are no preventive care reminders to display for this patient.  Lab Results  Component Value Date   TSH 2.14 08/05/2021   Lab Results  Component Value Date   WBC 8.7 08/05/2021   HGB 14.2 08/05/2021   HCT 42.7 08/05/2021   MCV 89.6 08/05/2021   PLT 289.0 08/05/2021   Lab Results  Component Value Date   NA 138 08/05/2021   K 4.0 08/05/2021   CO2 27 08/05/2021   GLUCOSE 103 (H) 08/05/2021   BUN 17 08/05/2021   CREATININE 0.68 08/05/2021   BILITOT 0.6 08/05/2021   ALKPHOS 102 08/05/2021   AST 21 08/05/2021   ALT 21 08/05/2021   PROT 8.0 08/05/2021   ALBUMIN 4.9 08/05/2021   CALCIUM 9.5 08/05/2021   ANIONGAP 9 02/28/2019   GFR 91.83 08/05/2021   Lab Results  Component Value Date   CHOL 178 08/05/2021   Lab Results   Component Value Date   HDL 54.10 08/05/2021   Lab Results  Component Value Date   LDLCALC 88 08/05/2021   Lab Results  Component Value Date   TRIG 180.0 (H) 08/05/2021   Lab Results  Component Value Date   CHOLHDL 3 08/05/2021   Lab Results  Component Value Date   HGBA1C 6.0 08/05/2021      Assessment & Plan:   Problem List Items Addressed This Visit   None Visit Diagnoses     Recurrent cold sores    -  Primary   Relevant Medications   valACYclovir (VALTREX) 1000 MG tablet   Wheezing          -Valtrex 1 g take 2 tablets at onset of course lower and repeat 2 in 12 hours -Prednisone 20 mg take 2 tablets by mouth once daily for 5 days and continue albuterol inhaler as needed  Meds ordered this encounter  Medications   valACYclovir (VALTREX) 1000 MG tablet    Sig: Take two tablets by mouth at onset of cold sore and two tablets 12 hours later.    Dispense:  40 tablet    Refill:  1   predniSONE (DELTASONE) 20 MG tablet    Sig: Take two tablets by mouth once daily for 5 days    Dispense:  10 tablet    Refill:  0    Follow-up: No follow-ups on file.  Carolann Littler, MD

## 2021-08-19 ENCOUNTER — Other Ambulatory Visit: Payer: Self-pay | Admitting: Endocrinology

## 2021-08-20 ENCOUNTER — Ambulatory Visit
Admission: RE | Admit: 2021-08-20 | Discharge: 2021-08-20 | Disposition: A | Discharge status: Home / Self Care | Payer: BC Managed Care – PPO | Source: Ambulatory Visit | Attending: Obstetrics and Gynecology | Admitting: Obstetrics and Gynecology

## 2021-08-20 DIAGNOSIS — R928 Other abnormal and inconclusive findings on diagnostic imaging of breast: Secondary | ICD-10-CM

## 2021-08-21 ENCOUNTER — Other Ambulatory Visit: Payer: Self-pay | Admitting: Endocrinology

## 2021-08-26 ENCOUNTER — Telehealth: Payer: Self-pay | Admitting: Family Medicine

## 2021-08-26 NOTE — Telephone Encounter (Signed)
Patient called to get refill on losartan (COZAAR) 25 MG tablet   Pharmacy has been sending refill request to [place] and patient no longer sees him, pharmacy may need to be let know of this change so future request come to Dr.Burchette.   Please send to  [id number] - [place], [place] AT [facility] Phone:  [phone]  Fax:  [phone]       Please advise

## 2021-08-26 NOTE — Telephone Encounter (Signed)
Please advise. Last filled by Dr. Loanne Drilling. Ok to fill?

## 2021-08-27 MED ORDER — LOSARTAN POTASSIUM 25 MG PO TABS: ORAL_TABLET | ORAL | 3 refills | Status: DC

## 2021-08-27 NOTE — Telephone Encounter (Signed)
Rx sent in. Spoke with the patient and she is aware. 

## 2021-09-05 ENCOUNTER — Telehealth: Payer: Self-pay | Admitting: Family Medicine

## 2021-09-05 NOTE — Telephone Encounter (Signed)
Patient calling in with respiratory symptoms: Shortness of breath, chest pain, palpitations or other red words send to Triage  Does the patient have a fever over 100, cough, congestion, sore throat, runny nose, lost of taste/smell (please list symptoms that patient has)?chest congestion  What date did symptoms start? Around thanksgiving (If over 5 days ago, pt may be scheduled for in person visit)  Have you tested for Covid in the last 5 days? No   If yes, was it positive []  OR negative [] ? If positive in the last 5 days, please schedule virtual visit now. If negative, schedule for an in person OV with the next available provider if PCP has no openings. Please also let patient know they will be tested again (follow the script below)  "you will have to arrive 5mins prior to your appt time to be Covid tested. Please park in back of office at the cone & call [phone] to let the staff know you have arrived. A staff member will meet you at your car to do a rapid covid test. Once the test has resulted you will be notified by phone of your results to determine if appt will remain an in person visit or be converted to a virtual/phone visit. If you arrive less than 29mins before your appt time, your visit will be automatically converted to virtual & any recommended testing will happen AFTER the visit." Pt has an appt with dr Elease Hashimoto on 09-10-2021 830 am   If no availability for virtual visit in office,  please schedule another [place] office  If no availability at another [place] office, please instruct patient that they can schedule an evisit or virtual visit through their mychart account. Visits up to 8pm  patients can be seen in office 5 days after positive COVID test

## 2021-09-10 ENCOUNTER — Encounter: Payer: Self-pay | Admitting: Family Medicine

## 2021-09-10 ENCOUNTER — Ambulatory Visit (INDEPENDENT_AMBULATORY_CARE_PROVIDER_SITE_OTHER): Payer: BC Managed Care – PPO | Admitting: Family Medicine

## 2021-09-10 ENCOUNTER — Other Ambulatory Visit: Payer: Self-pay

## 2021-09-10 VITALS — BP 144/84 | HR 85 | Temp 97.5°F | Ht 66.0 in | Wt 177.0 lb

## 2021-09-10 DIAGNOSIS — R059 Cough, unspecified: Secondary | ICD-10-CM

## 2021-09-10 DIAGNOSIS — R062 Wheezing: Secondary | ICD-10-CM

## 2021-09-10 MED ORDER — METHYLPREDNISOLONE ACETATE 80 MG/ML IJ SUSP
80.0000 mg | Freq: Once | INTRAMUSCULAR | Status: AC
Start: 2021-09-10 — End: 2021-09-10
  Administered 2021-09-10: 09:00:00 80 mg via INTRAMUSCULAR

## 2021-09-10 MED ORDER — PREDNISONE 20 MG PO TABS: ORAL_TABLET | ORAL | 0 refills | Status: DC

## 2021-09-10 NOTE — Patient Instructions (Signed)
Let me know if cough not resolving in the next few days.

## 2021-09-10 NOTE — Progress Notes (Signed)
Established Patient Office Visit  Subjective:  Patient ID: Laura [name], female    DOB: Nov 02, 1955  Age: 65 y.o. MRN: [id number]  CC:  Chief Complaint  Patient presents with   Cough    Was seen on 12/2 and treated with prednisone. Some improvement still having some congestion and tightness in chest with non productive cough at times.     HPI Laura [name] presents for some persistent cough following respiratory illness which started the day after Thanksgiving.  She went to urgent care but been tested positive for flu and was treated with Tamiflu.  She was seen here December 2 had significant wheezing.  We placed her on 5 days of prednisone and her wheezing did improve some when she was taking the prednisone.  Her main concern is she has surgery coming up sometime in late January.  She has no fever.  No productive cough at this time.  No hemoptysis.  No dyspnea.  No appetite or weight changes.  She has rescue inhaler which helps temporarily.  Non-smoker.  Past Medical History:  Diagnosis Date   ABNORMAL ELECTROCARDIOGRAM 08/05/2010   02-19-15 EKG shows NSR. Pt voices no issues with heart related problems.   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   GERD 01/25/2008   uses Prevacid as needed   HYPERGLYCEMIA    HYPERTENSION 08/05/2010   Impaired glucose tolerance 01/11/2011   METATARSALGIA 11/08/2007   work related- no recent issues"wearing steel toe shoes at the time"   PONV (postoperative nausea and vomiting)    Previous back surgery 10/26/17, 05/26/18   RECTAL BLEEDING 01/04/2008   06-18-15 no problems now.   Tendonitis    right wrist-has soft splint in place.   Tibialis tendinitis 05/13/2010   Unspecified disorder of liver 01/19/2008    evaluated and thought to be related to pain med use at current time- no further issues noted.   VENTRICULAR HYPERTROPHY, LEFT 09/23/2010   Wears glasses     Past Surgical History:  Procedure Laterality Date   ABDOMINAL HYSTERECTOMY  2002   TAH-LTSO-LOA   ARTHROSCOPIC REPAIR ACL Right 10/1998   right knee    BACK SURGERY  2019   BREAST EXCISIONAL BIOPSY Left 2015   benign   BREAST SURGERY Left    lumpectomy-benign 2015   CARDIOVASCULAR STRESS TEST  2011   CARPAL TUNNEL RELEASE  2014   left   CHOLECYSTECTOMY  1985   open   COLONOSCOPY     COLONOSCOPY W/ POLYPECTOMY     DILATION AND CURETTAGE OF UTERUS     ELBOW ARTHROPLASTY  2014   left   ESOPHAGOGASTRODUODENOSCOPY  04/01/1998   KNEE ARTHROSCOPY Bilateral    meniscus tear-left.   NASAL SINUS SURGERY     s/p   ORIF ELBOW FRACTURE  12/23/2011   Procedure: OPEN REDUCTION INTERNAL FIXATION (ORIF) ELBOW/OLECRANON FRACTURE;  Surgeon: Linna Hoff, MD;  Location: [facility];  Service: Orthopedics;  Laterality: Left;  LEFT ELBOW ORIF OF PROXIMAL OLECRANON AND RADIAL HEAD, POSSIBLE ARTHROPLASTY   OVARIAN CYST REMOVAL     TOTAL KNEE ARTHROPLASTY Right 06/24/2015   Procedure: RIGHT TOTAL KNEE ARTHROPLASTY;  Surgeon: Gaynelle Arabian, MD;  Location: [facility];  Service: Orthopedics;  Laterality: Right;   TRANSFORAMINAL LUMBAR INTERBODY FUSION (TLIF) WITH PEDICLE SCREW FIXATION 1 LEVEL Left 03/03/2019   Procedure: Left Lumbar four-five Redo Microdiscectomy with transforaminal lumbar interbody fusion;  Surgeon: Erline Levine, MD;  Location: [facility];  Service: Neurosurgery;  Laterality: Left;   UPPER GASTROINTESTINAL ENDOSCOPY     US ECHOCARDIOGRAPHY  2011    Family History  Problem Relation Age of Onset   Diabetes Mother    Thyroid disease Mother        hypothyroidism   Cancer Other        Pancreatic Cancer-Aunt   Heart disease Father 37       cad   Colon cancer Neg Hx    Colon polyps Neg Hx    Esophageal cancer Neg Hx    Stomach cancer Neg  Hx    Rectal cancer Neg Hx     Social History   Socioeconomic History   Marital status: Married    Spouse name: Not on file   Number of children: Not on file   Years of education: Not on file   Highest education level: Not on file  Occupational History   Not on file  Tobacco Use   Smoking status: Never   Smokeless tobacco: Never  Vaping Use   Vaping Use: Never used  Substance and Sexual Activity   Alcohol use: No   Drug use: No   Sexual activity: Not on file  Other Topics Concern   Not on file  Social History Narrative   Pt does not get regular exercise.   Daily Caffeine Use (12 oz Coke Zero per day)   Social Determinants of Health   Financial Resource Strain: Not on file  Food Insecurity: Not on file  Transportation Needs: Not on file  Physical Activity: Not on file  Stress: Not on file  Social Connections: Not on file  Intimate Partner Violence: Not on file    Outpatient Medications Prior to Visit  Medication Sig Dispense Refill   albuterol (VENTOLIN HFA) 108 (90 Base) MCG/ACT inhaler Inhale 2 puffs into the lungs every 6 (six) hours as needed for wheezing or shortness of breath. 8 g 2   Ascorbic Acid (VITAMIN C) 100 MG CHEW      aspirin 81 MG chewable tablet      Calcium Carbonate-Vitamin D (CALTRATE 600+D PO) Take 1 tablet by mouth 2 (two) times a day.      Fiber CHEW Chew 2 tablets by mouth every morning.      fluticasone (FLONASE) 50 MCG/ACT nasal spray Place 1 spray into both nostrils daily as needed for allergies.      losartan (COZAAR) 25 MG tablet TAKE 1 TABLET BY MOUTH EVERY DAY 90 tablet 3   Multiple Vitamin (MULTIVITAMIN) tablet Take 1 tablet by mouth daily.     promethazine-dextromethorphan (PROMETHAZINE-DM) 6.25-15 MG/5ML syrup Take 5 mLs by mouth 4 (four) times daily as needed for cough. 118 mL 0   valACYclovir (VALTREX) 1000 MG tablet Take two tablets by mouth at onset of cold sore and two tablets 12 hours later. 40 tablet 1   vitamin E 400 UNIT  capsule Take 400 Units by mouth daily.     aspirin EC 81 MG tablet Take 81 mg by mouth daily.     benzonatate (TESSALON) 100 MG capsule      oseltamivir (TAMIFLU) 75 MG capsule Take 1 capsule (75 mg total) by mouth every 12 (twelve) hours. 10  capsule 0   predniSONE (DELTASONE) 20 MG tablet Take two tablets by mouth once daily for 5 days 10 tablet 0   triamcinolone cream (KENALOG) 0.1 % Apply 1 application topically 2 (two) times daily. 15 g 1   Facility-Administered Medications Prior to Visit  Medication Dose Route Frequency Provider Last Rate Last Admin   0.9 %  sodium chloride infusion  500 mL Intravenous Once Nelida Meuse III, MD        No Known Allergies  ROS Review of Systems  Constitutional:  Negative for chills and fever.  HENT:  Negative for sinus pressure and sinus pain.   Respiratory:  Positive for cough and wheezing. Negative for shortness of breath.   Cardiovascular:  Negative for chest pain.     Objective:    Physical Exam Vitals reviewed.  Constitutional:      Appearance: Normal appearance.  HENT:     Mouth/Throat:     Pharynx: Oropharynx is clear. No oropharyngeal exudate.  Cardiovascular:     Rate and Rhythm: Normal rate and regular rhythm.  Pulmonary:     Effort: Pulmonary effort is normal.     Breath sounds: Normal breath sounds. No wheezing or rales.  Neurological:     Mental Status: She is alert.    BP (!) 144/84    Pulse 85    Temp (!) 97.5 F (36.4 C) (Temporal)    Ht 5\' 6"  (1.676 m)    Wt 177 lb (80.3 kg)    SpO2 99%    BMI 28.57 kg/m  Wt Readings from Last 3 Encounters:  09/10/21 177 lb (80.3 kg)  08/15/21 174 lb (78.9 kg)  08/05/21 174 lb 9.6 oz (79.2 kg)     Health Maintenance Due  Topic Date Due   PAP SMEAR-Modifier  07/19/2021    There are no preventive care reminders to display for this patient.  Lab Results  Component Value Date   TSH 2.14 08/05/2021   Lab Results  Component Value Date   WBC 8.7 08/05/2021   HGB 14.2  08/05/2021   HCT 42.7 08/05/2021   MCV 89.6 08/05/2021   PLT 289.0 08/05/2021   Lab Results  Component Value Date   NA 138 08/05/2021   K 4.0 08/05/2021   CO2 27 08/05/2021   GLUCOSE 103 (H) 08/05/2021   BUN 17 08/05/2021   CREATININE 0.68 08/05/2021   BILITOT 0.6 08/05/2021   ALKPHOS 102 08/05/2021   AST 21 08/05/2021   ALT 21 08/05/2021   PROT 8.0 08/05/2021   ALBUMIN 4.9 08/05/2021   CALCIUM 9.5 08/05/2021   ANIONGAP 9 02/28/2019   GFR 91.83 08/05/2021   Lab Results  Component Value Date   CHOL 178 08/05/2021   Lab Results  Component Value Date   HDL 54.10 08/05/2021   Lab Results  Component Value Date   LDLCALC 88 08/05/2021   Lab Results  Component Value Date   TRIG 180.0 (H) 08/05/2021   Lab Results  Component Value Date   CHOLHDL 3 08/05/2021   Lab Results  Component Value Date   HGBA1C 6.0 08/05/2021      Assessment & Plan:   Problem List Items Addressed This Visit   None Visit Diagnoses     Cough, unspecified type    -  Primary   Wheezing         Patient has some persistent cough following recent influenza illness.  On exam she actually sounds improved with no audible wheezing at  this time but she states her wheezing comes and goes.  We agreed to Depo-Medrol 80 mg IM.  Be in touch if cough not resolving over the next few days.  If not go ahead with chest x-ray at that point.  Continue rescue inhaler as needed.  Meds ordered this encounter  Medications   predniSONE (DELTASONE) 20 MG tablet    Sig: Take two tablets by mouth once daily for 5 days    Dispense:  10 tablet    Refill:  0    Follow-up: No follow-ups on file.    Carolann Littler, MD

## 2021-09-14 DIAGNOSIS — N6092 Unspecified benign mammary dysplasia of left breast: Secondary | ICD-10-CM

## 2021-09-14 HISTORY — DX: Unspecified benign mammary dysplasia of left breast: N60.92

## 2021-09-25 ENCOUNTER — Other Ambulatory Visit: Payer: Self-pay | Admitting: General Surgery

## 2021-09-25 DIAGNOSIS — N6092 Unspecified benign mammary dysplasia of left breast: Secondary | ICD-10-CM

## 2021-09-26 ENCOUNTER — Other Ambulatory Visit: Payer: Self-pay | Admitting: General Surgery

## 2021-09-26 DIAGNOSIS — N6092 Unspecified benign mammary dysplasia of left breast: Secondary | ICD-10-CM

## 2021-10-02 ENCOUNTER — Other Ambulatory Visit: Payer: Self-pay | Admitting: General Surgery

## 2021-10-10 ENCOUNTER — Other Ambulatory Visit: Payer: Self-pay

## 2021-10-10 DIAGNOSIS — J301 Allergic rhinitis due to pollen: Secondary | ICD-10-CM | POA: Insufficient documentation

## 2021-10-10 DIAGNOSIS — J4540 Moderate persistent asthma, uncomplicated: Secondary | ICD-10-CM | POA: Insufficient documentation

## 2021-10-15 HISTORY — PX: BREAST LUMPECTOMY: SHX2

## 2021-10-23 ENCOUNTER — Ambulatory Visit: Payer: BC Managed Care – PPO | Admitting: Gastroenterology

## 2021-10-24 ENCOUNTER — Encounter (HOSPITAL_BASED_OUTPATIENT_CLINIC_OR_DEPARTMENT_OTHER): Payer: Self-pay | Admitting: General Surgery

## 2021-10-24 ENCOUNTER — Other Ambulatory Visit: Payer: Self-pay

## 2021-10-29 ENCOUNTER — Encounter (HOSPITAL_BASED_OUTPATIENT_CLINIC_OR_DEPARTMENT_OTHER)
Admission: RE | Admit: 2021-10-29 | Discharge: 2021-10-29 | Disposition: A | Discharge status: Home / Self Care | Payer: Medicare HMO | Source: Ambulatory Visit | Attending: General Surgery | Admitting: General Surgery

## 2021-10-29 DIAGNOSIS — Z01810 Encounter for preprocedural cardiovascular examination: Secondary | ICD-10-CM | POA: Diagnosis not present

## 2021-10-29 MED ORDER — ENSURE PRE-SURGERY PO LIQD: 296.0000 mL | Freq: Once | ORAL | Status: DC

## 2021-10-29 MED ORDER — CHLORHEXIDINE GLUCONATE CLOTH 2 % EX PADS: 6.0000 | MEDICATED_PAD | Freq: Once | CUTANEOUS | Status: DC

## 2021-10-29 NOTE — Progress Notes (Signed)
° ° ° ° °  Enhanced Recovery after Surgery for Orthopedics Enhanced Recovery after Surgery is a protocol used to improve the stress on your body and your recovery after surgery.  Patient Instructions  The night before surgery:  No food after midnight. ONLY clear liquids after midnight  The day of surgery (if you do NOT have diabetes):  Drink ONE (1) Pre-Surgery Clear Ensure as directed.   This drink was given to you during your hospital  pre-op appointment visit. The pre-op nurse will instruct you on the time to drink the  Pre-Surgery Ensure depending on your surgery time. Finish the drink at the designated time by the pre-op nurse.  Nothing else to drink after completing the  Pre-Surgery Clear Ensure.  The day of surgery (if you have diabetes): Drink ONE (1) Gatorade 2 (G2) as directed. This drink was given to you during your hospital  pre-op appointment visit.  The pre-op nurse will instruct you on the time to drink the   Gatorade 2 (G2) depending on your surgery time. Color of the Gatorade may vary. Red is not allowed. Nothing else to drink after completing the  Gatorade 2 (G2).         If you have questions, please contact your surgeons office.  Patient given soap and instructions, verbalized understanding.

## 2021-10-29 NOTE — Progress Notes (Signed)
EKG reviewed with Dr. Ambrose Pancoast, okay to proceed with surgery on 11/04/21 with Dr. Donne Hazel.

## 2021-11-03 ENCOUNTER — Ambulatory Visit
Admission: RE | Admit: 2021-11-03 | Discharge: 2021-11-03 | Disposition: A | Discharge status: Home / Self Care | Payer: Medicare HMO | Source: Ambulatory Visit | Attending: General Surgery | Admitting: General Surgery

## 2021-11-03 DIAGNOSIS — R928 Other abnormal and inconclusive findings on diagnostic imaging of breast: Secondary | ICD-10-CM | POA: Diagnosis not present

## 2021-11-03 DIAGNOSIS — N6092 Unspecified benign mammary dysplasia of left breast: Secondary | ICD-10-CM

## 2021-11-04 ENCOUNTER — Encounter (HOSPITAL_BASED_OUTPATIENT_CLINIC_OR_DEPARTMENT_OTHER)
Admission: RE | Disposition: A | Discharge status: Home / Self Care | Payer: Self-pay | Source: Ambulatory Visit | Attending: General Surgery

## 2021-11-04 ENCOUNTER — Ambulatory Visit (HOSPITAL_BASED_OUTPATIENT_CLINIC_OR_DEPARTMENT_OTHER): Payer: Medicare HMO | Admitting: Anesthesiology

## 2021-11-04 ENCOUNTER — Other Ambulatory Visit: Payer: Self-pay

## 2021-11-04 ENCOUNTER — Ambulatory Visit (HOSPITAL_BASED_OUTPATIENT_CLINIC_OR_DEPARTMENT_OTHER)
Admission: RE | Admit: 2021-11-04 | Discharge: 2021-11-04 | Disposition: A | Discharge status: Home / Self Care | Payer: Medicare HMO | Source: Ambulatory Visit | Attending: General Surgery | Admitting: General Surgery

## 2021-11-04 ENCOUNTER — Ambulatory Visit
Admission: RE | Admit: 2021-11-04 | Discharge: 2021-11-04 | Disposition: A | Discharge status: Home / Self Care | Payer: BC Managed Care – PPO | Source: Ambulatory Visit | Attending: General Surgery | Admitting: General Surgery

## 2021-11-04 ENCOUNTER — Encounter (HOSPITAL_BASED_OUTPATIENT_CLINIC_OR_DEPARTMENT_OTHER): Payer: Self-pay | Admitting: General Surgery

## 2021-11-04 ENCOUNTER — Other Ambulatory Visit: Payer: Self-pay | Admitting: General Surgery

## 2021-11-04 DIAGNOSIS — Z79899 Other long term (current) drug therapy: Secondary | ICD-10-CM | POA: Diagnosis not present

## 2021-11-04 DIAGNOSIS — R928 Other abnormal and inconclusive findings on diagnostic imaging of breast: Secondary | ICD-10-CM | POA: Diagnosis not present

## 2021-11-04 DIAGNOSIS — N6489 Other specified disorders of breast: Secondary | ICD-10-CM | POA: Diagnosis present

## 2021-11-04 DIAGNOSIS — I10 Essential (primary) hypertension: Secondary | ICD-10-CM | POA: Insufficient documentation

## 2021-11-04 DIAGNOSIS — C50912 Malignant neoplasm of unspecified site of left female breast: Secondary | ICD-10-CM | POA: Diagnosis not present

## 2021-11-04 DIAGNOSIS — C50412 Malignant neoplasm of upper-outer quadrant of left female breast: Secondary | ICD-10-CM | POA: Diagnosis not present

## 2021-11-04 DIAGNOSIS — N6012 Diffuse cystic mastopathy of left breast: Secondary | ICD-10-CM | POA: Diagnosis not present

## 2021-11-04 DIAGNOSIS — Z170 Estrogen receptor positive status [ER+]: Secondary | ICD-10-CM | POA: Insufficient documentation

## 2021-11-04 DIAGNOSIS — N6320 Unspecified lump in the left breast, unspecified quadrant: Secondary | ICD-10-CM

## 2021-11-04 DIAGNOSIS — N6092 Unspecified benign mammary dysplasia of left breast: Secondary | ICD-10-CM

## 2021-11-04 HISTORY — PX: RADIOACTIVE SEED GUIDED EXCISIONAL BREAST BIOPSY: SHX6490

## 2021-11-04 SURGERY — RADIOACTIVE SEED GUIDED BREAST BIOPSY
Anesthesia: General | Site: Breast | Laterality: Left

## 2021-11-04 MED ORDER — MIDAZOLAM HCL 2 MG/2ML IJ SOLN
INTRAMUSCULAR | Status: AC
Start: 2021-11-04 — End: ?
  Filled 2021-11-04: qty 2

## 2021-11-04 MED ORDER — MIDAZOLAM HCL 5 MG/5ML IJ SOLN
INTRAMUSCULAR | Status: DC | PRN
  Administered 2021-11-04: 2 mg via INTRAVENOUS

## 2021-11-04 MED ORDER — ONDANSETRON HCL 4 MG/2ML IJ SOLN
INTRAMUSCULAR | Status: DC | PRN
Start: 2021-11-04 — End: 2021-11-04
  Administered 2021-11-04: 4 mg via INTRAVENOUS

## 2021-11-04 MED ORDER — LIDOCAINE HCL (CARDIAC) PF 100 MG/5ML IV SOSY
PREFILLED_SYRINGE | INTRAVENOUS | Status: DC | PRN
Start: 2021-11-04 — End: 2021-11-04
  Administered 2021-11-04: 60 mg via INTRAVENOUS

## 2021-11-04 MED ORDER — MEPERIDINE HCL 25 MG/ML IJ SOLN: 6.2500 mg | INTRAMUSCULAR | Status: DC | PRN

## 2021-11-04 MED ORDER — ACETAMINOPHEN 500 MG PO TABS
ORAL_TABLET | ORAL | Status: AC
  Filled 2021-11-04: qty 1

## 2021-11-04 MED ORDER — ACETAMINOPHEN 500 MG PO TABS
ORAL_TABLET | ORAL | Status: AC
Start: 2021-11-04 — End: ?
  Filled 2021-11-04: qty 2

## 2021-11-04 MED ORDER — PHENYLEPHRINE HCL (PRESSORS) 10 MG/ML IV SOLN
INTRAVENOUS | Status: DC | PRN
  Administered 2021-11-04: 40 ug via INTRAVENOUS

## 2021-11-04 MED ORDER — CEFAZOLIN SODIUM-DEXTROSE 2-4 GM/100ML-% IV SOLN
2.0000 g | INTRAVENOUS | Status: AC
  Administered 2021-11-04: 2 g via INTRAVENOUS

## 2021-11-04 MED ORDER — CEFAZOLIN SODIUM-DEXTROSE 2-4 GM/100ML-% IV SOLN
INTRAVENOUS | Status: AC
  Filled 2021-11-04: qty 100

## 2021-11-04 MED ORDER — SCOPOLAMINE 1 MG/3DAYS TD PT72
1.0000 | MEDICATED_PATCH | Freq: Once | TRANSDERMAL | Status: DC
Start: 2021-11-04 — End: 2021-11-04
  Administered 2021-11-04: 1.5 mg via TRANSDERMAL

## 2021-11-04 MED ORDER — DEXAMETHASONE SODIUM PHOSPHATE 10 MG/ML IJ SOLN
INTRAMUSCULAR | Status: DC | PRN
Start: 2021-11-04 — End: 2021-11-04
  Administered 2021-11-04: 10 mg via INTRAVENOUS

## 2021-11-04 MED ORDER — PHENYLEPHRINE 40 MCG/ML (10ML) SYRINGE FOR IV PUSH (FOR BLOOD PRESSURE SUPPORT)
PREFILLED_SYRINGE | INTRAVENOUS | Status: AC
  Filled 2021-11-04: qty 10

## 2021-11-04 MED ORDER — FENTANYL CITRATE (PF) 100 MCG/2ML IJ SOLN
INTRAMUSCULAR | Status: AC
  Filled 2021-11-04: qty 2

## 2021-11-04 MED ORDER — BUPIVACAINE HCL (PF) 0.25 % IJ SOLN
INTRAMUSCULAR | Status: DC | PRN
  Administered 2021-11-04: 10 mL

## 2021-11-04 MED ORDER — LIDOCAINE 2% (20 MG/ML) 5 ML SYRINGE
INTRAMUSCULAR | Status: AC
  Filled 2021-11-04: qty 5

## 2021-11-04 MED ORDER — OXYCODONE HCL 5 MG PO TABS
5.0000 mg | ORAL_TABLET | Freq: Once | ORAL | Status: AC | PRN
  Administered 2021-11-04: 5 mg via ORAL

## 2021-11-04 MED ORDER — PROMETHAZINE HCL 25 MG/ML IJ SOLN: 6.2500 mg | INTRAMUSCULAR | Status: DC | PRN

## 2021-11-04 MED ORDER — ONDANSETRON HCL 4 MG/2ML IJ SOLN
INTRAMUSCULAR | Status: AC
  Filled 2021-11-04: qty 2

## 2021-11-04 MED ORDER — OXYCODONE HCL 5 MG PO TABS
ORAL_TABLET | ORAL | Status: AC
  Filled 2021-11-04: qty 1

## 2021-11-04 MED ORDER — DROPERIDOL 2.5 MG/ML IJ SOLN
INTRAMUSCULAR | Status: AC
  Filled 2021-11-04: qty 2

## 2021-11-04 MED ORDER — DEXAMETHASONE SODIUM PHOSPHATE 10 MG/ML IJ SOLN
INTRAMUSCULAR | Status: AC
Start: 2021-11-04 — End: ?
  Filled 2021-11-04: qty 1

## 2021-11-04 MED ORDER — SCOPOLAMINE 1 MG/3DAYS TD PT72
MEDICATED_PATCH | TRANSDERMAL | Status: AC
  Filled 2021-11-04: qty 1

## 2021-11-04 MED ORDER — FENTANYL CITRATE (PF) 100 MCG/2ML IJ SOLN
INTRAMUSCULAR | Status: DC | PRN
  Administered 2021-11-04 (×2): 25 ug via INTRAVENOUS

## 2021-11-04 MED ORDER — PROPOFOL 10 MG/ML IV BOLUS
INTRAVENOUS | Status: AC
  Filled 2021-11-04: qty 20

## 2021-11-04 MED ORDER — OXYCODONE HCL 5 MG/5ML PO SOLN: 5.0000 mg | Freq: Once | ORAL | Status: AC | PRN

## 2021-11-04 MED ORDER — PROPOFOL 10 MG/ML IV BOLUS
INTRAVENOUS | Status: DC | PRN
  Administered 2021-11-04: 200 mg via INTRAVENOUS

## 2021-11-04 MED ORDER — HYDROMORPHONE HCL 1 MG/ML IJ SOLN: 0.2500 mg | INTRAMUSCULAR | Status: DC | PRN

## 2021-11-04 MED ORDER — LACTATED RINGERS IV SOLN: INTRAVENOUS | Status: DC

## 2021-11-04 MED ORDER — ACETAMINOPHEN 500 MG PO TABS
1000.0000 mg | ORAL_TABLET | ORAL | Status: AC
  Administered 2021-11-04: 1000 mg via ORAL

## 2021-11-04 MED ORDER — DROPERIDOL 2.5 MG/ML IJ SOLN
INTRAMUSCULAR | Status: DC | PRN
  Administered 2021-11-04: .625 mg via INTRAVENOUS

## 2021-11-04 SURGICAL SUPPLY — 57 items
ADH SKN CLS APL DERMABOND .7 (GAUZE/BANDAGES/DRESSINGS) ×1
APL PRP STRL LF DISP 70% ISPRP (MISCELLANEOUS) ×1
APPLIER CLIP 9.375 MED OPEN (MISCELLANEOUS)
APR CLP MED 9.3 20 MLT OPN (MISCELLANEOUS)
BINDER BREAST LRG (GAUZE/BANDAGES/DRESSINGS) IMPLANT
BINDER BREAST MEDIUM (GAUZE/BANDAGES/DRESSINGS) IMPLANT
BINDER BREAST XLRG (GAUZE/BANDAGES/DRESSINGS) ×1 IMPLANT
BINDER BREAST XXLRG (GAUZE/BANDAGES/DRESSINGS) IMPLANT
BLADE SURG 15 STRL LF DISP TIS (BLADE) ×1 IMPLANT
BLADE SURG 15 STRL SS (BLADE) ×2
CANISTER SUC SOCK COL 7IN (MISCELLANEOUS) IMPLANT
CANISTER SUCT 1200ML W/VALVE (MISCELLANEOUS) IMPLANT
CHLORAPREP W/TINT 26 (MISCELLANEOUS) ×2 IMPLANT
CLIP APPLIE 9.375 MED OPEN (MISCELLANEOUS) IMPLANT
CLIP TI WIDE RED SMALL 6 (CLIP) IMPLANT
COVER BACK TABLE 60X90IN (DRAPES) ×2 IMPLANT
COVER MAYO STAND STRL (DRAPES) ×2 IMPLANT
COVER PROBE W GEL 5X96 (DRAPES) ×2 IMPLANT
DERMABOND ADVANCED (GAUZE/BANDAGES/DRESSINGS) ×1
DERMABOND ADVANCED .7 DNX12 (GAUZE/BANDAGES/DRESSINGS) ×1 IMPLANT
DRAPE LAPAROSCOPIC ABDOMINAL (DRAPES) ×2 IMPLANT
DRAPE UTILITY XL STRL (DRAPES) ×2 IMPLANT
DRSG TEGADERM 4X4.75 (GAUZE/BANDAGES/DRESSINGS) IMPLANT
ELECT COATED BLADE 2.86 ST (ELECTRODE) ×2 IMPLANT
ELECT REM PT RETURN 9FT ADLT (ELECTROSURGICAL) ×2
ELECTRODE REM PT RTRN 9FT ADLT (ELECTROSURGICAL) ×1 IMPLANT
GAUZE SPONGE 4X4 12PLY STRL LF (GAUZE/BANDAGES/DRESSINGS) IMPLANT
GLOVE SURG ENC MOIS LTX SZ7 (GLOVE) ×3 IMPLANT
GLOVE SURG POLYISO LF SZ6.5 (GLOVE) ×1 IMPLANT
GLOVE SURG UNDER POLY LF SZ7 (GLOVE) ×1 IMPLANT
GLOVE SURG UNDER POLY LF SZ7.5 (GLOVE) ×2 IMPLANT
GOWN STRL REUS W/ TWL LRG LVL3 (GOWN DISPOSABLE) ×2 IMPLANT
GOWN STRL REUS W/TWL LRG LVL3 (GOWN DISPOSABLE) ×4
HEMOSTAT ARISTA ABSORB 3G PWDR (HEMOSTASIS) IMPLANT
KIT MARKER MARGIN INK (KITS) ×2 IMPLANT
NDL HYPO 25X1 1.5 SAFETY (NEEDLE) ×1 IMPLANT
NEEDLE HYPO 25X1 1.5 SAFETY (NEEDLE) ×2 IMPLANT
NS IRRIG 1000ML POUR BTL (IV SOLUTION) IMPLANT
PACK BASIN DAY SURGERY FS (CUSTOM PROCEDURE TRAY) ×2 IMPLANT
PENCIL SMOKE EVACUATOR (MISCELLANEOUS) ×2 IMPLANT
RETRACTOR ONETRAX LX 90X20 (MISCELLANEOUS) IMPLANT
SLEEVE SCD COMPRESS KNEE MED (STOCKING) ×2 IMPLANT
SPIKE FLUID TRANSFER (MISCELLANEOUS) IMPLANT
SPONGE T-LAP 4X18 ~~LOC~~+RFID (SPONGE) ×2 IMPLANT
STRIP CLOSURE SKIN 1/2X4 (GAUZE/BANDAGES/DRESSINGS) ×2 IMPLANT
SUT MNCRL AB 4-0 PS2 18 (SUTURE) ×1 IMPLANT
SUT MON AB 5-0 PS2 18 (SUTURE) IMPLANT
SUT SILK 2 0 SH (SUTURE) IMPLANT
SUT VIC AB 2-0 SH 27 (SUTURE) ×2
SUT VIC AB 2-0 SH 27XBRD (SUTURE) ×1 IMPLANT
SUT VIC AB 3-0 SH 27 (SUTURE) ×2
SUT VIC AB 3-0 SH 27X BRD (SUTURE) ×1 IMPLANT
SYR CONTROL 10ML LL (SYRINGE) ×2 IMPLANT
TOWEL GREEN STERILE FF (TOWEL DISPOSABLE) ×2 IMPLANT
TRAY FAXITRON CT DISP (TRAY / TRAY PROCEDURE) ×2 IMPLANT
TUBE CONNECTING 20X1/4 (TUBING) IMPLANT
YANKAUER SUCT BULB TIP NO VENT (SUCTIONS) IMPLANT

## 2021-11-04 NOTE — Discharge Instructions (Addendum)
[facility] Phone Number [phone]  POST OP INSTRUCTIONS Take 400 mg of ibuprofen every 8 hours or 650 mg tylenol every 6 hours for next 72 hours then as needed. Use ice several times daily also.   IF YOU HAVE DISABILITY OR FAMILY LEAVE FORMS, YOU MUST BRING THEM TO THE OFFICE FOR PROCESSING.  DO NOT GIVE THEM TO YOUR DOCTOR.  A prescription for pain medication may be given to you upon discharge.  Take your pain medication as prescribed, if needed.  If narcotic pain medicine is not needed, then you may take acetaminophen (Tylenol), naprosyn (Alleve) or ibuprofen (Advil) as needed. Take your usually prescribed medications unless otherwise directed If you need a refill on your pain medication, please contact your pharmacy.  They will contact our office to request authorization.  Prescriptions will not be filled after 5pm or on week-ends. You should eat very light the first 24 hours after surgery, such as soup, crackers, pudding, etc.  Resume your normal diet the day after surgery. Most patients will experience some swelling and bruising in the breast.  Ice packs and a good support bra will help.  Wear the breast binder provided or a sports bra for 72 hours day and night.  After that wear a sports bra during the day until you return to the office. Swelling and bruising can take several days to resolve.  It is common to experience some constipation if taking pain medication after surgery.  Increasing fluid intake and taking a stool softener will usually help or prevent this problem from occurring.  A mild laxative (Milk of Magnesia or Miralax) should be taken according to package directions if there are no bowel movements after 48 hours. Unless discharge instructions indicate otherwise, you may remove your bandages 48 hours after surgery and you may shower at that time.  You may have steri-strips (small skin tapes) in place directly over the incision.  These strips should be left  on the skin for 7-10 days and will come off on their own.  If your surgeon used skin glue on the incision, you may shower in 24 hours.  The glue will flake off over the next 2-3 weeks.  Any sutures or staples will be removed at the office during your follow-up visit. ACTIVITIES:  You may resume regular daily activities (gradually increasing) beginning the next day.  Wearing a good support bra or sports bra minimizes pain and swelling.  You may have sexual intercourse when it is comfortable. You may drive when you no longer are taking prescription pain medication, you can comfortably wear a seatbelt, and you can safely maneuver your car and apply brakes. RETURN TO WORK:  ______________________________________________________________________________________ Dennis Bast should see your doctor in the office for a follow-up appointment approximately two weeks after your surgery.  Your doctors nurse will typically make your follow-up appointment when she calls you with your pathology report.  Expect your pathology report 3-4 business days after your surgery.  You may call to check if you do not hear from [place] after three days. OTHER INSTRUCTIONS: _______________________________________________________________________________________________ _____________________________________________________________________________________________________________________________________ _____________________________________________________________________________________________________________________________________ _____________________________________________________________________________________________________________________________________  WHEN TO CALL DR WAKEFIELD: Fever over 101.0 Nausea and/or vomiting. Extreme swelling or bruising. Continued bleeding from incision. Increased pain, redness, or drainage from the incision.  The clinic staff is available to answer your questions during regular business hours.  Please  dont hesitate to call and ask to speak to one of the nurses for clinical concerns.  If you have a medical emergency, go  to the nearest emergency room or call 911.  A surgeon from [facility] is always on call at the hospital.  For further questions, please visit [email or web address]   Post Anesthesia Home Care Instructions  Activity: Get plenty of rest for the remainder of the day. A responsible individual must stay with you for 24 hours following the procedure.  For the next 24 hours, DO NOT: -Drive a car -Paediatric nurse -Drink alcoholic beverages -Take any medication unless instructed by your physician -Make any legal decisions or sign important papers.  Meals: Start with liquid foods such as gelatin or soup. Progress to regular foods as tolerated. Avoid greasy, spicy, heavy foods. If nausea and/or vomiting occur, drink only clear liquids until the nausea and/or vomiting subsides. Call your physician if vomiting continues.  Special Instructions/Symptoms: Your throat may feel dry or sore from the anesthesia or the breathing tube placed in your throat during surgery. If this causes discomfort, gargle with warm salt water. The discomfort should disappear within 24 hours.  If you had a scopolamine patch placed behind your ear for the management of post- operative nausea and/or vomiting:  1. The medication in the patch is effective for 72 hours, after which it should be removed.  Wrap patch in a tissue and discard in the trash. Wash hands thoroughly with soap and water. 2. You may remove the patch earlier than 72 hours if you experience unpleasant side effects which may include dry mouth, dizziness or visual disturbances. 3. Avoid touching the patch. Wash your hands with soap and water after contact with the patch.

## 2021-11-04 NOTE — Interval H&P Note (Signed)
History and Physical Interval Note:  11/04/2021 9:23 AM  Laura [name]  has presented today for surgery, with the diagnosis of LEFT BREAST MASS.  The various methods of treatment have been discussed with the patient and family. After consideration of risks, benefits and other options for treatment, the patient has consented to  Procedure(s): RADIOACTIVE SEED GUIDED EXCISIONAL LEFT BREAST BIOPSY (Left) as a surgical intervention.  The patient's history has been reviewed, patient examined, no change in status, stable for surgery.  I have reviewed the patient's chart and labs.  Questions were answered to the patient's satisfaction.     Rolm Bookbinder

## 2021-11-04 NOTE — Transfer of Care (Signed)
Immediate Anesthesia Transfer of Care Note  Patient: Laura [name]  Procedure(s) Performed: RADIOACTIVE SEED GUIDED EXCISIONAL LEFT BREAST BIOPSY (Left: Breast)  Patient Location: PACU  Anesthesia Type:General  Level of Consciousness: drowsy  Airway & Oxygen Therapy: Patient Spontanous Breathing and Patient connected to face mask oxygen  Post-op Assessment: Report given to RN and Post -op Vital signs reviewed and stable  Post vital signs: Reviewed and stable  Last Vitals:  Vitals Value Taken Time  BP 156/87 11/04/21 1058  Temp    Pulse 81 11/04/21 1100  Resp 15 11/04/21 1100  SpO2 97 % 11/04/21 1100  Vitals shown include unvalidated device data.  Last Pain:  Vitals:   11/04/21 0838  TempSrc: Oral  PainSc: 0-No pain      Patients Stated Pain Goal: 4 (43/60/67 7034)  Complications: No notable events documented.

## 2021-11-04 NOTE — Anesthesia Preprocedure Evaluation (Signed)
Anesthesia Evaluation  Patient identified by MRN, date of birth, ID band Patient awake    Reviewed: Allergy & Precautions, NPO status , Patient's Chart, lab work & pertinent test results  History of Anesthesia Complications (+) PONV and history of anesthetic complications  Airway Mallampati: I  TM Distance: >3 FB Neck ROM: Full    Dental  (+) Dental Advisory Given   Pulmonary asthma ,  02/28/2019 novel coronavirus NEG   breath sounds clear to auscultation       Cardiovascular hypertension, Pt. on medications (-) angina Rhythm:Regular Rate:Normal  '11 ECHO: normal LVF, normal valves   Neuro/Psych Chronic back pain    GI/Hepatic Neg liver ROS, GERD  Controlled,  Endo/Other  negative endocrine ROS  Renal/GU negative Renal ROS     Musculoskeletal  (+) Arthritis , Osteoarthritis,    Abdominal   Peds  Hematology negative hematology ROS (+)   Anesthesia Other Findings   Reproductive/Obstetrics                             Anesthesia Physical  Anesthesia Plan  ASA: II  Anesthesia Plan: General   Post-op Pain Management: Minimal or no pain anticipated   Induction: Intravenous  PONV Risk Score and Plan: 4 or greater and Dexamethasone, Ondansetron, Midazolam, Droperidol and Treatment may vary due to age or medical condition  Airway Management Planned: LMA  Additional Equipment:   Intra-op Plan:   Post-operative Plan: Extubation in OR  Informed Consent: I have reviewed the patients History and Physical, chart, labs and discussed the procedure including the risks, benefits and alternatives for the proposed anesthesia with the patient or authorized representative who has indicated his/her understanding and acceptance.     Dental advisory given  Plan Discussed with: CRNA and Surgeon  Anesthesia Plan Comments:         Anesthesia Quick Evaluation

## 2021-11-04 NOTE — H&P (Signed)
°  66 y.o. female who is seen today as an office consultation at the request of Dr. Ronita Hipps for evaluation of left breast atypical lobular hyperplasia .  She has no family history of breast cancer. She had no mass or discharge. She does have a history of a left breast excisional biopsy that I did in 2015 for a lesion not amenable to stereotactic biopsy that ended up being a papilloma. She underwent a screening mammogram. She had B density breast. The right was negative. The left at a possible distortion. Diagnostic view shows a small irregular mass in the left breast. Biopsy was performed that shows atypical lobular hyperplasia. The she this is read as concordant with excision recommended in the radiology notes. She is otherwise fairly healthy and comes in today to discuss this.  Review of Systems: A complete review of systems was obtained from the patient. I have reviewed this information and discussed as appropriate with the patient. See HPI as well for other ROS.  Review of Systems  All other systems reviewed and are negative.  Medical History: Past Medical History:  Diagnosis Date   GERD (gastroesophageal reflux disease)   Hypertension   Past Surgical History:  Procedure Laterality Date   BREAST EXCISIONAL BIOPSY Left  papilloma   POSTERIOR FUSION LUMBAR SPINE   REPLACEMENT TOTAL KNEE Right    No Known Allergies  Current Outpatient Medications on File Prior to Visit  Medication Sig Dispense Refill   losartan (COZAAR) 25 MG tablet losartan 25 mg tablet TAKE 1 TABLET BY MOUTH EVERY DAY   aspirin 81 mg Cap 1 tablet   CALCIUM ORAL Take by mouth   vitamin E 400 UNIT capsule Take by mouth    Family History  Problem Relation Age of Onset   Skin cancer Mother   High blood pressure (Hypertension) Mother   Hyperlipidemia (Elevated cholesterol) Mother   Diabetes Mother    Social History   Tobacco Use  Smoking Status Never  Smokeless Tobacco Never    Social History    Socioeconomic History   Marital status: Married  Tobacco Use   Smoking status: Never   Smokeless tobacco: Never  Substance and Sexual Activity   Alcohol use: Never   Drug use: Never   Objective:   Body mass index is 27.88 kg/m.  Physical Exam Constitutional:  Appearance: Normal appearance.  Chest:  Breasts: Right: No inverted nipple, mass or nipple discharge.  Left: No inverted nipple, mass or nipple discharge.  Lymphadenopathy:  Upper Body:  Right upper body: No supraclavicular or axillary adenopathy.  Left upper body: No supraclavicular or axillary adenopathy.  Neurological:  Mental Status: She is alert.    Assessment and Plan:  Atypical lobular hyperplasia of left breast  Left breast radioactive seed guided excisional biopsy  We discussed atypical lobular hyperplasia. We discussed that excision of this is not mandatory. We discussed following this was a 5-month mammogram. She very much wants this small mass removed. I think it is not unreasonable to proceed with a radioactive seed guided excisional biopsy. We discussed risks and recovery. We also discussed the high risk nature of this lesion and once this is excised when she returns we will discuss different options moving forward.

## 2021-11-04 NOTE — Anesthesia Procedure Notes (Signed)
Procedure Name: LMA Insertion Date/Time: 11/04/2021 10:26 AM Performed by: Lavonia Dana, CRNA Pre-anesthesia Checklist: Patient identified, Emergency Drugs available, Suction available and Patient being monitored Patient Re-evaluated:Patient Re-evaluated prior to induction Oxygen Delivery Method: Circle system utilized Preoxygenation: Pre-oxygenation with 100% oxygen Induction Type: IV induction Ventilation: Mask ventilation without difficulty LMA: LMA inserted LMA Size: 4.0 Number of attempts: 1 Airway Equipment and Method: Bite block Placement Confirmation: positive ETCO2 Tube secured with: Tape Dental Injury: Teeth and Oropharynx as per pre-operative assessment

## 2021-11-04 NOTE — Anesthesia Postprocedure Evaluation (Signed)
Anesthesia Post Note  Patient: Laura [name]  Procedure(s) Performed: RADIOACTIVE SEED GUIDED EXCISIONAL LEFT BREAST BIOPSY (Left: Breast)     Patient location during evaluation: PACU Anesthesia Type: General Level of consciousness: awake and alert Pain management: pain level controlled Vital Signs Assessment: post-procedure vital signs reviewed and stable Respiratory status: spontaneous breathing, nonlabored ventilation and respiratory function stable Cardiovascular status: blood pressure returned to baseline and stable Postop Assessment: no apparent nausea or vomiting Anesthetic complications: no   No notable events documented.  Last Vitals:  Vitals:   11/04/21 1130 11/04/21 1207  BP: (!) 150/79 (!) 157/78  Pulse: 69 77  Resp: 15 18  Temp:  36.7 C  SpO2: 94% 95%    Last Pain:  Vitals:   11/04/21 1207  TempSrc:   PainSc: [name]

## 2021-11-04 NOTE — Op Note (Signed)
Preoperative diagnosis: Left breast seed guided excisional biopsy Postoperative diagnosis: Same as above Procedure:Left breast radioactive seed guided excisional biopsy Surgeon: Dr. Serita Grammes Anesthesia: General Specimens: Left breast tissue containing seed and clip marked with paint Complications: None Drains: None Estimated blood loss: Minimal Sponge no counts correct completion Decision recovery stable condition  Indications: 66 y.o. female with left breast mass.  She does have a history of a left breast excisional biopsy that I did in 2015 for a lesion not amenable to stereotactic biopsy that ended up being a papilloma. She underwent a screening mammogram. She has B density breast. The left at a possible distortion. Diagnostic view shows a small irregular mass in the left breast. Biopsy was performed that shows atypical lobular hyperplasia. The she this is read as concordant with excision recommended in the radiology notes. She elected to proceed with excision.   Procedure: After informed consent was obtained the patient first underwent seed placement.  I had these mammograms available for my review in the operating room. She was given antibiotics.  SCDs were placed.  She was placed under general anesthesia without complication.  She was prepped and draped in a standard sterile surgical fashion.  Surgical timeout was then performed.  The seed was in the very superior breast.  I infiltrated marcaine and made a curvilinear incision overlying the seed.    I then used the neoprobe to guide excision of the seed and the surrounding tissue.   I then took a mammogram of this.  A clip and a seed were present.  I then obtained hemostasis.  It was closed with 2-0 Vicryl, 3-0 Vicryl, and 4-0 Monocryl.  Glue and a Steri-Strips were applied.  She tolerated this well and was extubated transferred to recovery

## 2021-11-04 NOTE — Interval H&P Note (Signed)
History and Physical Interval Note:  11/04/2021 9:24 AM  Sande Brothers  has presented today for surgery, with the diagnosis of LEFT BREAST MASS.  The various methods of treatment have been discussed with the patient and family. After consideration of risks, benefits and other options for treatment, the patient has consented to  Procedure(s): RADIOACTIVE SEED GUIDED EXCISIONAL LEFT BREAST BIOPSY (Left) as a surgical intervention.  The patient's history has been reviewed, patient examined, no change in status, stable for surgery.  I have reviewed the patient's chart and labs.  Questions were answered to the patient's satisfaction.     Rolm Bookbinder

## 2021-11-05 ENCOUNTER — Encounter (HOSPITAL_BASED_OUTPATIENT_CLINIC_OR_DEPARTMENT_OTHER): Payer: Self-pay | Admitting: General Surgery

## 2021-11-10 NOTE — Progress Notes (Signed)
Upper outer quadrant

## 2021-11-13 LAB — SURGICAL PATHOLOGY

## 2021-11-17 DIAGNOSIS — H43813 Vitreous degeneration, bilateral: Secondary | ICD-10-CM | POA: Diagnosis not present

## 2021-11-17 DIAGNOSIS — H5213 Myopia, bilateral: Secondary | ICD-10-CM | POA: Diagnosis not present

## 2021-11-17 DIAGNOSIS — H2513 Age-related nuclear cataract, bilateral: Secondary | ICD-10-CM | POA: Diagnosis not present

## 2021-11-17 DIAGNOSIS — H40013 Open angle with borderline findings, low risk, bilateral: Secondary | ICD-10-CM | POA: Diagnosis not present

## 2021-11-21 ENCOUNTER — Telehealth: Payer: Self-pay | Admitting: Hematology and Oncology

## 2021-11-21 DIAGNOSIS — C50412 Malignant neoplasm of upper-outer quadrant of left female breast: Secondary | ICD-10-CM | POA: Insufficient documentation

## 2021-11-21 DIAGNOSIS — C50912 Malignant neoplasm of unspecified site of left female breast: Secondary | ICD-10-CM | POA: Insufficient documentation

## 2021-11-21 NOTE — Telephone Encounter (Signed)
Scheduled appt per 3/9 referral. Pt is aware of appt date and time. Pt is aware to arrive 15 mins prior to appt time and to bring and updated insurance card. Pt is aware of appt location.   ?

## 2021-11-21 NOTE — Progress Notes (Incomplete)
Location of Breast Cancer: left breast  Histology per Pathology Report: shows atypical lobular hyperplasia A. BREAST, LEFT W/SEED, LUMPECTOMY:  Invasive pleomorphic lobular carcinoma, grade 2 (3+3+1)  Tumor measures 5 mm in greatest dimension (pT1a)  Margins free (2 mm from posterior margin)  Benign breast with mildly proliferative fibrocystic changes  Changes consistent with prior biopsy   Receptor Status:  Estrogen Receptor: POSITIVE, 100% STRONG STAINING INTENSITY  Progesterone Receptor: POSITIVE, 95% STRONG STAINING INTENSITY  Proliferation Marker Ki-67: 2%  HER2 **NEGATIVE**  Did patient present with symptoms (if so, please note symptoms) or was this found on screening mammography?: She underwent a screening mammogram. She had B density breast. The right was negative. The left at a possible distortion.  Past/Anticipated interventions by surgeon, if any:  Procedure:Left breast radioactive seed guided excisional biopsy Surgeon: Dr. Serita Grammes  she likely will not need chemotherapy for this. We still need to await the HER2 but I expect this will be negative. Most likely antiestrogens will be the next step. When she sees medical and radiation oncology will come up with a final plan.  Past/Anticipated interventions by medical oncology, if any: appointment pending with medical oncology  Lymphedema issues, if any:  {:18581} {t:21944}   Pain issues, if any:  {:18581} {PAIN DESCRIPTION:[id number]}  SAFETY ISSUES: Prior radiation? {:18581} Pacemaker/ICD? {:18581} Possible current pregnancy?no, hysterectomy Is the patient on methotrexate? {:18581}  Current Complaints / other details:  ***    ***

## 2021-11-25 NOTE — Progress Notes (Signed)
?Radiation Oncology         [phone] ?________________________________ ? ?Initial Outpatient Consultation ? ?Name: Laura [name] MRN: [id number]  ?Date: 11/26/2021  DOB: October 03, 1955 ? ?[id number], Alinda Sierras, MD  Rolm Bookbinder, MD  ? ?REFERRING PHYSICIAN: Rolm Bookbinder, MD ? ?DIAGNOSIS: The encounter diagnosis was Lobular carcinoma of left breast ([place]). ? ?Stage IA (T1a, pNx) Left Breast, Invasive pleomorphic lobular carcinoma, ER+ / PR+ / Her2-, Grade 2 ? ?HISTORY OF PRESENT ILLNESS::Laura [name] is a 66 y.o. female who is accompanied by her husband. she is seen as a courtesy of Dr. Donne Hazel for an opinion concerning radiation therapy as part of management for her recently diagnosed left breast cancer.  ? ?The patient underwent a bilateral routine screening mammogram on 07/18/21 which showed a possible distortion in the left breast. Diagnostic unilateral left breast mammogram with tomosynthesis and CAD on 08/05/21 further revealed a small irregular mass in the superior central left breast, posterior depth.  ? ?Superior/posterior left breast biopsy on 08/20/21 showed atypical lobular hyperplasia.  ? ?The patient opted to proceed with left breast lumpectomy without nodal biopsies on 11/04/21 under the care of Dr. Donne Hazel. Pathology from the procedure revealed: grade 2 invasive pleomorphic lobular carcinoma measuring 5 mm in the greatest dimension. All margins negative. Prognostic indicators significant for: ER status 100% positive, PR status 95% positive, both with strong staining intensity; Proliferation marker Ki67 at 2%; Her2 status negative.  ? ?Per her most recent post-op follow up with Dr. Donne Hazel on 11/14/21, the patient was found to have a left breast hematoma, but was otherwise noted to be recovering well. Given her final pathology, Dr. Donne Hazel informed the patient that antiestrogens will likely be her next step following RT. She will return to Dr. Donne Hazel following evaluations by  medical and radiation oncology. Pending evaluations by radiation and medical oncology, Dr. Donne Hazel also discussed the possibility of SLN biopsies in the future. ? ?PREVIOUS RADIATION THERAPY: No ? ?PAST MEDICAL HISTORY:  ?Past Medical History:  ?Diagnosis Date  ? ABNORMAL ELECTROCARDIOGRAM 08/05/2010  ? 02-19-15 EKG shows NSR. Pt voices no issues with heart related problems.  ? ACNE, MILD 02/17/2008  ? not a problem so much 06-18-15  ? ALLERGIC RHINITIS 01/25/2008  ? Allergy   ? ASTHMA 01/25/2008  ? Asthma   ? Atypical lobular hyperplasia [facility]) of left breast 09/2021  ? 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 CONSTIPATION 02/21/2008  ? uses fiber to control  ? ESOPHAGEAL STRICTURE 02/17/2008  ? dilation done - no problems since.  ? GERD 01/25/2008  ? uses Prevacid as needed  ? HYPERGLYCEMIA   ? HYPERTENSION 08/05/2010  ? Impaired glucose tolerance 01/11/2011  ? METATARSALGIA 11/08/2007  ? work related- no recent issues"wearing steel toe shoes at the time"  ? PONV (postoperative nausea and vomiting)   ? Previous back surgery 10/26/17, 05/26/18  ? RECTAL BLEEDING 01/04/2008  ? 06-18-15 no problems now.  ? Tendonitis   ? right wrist-has soft splint in place.  ? Tibialis tendinitis 05/13/2010  ? Unspecified disorder of liver 01/19/2008  ? evaluated and thought to be related to pain med use at current time- no further issues noted.  ? VENTRICULAR HYPERTROPHY, LEFT 09/23/2010  ? Wears glasses   ? ? ?PAST SURGICAL HISTORY: ?Past Surgical History:  ?Procedure Laterality Date  ? ABDOMINAL HYSTERECTOMY  2002  ? TAH-LTSO-LOA  ? ARTHROSCOPIC REPAIR ACL Right 10/1998  ? right knee   ? BACK SURGERY  2019  ? BREAST  EXCISIONAL BIOPSY Left 2015  ? benign  ? BREAST SURGERY Left   ? lumpectomy-benign 2015  ? CARDIOVASCULAR STRESS TEST  2011  ? CARPAL TUNNEL RELEASE  2014  ? left  ? CHOLECYSTECTOMY  1985  ? open  ? COLONOSCOPY    ? COLONOSCOPY W/ POLYPECTOMY    ? DILATION AND CURETTAGE OF UTERUS    ? ELBOW ARTHROPLASTY  2014  ? left   ? ESOPHAGOGASTRODUODENOSCOPY  04/01/1998  ? KNEE ARTHROSCOPY Bilateral   ? meniscus tear-left.  ? NASAL SINUS SURGERY    ? s/p  ? ORIF ELBOW FRACTURE  12/23/2011  ? Procedure: OPEN REDUCTION INTERNAL FIXATION (ORIF) ELBOW/OLECRANON FRACTURE;  Surgeon: Linna Hoff, MD;  Location: [place];  Service: Orthopedics;  Laterality: Left;  LEFT ELBOW ORIF OF PROXIMAL OLECRANON AND RADIAL HEAD, POSSIBLE ARTHROPLASTY  ? OVARIAN CYST REMOVAL    ? RADIOACTIVE SEED GUIDED EXCISIONAL BREAST BIOPSY Left 11/04/2021  ? Procedure: RADIOACTIVE SEED GUIDED EXCISIONAL LEFT BREAST BIOPSY;  Surgeon: Rolm Bookbinder, MD;  Location: [place];  Service: General;  Laterality: Left;  ? TOTAL KNEE ARTHROPLASTY Right 06/24/2015  ? Procedure: RIGHT TOTAL KNEE ARTHROPLASTY;  Surgeon: Gaynelle Arabian, MD;  Location: [facility];  Service: Orthopedics;  Laterality: Right;  ? TRANSFORAMINAL LUMBAR INTERBODY FUSION (TLIF) WITH PEDICLE SCREW FIXATION 1 LEVEL Left 03/03/2019  ? Procedure: Left Lumbar four-five Redo Microdiscectomy with transforaminal lumbar interbody fusion;  Surgeon: Erline Levine, MD;  Location: [place];  Service: Neurosurgery;  Laterality: Left;  ? UPPER GASTROINTESTINAL ENDOSCOPY    ? US ECHOCARDIOGRAPHY  2011  ? ? ?FAMILY HISTORY:  ?Family History  ?Problem Relation Age of Onset  ? Diabetes Mother   ? Thyroid disease Mother   ?     hypothyroidism  ? Cancer Other   ?     Pancreatic Cancer-Aunt  ? Heart disease Father 67  ?     cad  ? Colon cancer Neg Hx   ? Colon polyps Neg Hx   ? Esophageal cancer Neg Hx   ? Stomach cancer Neg Hx   ? Rectal cancer Neg Hx   ? ? ?SOCIAL HISTORY:  ?Social History  ? ?Tobacco Use  ? Smoking status: Never  ? Smokeless tobacco: Never  ?Vaping Use  ? Vaping Use: Never used  ?Substance Use Topics  ? Alcohol use: No  ? Drug use: No  ? ? ?ALLERGIES:  ?Allergies  ?Allergen Reactions  ? Other Rash  ?  dermabond  ? ? ?MEDICATIONS:  ?Current Outpatient Medications  ?Medication Sig Dispense Refill  ?  albuterol (VENTOLIN HFA) 108 (90 Base) MCG/ACT inhaler Inhale 2 puffs into the lungs every 6 (six) hours as needed for wheezing or shortness of breath. 8 g 2  ? Ascorbic Acid (VITAMIN C) 100 MG CHEW     ? Aspirin (VAZALORE) 81 MG CAPS 1 tablet    ? calcium carbonate (OS-CAL - DOSED IN MG OF ELEMENTAL CALCIUM) 1250 (500 Ca) MG tablet Take 1 tablet by mouth.    ? Fiber CHEW Chew 2 tablets by mouth every morning.     ? fluticasone (FLONASE) 50 MCG/ACT nasal spray Place 1 spray into both nostrils daily as needed for allergies.     ? losartan (COZAAR) 25 MG tablet TAKE 1 TABLET BY MOUTH EVERY DAY 90 tablet 3  ? Multiple Vitamin (MULTIVITAMIN) tablet Take 1 tablet by mouth daily.    ? vitamin E 400 UNIT capsule Take 400 Units by mouth daily.    ?  valACYclovir (VALTREX) 1000 MG tablet Take two tablets by mouth at onset of cold sore and two tablets 12 hours later. (Patient not taking: Reported on 11/26/2021) 40 tablet 1  ? ?No current facility-administered medications for this encounter.  ? ? ?REVIEW OF SYSTEMS:  A 10+ POINT REVIEW OF SYSTEMS WAS OBTAINED including neurology, dermatology, psychiatry, cardiac, respiratory, lymph, extremities, GI, GU, musculoskeletal, constitutional, reproductive, HEENT.  Prior to biopsy the patient denied any pain within the breast area nipple discharge or bleeding.  She denies any problems with swelling in her left arm or hand. ?  ?PHYSICAL EXAM:  height is '5\' 7"'  (1.702 m) and weight is 177 lb 12.8 oz (80.6 kg). Her temperature is 98 ?F (36.7 ?C). Her blood pressure is 167/86 (abnormal) and her pulse is 73. Her respiration is 20 and oxygen saturation is 99%.   ?General: Alert and oriented, in no acute distress ?HEENT: Head is normocephalic. Extraocular movements are intact.  ?Neck: Neck is supple, no palpable cervical or supraclavicular lymphadenopathy. ?Heart: Regular in rate and rhythm with no murmurs, rubs, or gallops. ?Chest: Clear to auscultation bilaterally, with no rhonchi, wheezes,  or rales. ?Abdomen: Soft, nontender, nondistended, with no rigidity or guarding. ?Extremities: No cyanosis or edema. ?Lymphatics: see Neck Exam ?Skin: No concerning lesions. ?Musculoskeletal: symmetric s

## 2021-11-26 ENCOUNTER — Ambulatory Visit
Admission: RE | Admit: 2021-11-26 | Discharge: 2021-11-26 | Disposition: A | Discharge status: Home / Self Care | Payer: Medicare HMO | Source: Ambulatory Visit | Attending: Radiation Oncology | Admitting: Radiation Oncology

## 2021-11-26 ENCOUNTER — Encounter: Payer: Self-pay | Admitting: Radiation Oncology

## 2021-11-26 ENCOUNTER — Other Ambulatory Visit: Payer: Self-pay

## 2021-11-26 VITALS — BP 167/86 | HR 73 | Temp 98.0°F | Resp 20 | Ht 67.0 in | Wt 177.8 lb

## 2021-11-26 DIAGNOSIS — C50112 Malignant neoplasm of central portion of left female breast: Secondary | ICD-10-CM | POA: Diagnosis not present

## 2021-11-26 DIAGNOSIS — Z79899 Other long term (current) drug therapy: Secondary | ICD-10-CM | POA: Diagnosis not present

## 2021-11-26 DIAGNOSIS — I10 Essential (primary) hypertension: Secondary | ICD-10-CM | POA: Diagnosis not present

## 2021-11-26 DIAGNOSIS — C50912 Malignant neoplasm of unspecified site of left female breast: Secondary | ICD-10-CM | POA: Insufficient documentation

## 2021-11-26 DIAGNOSIS — Z170 Estrogen receptor positive status [ER+]: Secondary | ICD-10-CM | POA: Insufficient documentation

## 2021-11-26 DIAGNOSIS — K219 Gastro-esophageal reflux disease without esophagitis: Secondary | ICD-10-CM | POA: Diagnosis not present

## 2021-11-26 NOTE — Progress Notes (Signed)
See MD note for nursing evaluation. °

## 2021-11-27 ENCOUNTER — Encounter (HOSPITAL_COMMUNITY): Payer: Self-pay

## 2021-12-02 ENCOUNTER — Inpatient Hospital Stay: Payer: Medicare HMO | Attending: Hematology and Oncology | Admitting: Hematology and Oncology

## 2021-12-02 ENCOUNTER — Other Ambulatory Visit: Payer: Self-pay

## 2021-12-02 DIAGNOSIS — C50412 Malignant neoplasm of upper-outer quadrant of left female breast: Secondary | ICD-10-CM | POA: Insufficient documentation

## 2021-12-02 DIAGNOSIS — Z170 Estrogen receptor positive status [ER+]: Secondary | ICD-10-CM | POA: Insufficient documentation

## 2021-12-02 NOTE — Assessment & Plan Note (Signed)
11/04/2021: Screening mammogram detected possible distortion in the left breast initial biopsy was ALH, lumpectomy revealed grade 2 invasive lobular cancer with pleomorphic features measuring 5 mm size, margins negative, ER 100%, PR 95%, Ki-67 2%, HER2 negative (2+ by IHC but FISH negative) ? ?Pathology and radiology counseling:Discussed with the patient, the details of pathology including the type of breast cancer,the clinical staging, the significance of ER, PR and HER-2/neu receptors and the implications for treatment. After reviewing the pathology in detail, we proceeded to discuss the different treatment options between surgery, radiation, chemotherapy, antiestrogen therapies. ? ?Recommendations: ?1. Adjuvant radiation therapy followed by ?2. Adjuvant antiestrogen therapy ? ?Return to clinic after radiation is complete ?

## 2021-12-02 NOTE — Progress Notes (Signed)
[place] ?CONSULT NOTE ? ?Patient Care Team: ?Eulas Post, MD as PCP - General (Family Medicine) ?Barbaraann Rondo, MD (Obstetrics and Gynecology) ? ?CHIEF COMPLAINTS/PURPOSE OF CONSULTATION:  ?Newly diagnosed breast cancer BREAST, LEFT W/SEED, LUMPECTOMY ? ?HISTORY OF PRESENTING ILLNESS:  ?Laura [name] 66 y.o. female is here because of recent diagnosis of left breast w/seed, lumpectomy.Tumor measures 5 mm in greatest dimension  Invasive pleomorphic lobular carcinoma, grade 2 (3+3+1).  She is healing and recovering very well from recent surgery.  Denies any pain or discomfort. ?  ? ?I reviewed her records extensively and collaborated the history with the patient. ? ?SUMMARY OF ONCOLOGIC HISTORY: ?Oncology History  ?Malignant neoplasm of upper-outer quadrant of left breast in female, estrogen receptor positive ([place])  ?08/20/2021 Initial Diagnosis  ? Screening mammogram detected possible distortion in the left breast: Biopsy: Atypical lobular hyperplasia ?  ?11/04/2021 Surgery  ? Left lumpectomy: Grade 2 invasive lobular cancer pleomorphic features, 5 mm, margins negative, ER 100%, PR 95%, Ki-67 2%, HER2 2+ by IHC, FISH negative, ratio 1.31 ?  ? ? ? ?MEDICAL HISTORY:  ?Past Medical History:  ?Diagnosis Date  ? ABNORMAL ELECTROCARDIOGRAM 08/05/2010  ? 02-19-15 EKG shows NSR. Pt voices no issues with heart related problems.  ? ACNE, MILD 02/17/2008  ? not a problem so much 06-18-15  ? ALLERGIC RHINITIS 01/25/2008  ? Allergy   ? ASTHMA 01/25/2008  ? Asthma   ? Atypical lobular hyperplasia [facility]) of left breast 09/2021  ? 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 CONSTIPATION 02/21/2008  ? uses fiber to control  ? ESOPHAGEAL STRICTURE 02/17/2008  ? dilation done - no problems since.  ? GERD 01/25/2008  ? uses Prevacid as needed  ? HYPERGLYCEMIA   ? HYPERTENSION 08/05/2010  ? Impaired glucose tolerance 01/11/2011  ? METATARSALGIA 11/08/2007  ? work related- no recent issues"wearing steel toe shoes  at the time"  ? PONV (postoperative nausea and vomiting)   ? Previous back surgery 10/26/17, 05/26/18  ? RECTAL BLEEDING 01/04/2008  ? 06-18-15 no problems now.  ? Tendonitis   ? right wrist-has soft splint in place.  ? Tibialis tendinitis 05/13/2010  ? Unspecified disorder of liver 01/19/2008  ? evaluated and thought to be related to pain med use at current time- no further issues noted.  ? VENTRICULAR HYPERTROPHY, LEFT 09/23/2010  ? Wears glasses   ? ? ?SURGICAL HISTORY: ?Past Surgical History:  ?Procedure Laterality Date  ? ABDOMINAL HYSTERECTOMY  2002  ? TAH-LTSO-LOA  ? ARTHROSCOPIC REPAIR ACL Right 10/1998  ? right knee   ? BACK SURGERY  2019  ? BREAST EXCISIONAL BIOPSY Left 2015  ? benign  ? BREAST SURGERY Left   ? lumpectomy-benign 2015  ? CARDIOVASCULAR STRESS TEST  2011  ? CARPAL TUNNEL RELEASE  2014  ? left  ? CHOLECYSTECTOMY  1985  ? open  ? COLONOSCOPY    ? COLONOSCOPY W/ POLYPECTOMY    ? DILATION AND CURETTAGE OF UTERUS    ? ELBOW ARTHROPLASTY  2014  ? left  ? ESOPHAGOGASTRODUODENOSCOPY  04/01/1998  ? KNEE ARTHROSCOPY Bilateral   ? meniscus tear-left.  ? NASAL SINUS SURGERY    ? s/p  ? ORIF ELBOW FRACTURE  12/23/2011  ? Procedure: OPEN REDUCTION INTERNAL FIXATION (ORIF) ELBOW/OLECRANON FRACTURE;  Surgeon: Linna Hoff, MD;  Location: [place];  Service: Orthopedics;  Laterality: Left;  LEFT ELBOW ORIF OF PROXIMAL OLECRANON AND RADIAL HEAD, POSSIBLE ARTHROPLASTY  ? OVARIAN CYST REMOVAL    ?  RADIOACTIVE SEED GUIDED EXCISIONAL BREAST BIOPSY Left 11/04/2021  ? Procedure: RADIOACTIVE SEED GUIDED EXCISIONAL LEFT BREAST BIOPSY;  Surgeon: Rolm Bookbinder, MD;  Location: [place];  Service: General;  Laterality: Left;  ? TOTAL KNEE ARTHROPLASTY Right 06/24/2015  ? Procedure: RIGHT TOTAL KNEE ARTHROPLASTY;  Surgeon: Gaynelle Arabian, MD;  Location: [facility];  Service: Orthopedics;  Laterality: Right;  ? TRANSFORAMINAL LUMBAR INTERBODY FUSION (TLIF) WITH PEDICLE SCREW FIXATION 1 LEVEL Left 03/03/2019  ?  Procedure: Left Lumbar four-five Redo Microdiscectomy with transforaminal lumbar interbody fusion;  Surgeon: Erline Levine, MD;  Location: [place];  Service: Neurosurgery;  Laterality: Left;  ? UPPER GASTROINTESTINAL ENDOSCOPY    ? US ECHOCARDIOGRAPHY  2011  ? ? ?SOCIAL HISTORY: ?Social History  ? ?Socioeconomic History  ? Marital status: Married  ?  Spouse name: Not on file  ? Number of children: Not on file  ? Years of education: Not on file  ? Highest education level: Not on file  ?Occupational History  ? Not on file  ?Tobacco Use  ? Smoking status: Never  ? Smokeless tobacco: Never  ?Vaping Use  ? Vaping Use: Never used  ?Substance and Sexual Activity  ? Alcohol use: No  ? Drug use: No  ? Sexual activity: Yes  ?  Birth control/protection: Post-menopausal  ?Other Topics Concern  ? Not on file  ?Social History Narrative  ? Pt does not get regular exercise.  ? Daily Caffeine Use (12 oz Coke Zero per day)  ? ?Social Determinants of Health  ? ?Financial Resource Strain: Not on file  ?Food Insecurity: Not on file  ?Transportation Needs: Not on file  ?Physical Activity: Not on file  ?Stress: Not on file  ?Social Connections: Not on file  ?Intimate Partner Violence: Not on file  ? ? ?FAMILY HISTORY: ?Family History  ?Problem Relation Age of Onset  ? Diabetes Mother   ? Thyroid disease Mother   ?     hypothyroidism  ? Cancer Other   ?     Pancreatic Cancer-Aunt  ? Heart disease Father 25  ?     cad  ? Colon cancer Neg Hx   ? Colon polyps Neg Hx   ? Esophageal cancer Neg Hx   ? Stomach cancer Neg Hx   ? Rectal cancer Neg Hx   ? ? ?ALLERGIES:  is allergic to other. ? ?MEDICATIONS:  ?Current Outpatient Medications  ?Medication Sig Dispense Refill  ? albuterol (VENTOLIN HFA) 108 (90 Base) MCG/ACT inhaler Inhale 2 puffs into the lungs every 6 (six) hours as needed for wheezing or shortness of breath. 8 g 2  ? Ascorbic Acid (VITAMIN C) 100 MG CHEW     ? Aspirin (VAZALORE) 81 MG CAPS 1 tablet    ? calcium carbonate (OS-CAL -  DOSED IN MG OF ELEMENTAL CALCIUM) 1250 (500 Ca) MG tablet Take 1 tablet by mouth.    ? Fiber CHEW Chew 2 tablets by mouth every morning.     ? fluticasone (FLONASE) 50 MCG/ACT nasal spray Place 1 spray into both nostrils daily as needed for allergies.     ? losartan (COZAAR) 25 MG tablet TAKE 1 TABLET BY MOUTH EVERY DAY 90 tablet 3  ? Multiple Vitamin (MULTIVITAMIN) tablet Take 1 tablet by mouth daily.    ? valACYclovir (VALTREX) 1000 MG tablet Take two tablets by mouth at onset of cold sore and two tablets 12 hours later. (Patient not taking: Reported on 11/26/2021) 40 tablet 1  ? vitamin  E 400 UNIT capsule Take 400 Units by mouth daily.    ? ?No current facility-administered medications for this visit.  ? ? ?REVIEW OF SYSTEMS:   ?Constitutional: Denies fevers, chills or abnormal night sweats ?  ?All other systems were reviewed with the patient and are negative. ? ?PHYSICAL EXAMINATION: ?ECOG PERFORMANCE STATUS: 1 - Symptomatic but completely ambulatory ? ?Vitals:  ? 12/02/21 1302  ?BP: (!) 173/90  ?Pulse: 68  ?Resp: 18  ?Temp: 97.8 ?F (36.6 ?C)  ?SpO2: 96%  ? ?Filed Weights  ? 12/02/21 1302  ?Weight: 177 lb 1.6 oz (80.3 kg)  ? ?  ? ?LABORATORY DATA:  ?I have reviewed the data as listed ?Lab Results  ?Component Value Date  ? WBC 8.7 08/05/2021  ? HGB 14.2 08/05/2021  ? HCT 42.7 08/05/2021  ? MCV 89.6 08/05/2021  ? PLT 289.0 08/05/2021  ? ?Lab Results  ?Component Value Date  ? NA 138 08/05/2021  ? K 4.0 08/05/2021  ? CL 103 08/05/2021  ? CO2 27 08/05/2021  ? ? ?RADIOGRAPHIC STUDIES: ?I have personally reviewed the radiological reports and agreed with the findings in the report. ? ?ASSESSMENT AND PLAN:  ?Malignant neoplasm of upper-outer quadrant of left breast in female, estrogen receptor positive ([place]) ?11/04/2021: Screening mammogram detected possible distortion in the left breast initial biopsy was ALH, lumpectomy revealed grade 2 invasive lobular cancer with pleomorphic features measuring 5 mm size, margins  negative, ER 100%, PR 95%, Ki-67 2%, HER2 negative (2+ by IHC but FISH negative) ? ?Pathology and radiology counseling:Discussed with the patient, the details of pathology including the type of breast cancer,the

## 2021-12-03 ENCOUNTER — Encounter: Payer: Self-pay | Admitting: *Deleted

## 2021-12-08 ENCOUNTER — Ambulatory Visit (INDEPENDENT_AMBULATORY_CARE_PROVIDER_SITE_OTHER): Payer: Medicare HMO | Admitting: Family Medicine

## 2021-12-08 ENCOUNTER — Encounter: Payer: Self-pay | Admitting: Family Medicine

## 2021-12-08 VITALS — BP 160/80 | HR 93 | Temp 98.0°F | Ht 67.0 in | Wt 175.1 lb

## 2021-12-08 DIAGNOSIS — I517 Cardiomegaly: Secondary | ICD-10-CM

## 2021-12-08 MED ORDER — LOSARTAN POTASSIUM 100 MG PO TABS: 100.0000 mg | ORAL_TABLET | Freq: Every day | ORAL | 3 refills | Status: DC

## 2021-12-08 NOTE — Patient Instructions (Signed)
Consider trial of over the counter Melatonin 5 mg at night for insomnia.    May go up to 10 mg  ? ?Let me know if that does not work.   ?

## 2021-12-08 NOTE — Progress Notes (Signed)
? ?Established Patient Office Visit ? ?Subjective:  ?Patient ID: Laura [name], female    DOB: 10-Nov-1955  Age: 66 y.o. MRN: [id number] ? ?CC:  ?Chief Complaint  ?Patient presents with  ? Hypertension  ? ? ?HPI ?Sande Brothers presents for recent elevated blood pressures.  Recent diagnosis of left breast cancer.  She had lumpectomy with seed implantation.  Plan for treatment is adjuvant radiation therapy followed by adjuvant antiestrogen therapy. ? ?Her concern today though is elevated blood pressure.  Multiple elevated readings including several home readings around 170/90.  Recent reading at oncologist 173/90.  Currently on losartan 25 mg daily.  She doubled this up this morning to 50 mg.  No alcohol use.  No dietary changes.  She thinks stress may be elevating.  Non-smoker.  No headaches.  No chest pains.  No peripheral edema. ? ?Past Medical History:  ?Diagnosis Date  ? ABNORMAL ELECTROCARDIOGRAM 08/05/2010  ? 02-19-15 EKG shows NSR. Pt voices no issues with heart related problems.  ? ACNE, MILD 02/17/2008  ? not a problem so much 06-18-15  ? ALLERGIC RHINITIS 01/25/2008  ? Allergy   ? ASTHMA 01/25/2008  ? Asthma   ? 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 CONSTIPATION 02/21/2008  ? uses fiber to control  ? ESOPHAGEAL STRICTURE 02/17/2008  ? dilation done - no problems since.  ? GERD 01/25/2008  ? uses Prevacid as needed  ? HYPERGLYCEMIA   ? HYPERTENSION 08/05/2010  ? Impaired glucose tolerance 01/11/2011  ? METATARSALGIA 11/08/2007  ? work related- no recent issues"wearing steel toe shoes at the time"  ? PONV (postoperative nausea and vomiting)   ? Previous back surgery 10/26/17, 05/26/18  ? RECTAL BLEEDING 01/04/2008  ? 06-18-15 no problems now.  ? Tendonitis   ? right wrist-has soft splint in place.  ? Tibialis tendinitis 05/13/2010  ? Unspecified disorder of liver 01/19/2008  ? evaluated and thought to be related to pain med use at current  time- no further issues noted.  ? VENTRICULAR HYPERTROPHY, LEFT 09/23/2010  ? Wears glasses   ? ? ?Past Surgical History:  ?Procedure Laterality Date  ? ABDOMINAL HYSTERECTOMY  2002  ? TAH-LTSO-LOA  ? ARTHROSCOPIC REPAIR ACL Right 10/1998  ? right knee   ? BACK SURGERY  2019  ? BREAST EXCISIONAL BIOPSY Left 2015  ? benign  ? BREAST SURGERY Left   ? lumpectomy-benign 2015  ? CARDIOVASCULAR STRESS TEST  2011  ? CARPAL TUNNEL RELEASE  2014  ? left  ? CHOLECYSTECTOMY  1985  ? open  ? COLONOSCOPY    ? COLONOSCOPY W/ POLYPECTOMY    ? DILATION AND CURETTAGE OF UTERUS    ? ELBOW ARTHROPLASTY  2014  ? left  ? ESOPHAGOGASTRODUODENOSCOPY  04/01/1998  ? KNEE ARTHROSCOPY Bilateral   ? meniscus tear-left.  ? NASAL SINUS SURGERY    ? s/p  ? ORIF ELBOW FRACTURE  12/23/2011  ? Procedure: OPEN REDUCTION INTERNAL FIXATION (ORIF) ELBOW/OLECRANON FRACTURE;  Surgeon: Linna Hoff, MD;  Location: [place];  Service: Orthopedics;  Laterality: Left;  LEFT ELBOW ORIF OF PROXIMAL OLECRANON AND RADIAL HEAD, POSSIBLE ARTHROPLASTY  ? OVARIAN CYST REMOVAL    ? RADIOACTIVE SEED GUIDED EXCISIONAL BREAST BIOPSY Left 11/04/2021  ? Procedure: RADIOACTIVE SEED GUIDED EXCISIONAL LEFT BREAST BIOPSY;  Surgeon: Rolm Bookbinder, MD;  Location: [place];  Service: General;  Laterality: Left;  ? TOTAL KNEE ARTHROPLASTY Right 06/24/2015  ?  Procedure: RIGHT TOTAL KNEE ARTHROPLASTY;  Surgeon: Gaynelle Arabian, MD;  [facility];  Service: Orthopedics;  Laterality: Right;  ? TRANSFORAMINAL LUMBAR INTERBODY FUSION (TLIF) WITH PEDICLE SCREW FIXATION 1 LEVEL Left 03/03/2019  ? Procedure: Left Lumbar four-five Redo Microdiscectomy with transforaminal lumbar interbody fusion;  Surgeon: Erline Levine, MD;  Location: [place];  Service: Neurosurgery;  Laterality: Left;  ? UPPER GASTROINTESTINAL ENDOSCOPY    ? US ECHOCARDIOGRAPHY  2011  ? ? ?Family History  ?Problem Relation Age of Onset  ? Diabetes Mother   ? Thyroid disease Mother   ?     hypothyroidism  ?  Cancer Other   ?     Pancreatic Cancer-Aunt  ? Heart disease Father 45  ?     cad  ? Colon cancer Neg Hx   ? Colon polyps Neg Hx   ? Esophageal cancer Neg Hx   ? Stomach cancer Neg Hx   ? Rectal cancer Neg Hx   ? ? ?Social History  ? ?Socioeconomic History  ? Marital status: Married  ?  Spouse name: Not on file  ? Number of children: Not on file  ? Years of education: Not on file  ? Highest education level: Not on file  ?Occupational History  ? Not on file  ?Tobacco Use  ? Smoking status: Never  ? Smokeless tobacco: Never  ?Vaping Use  ? Vaping Use: Never used  ?Substance and Sexual Activity  ? Alcohol use: No  ? Drug use: No  ? Sexual activity: Yes  ?  Birth control/protection: Post-menopausal  ?Other Topics Concern  ? Not on file  ?Social History Narrative  ? Pt does not get regular exercise.  ? Daily Caffeine Use (12 oz Coke Zero per day)  ? ?Social Determinants of Health  ? ?Financial Resource Strain: Not on file  ?Food Insecurity: Not on file  ?Transportation Needs: Not on file  ?Physical Activity: Not on file  ?Stress: Not on file  ?Social Connections: Not on file  ?Intimate Partner Violence: Not on file  ? ? ?Outpatient Medications Prior to Visit  ?Medication Sig Dispense Refill  ? albuterol (VENTOLIN HFA) 108 (90 Base) MCG/ACT inhaler Inhale 2 puffs into the lungs every 6 (six) hours as needed for wheezing or shortness of breath. 8 g 2  ? Ascorbic Acid (VITAMIN C) 100 MG CHEW     ? Aspirin (VAZALORE) 81 MG CAPS 1 tablet    ? calcium carbonate (OS-CAL - DOSED IN MG OF ELEMENTAL CALCIUM) 1250 (500 Ca) MG tablet Take 1 tablet by mouth.    ? Fiber CHEW Chew 2 tablets by mouth every morning.     ? fluticasone (FLONASE) 50 MCG/ACT nasal spray Place 1 spray into both nostrils daily as needed for allergies.     ? Multiple Vitamin (MULTIVITAMIN) tablet Take 1 tablet by mouth daily.    ? valACYclovir (VALTREX) 1000 MG tablet Take two tablets by mouth at onset of cold sore and two tablets 12 hours later. 40 tablet 1  ?  vitamin E 400 UNIT capsule Take 400 Units by mouth daily.    ? losartan (COZAAR) 25 MG tablet TAKE 1 TABLET BY MOUTH EVERY DAY 90 tablet 3  ? ?No facility-administered medications prior to visit.  ? ? ?Allergies  ?Allergen Reactions  ? Other Rash  ?  dermabond  ? ? ?ROS ?Review of Systems  ?Constitutional:  Negative for fatigue.  ?Eyes:  Negative for visual disturbance.  ?Respiratory:  Negative for cough,  chest tightness, shortness of breath and wheezing.   ?Cardiovascular:  Negative for chest pain, palpitations and leg swelling.  ?Neurological:  Negative for dizziness, seizures, syncope, weakness, light-headedness and headaches.  ? ?  ?Objective:  ?  ?Physical Exam ?Constitutional:   ?   Appearance: She is well-developed.  ?Eyes:  ?   Pupils: Pupils are equal, round, and reactive to light.  ?Neck:  ?   Thyroid: No thyromegaly.  ?   Vascular: No JVD.  ?Cardiovascular:  ?   Rate and Rhythm: Normal rate and regular rhythm.  ?   Heart sounds:  ?  No gallop.  ?Pulmonary:  ?   Effort: Pulmonary effort is normal. No respiratory distress.  ?   Breath sounds: Normal breath sounds. No wheezing or rales.  ?Musculoskeletal:  ?   Cervical back: Neck supple.  ?   Right lower leg: No edema.  ?   Left lower leg: No edema.  ?Neurological:  ?   General: No focal deficit present.  ?   Mental Status: She is alert.  ? ? ?BP (!) 160/80 (BP Location: Left Arm, Patient Position: Sitting, Cuff Size: Normal)   Pulse 93   Temp 98 ?F (36.7 ?C) (Oral)   Ht '5\' 7"'$  (1.702 m)   Wt 175 lb 1.6 oz (79.4 kg)   SpO2 99%   BMI 27.42 kg/m?  ?Wt Readings from Last 3 Encounters:  ?12/08/21 175 lb 1.6 oz (79.4 kg)  ?12/02/21 177 lb 1.6 oz (80.3 kg)  ?11/26/21 177 lb 12.8 oz (80.6 kg)  ? ? ? ?Health Maintenance Due  ?Topic Date Due  ? Zoster Vaccines- Shingrix (1 of 2) Never done  ? COVID-19 Vaccine (5 - Booster) 11/14/2020  ? PAP SMEAR-Modifier  07/19/2021  ? Pneumonia Vaccine 42+ Years old (2 - PCV) 08/15/2021  ? ? ?There are no preventive care  reminders to display for this patient. ? ?Lab Results  ?Component Value Date  ? TSH 2.14 08/05/2021  ? ?Lab Results  ?Component Value Date  ? WBC 8.7 08/05/2021  ? HGB 14.2 08/05/2021  ? HCT 42.7 08/05/2021  ? M

## 2021-12-09 ENCOUNTER — Other Ambulatory Visit: Payer: Self-pay

## 2021-12-09 ENCOUNTER — Ambulatory Visit
Admission: RE | Admit: 2021-12-09 | Discharge: 2021-12-09 | Disposition: A | Discharge status: Home / Self Care | Payer: Medicare HMO | Source: Ambulatory Visit | Attending: Radiation Oncology | Admitting: Radiation Oncology

## 2021-12-09 DIAGNOSIS — C50412 Malignant neoplasm of upper-outer quadrant of left female breast: Secondary | ICD-10-CM | POA: Insufficient documentation

## 2021-12-09 DIAGNOSIS — Z510 Encounter for antineoplastic radiation therapy: Secondary | ICD-10-CM | POA: Insufficient documentation

## 2021-12-09 DIAGNOSIS — Z170 Estrogen receptor positive status [ER+]: Secondary | ICD-10-CM | POA: Insufficient documentation

## 2021-12-09 DIAGNOSIS — C50112 Malignant neoplasm of central portion of left female breast: Secondary | ICD-10-CM | POA: Diagnosis not present

## 2021-12-15 DIAGNOSIS — C50912 Malignant neoplasm of unspecified site of left female breast: Secondary | ICD-10-CM | POA: Diagnosis not present

## 2021-12-15 DIAGNOSIS — C50412 Malignant neoplasm of upper-outer quadrant of left female breast: Secondary | ICD-10-CM | POA: Insufficient documentation

## 2021-12-15 DIAGNOSIS — Z510 Encounter for antineoplastic radiation therapy: Secondary | ICD-10-CM | POA: Insufficient documentation

## 2021-12-15 DIAGNOSIS — C50112 Malignant neoplasm of central portion of left female breast: Secondary | ICD-10-CM | POA: Diagnosis not present

## 2021-12-15 DIAGNOSIS — Z170 Estrogen receptor positive status [ER+]: Secondary | ICD-10-CM | POA: Diagnosis not present

## 2021-12-16 ENCOUNTER — Encounter: Payer: Self-pay | Admitting: *Deleted

## 2021-12-16 ENCOUNTER — Ambulatory Visit
Admission: RE | Admit: 2021-12-16 | Discharge: 2021-12-16 | Disposition: A | Discharge status: Home / Self Care | Payer: Medicare HMO | Source: Ambulatory Visit | Attending: Radiation Oncology | Admitting: Radiation Oncology

## 2021-12-16 ENCOUNTER — Other Ambulatory Visit: Payer: Self-pay

## 2021-12-16 DIAGNOSIS — C50412 Malignant neoplasm of upper-outer quadrant of left female breast: Secondary | ICD-10-CM | POA: Diagnosis not present

## 2021-12-16 DIAGNOSIS — C50112 Malignant neoplasm of central portion of left female breast: Secondary | ICD-10-CM | POA: Diagnosis not present

## 2021-12-16 DIAGNOSIS — Z510 Encounter for antineoplastic radiation therapy: Secondary | ICD-10-CM | POA: Diagnosis not present

## 2021-12-16 DIAGNOSIS — Z170 Estrogen receptor positive status [ER+]: Secondary | ICD-10-CM | POA: Diagnosis not present

## 2021-12-16 MED ORDER — RADIAPLEXRX EX GEL: Freq: Once | CUTANEOUS | Status: AC

## 2021-12-16 NOTE — Progress Notes (Signed)
Pt here for patient teaching.    Pt given Radiation and You booklet, skin care instructions, and Radiaplex gel.    Reviewed areas of pertinence such as fatigue, hair loss in treatment field, skin changes, breast tenderness, and breast swelling .   Pt able to give teach back of to pat skin and use unscented/gentle soap,apply Radiaplex bid, avoid applying anything to skin within 4 hours of treatment, avoid wearing an under wire bra, and to use an electric razor if they must shave.   Pt verbalizes understanding of information given and will contact nursing with any questions or concerns.           

## 2021-12-17 ENCOUNTER — Telehealth: Payer: Self-pay | Admitting: Hematology and Oncology

## 2021-12-17 ENCOUNTER — Ambulatory Visit
Admission: RE | Admit: 2021-12-17 | Discharge: 2021-12-17 | Disposition: A | Discharge status: Home / Self Care | Payer: Medicare HMO | Source: Ambulatory Visit | Attending: Radiation Oncology | Admitting: Radiation Oncology

## 2021-12-17 DIAGNOSIS — C50112 Malignant neoplasm of central portion of left female breast: Secondary | ICD-10-CM | POA: Diagnosis not present

## 2021-12-17 DIAGNOSIS — Z170 Estrogen receptor positive status [ER+]: Secondary | ICD-10-CM | POA: Diagnosis not present

## 2021-12-17 DIAGNOSIS — Z510 Encounter for antineoplastic radiation therapy: Secondary | ICD-10-CM | POA: Diagnosis not present

## 2021-12-17 DIAGNOSIS — C50412 Malignant neoplasm of upper-outer quadrant of left female breast: Secondary | ICD-10-CM | POA: Diagnosis not present

## 2021-12-17 NOTE — Telephone Encounter (Signed)
.  Called patient to schedule appointment per 4/5 inbasket, patient is aware of date and time.   ?

## 2021-12-18 ENCOUNTER — Other Ambulatory Visit: Payer: Self-pay

## 2021-12-18 ENCOUNTER — Ambulatory Visit
Admission: RE | Admit: 2021-12-18 | Discharge: 2021-12-18 | Disposition: A | Discharge status: Home / Self Care | Payer: Medicare HMO | Source: Ambulatory Visit | Attending: Radiation Oncology | Admitting: Radiation Oncology

## 2021-12-18 DIAGNOSIS — C50412 Malignant neoplasm of upper-outer quadrant of left female breast: Secondary | ICD-10-CM | POA: Diagnosis not present

## 2021-12-18 DIAGNOSIS — Z170 Estrogen receptor positive status [ER+]: Secondary | ICD-10-CM | POA: Diagnosis not present

## 2021-12-18 DIAGNOSIS — Z510 Encounter for antineoplastic radiation therapy: Secondary | ICD-10-CM | POA: Diagnosis not present

## 2021-12-18 DIAGNOSIS — C50112 Malignant neoplasm of central portion of left female breast: Secondary | ICD-10-CM | POA: Diagnosis not present

## 2021-12-19 ENCOUNTER — Ambulatory Visit
Admission: RE | Admit: 2021-12-19 | Discharge: 2021-12-19 | Disposition: A | Discharge status: Home / Self Care | Payer: Medicare HMO | Source: Ambulatory Visit | Attending: Radiation Oncology | Admitting: Radiation Oncology

## 2021-12-19 DIAGNOSIS — Z510 Encounter for antineoplastic radiation therapy: Secondary | ICD-10-CM | POA: Diagnosis not present

## 2021-12-19 DIAGNOSIS — C50112 Malignant neoplasm of central portion of left female breast: Secondary | ICD-10-CM | POA: Diagnosis not present

## 2021-12-19 DIAGNOSIS — C50412 Malignant neoplasm of upper-outer quadrant of left female breast: Secondary | ICD-10-CM | POA: Diagnosis not present

## 2021-12-19 DIAGNOSIS — Z170 Estrogen receptor positive status [ER+]: Secondary | ICD-10-CM | POA: Diagnosis not present

## 2021-12-22 ENCOUNTER — Other Ambulatory Visit: Payer: Self-pay

## 2021-12-22 ENCOUNTER — Ambulatory Visit
Admission: RE | Admit: 2021-12-22 | Discharge: 2021-12-22 | Disposition: A | Discharge status: Home / Self Care | Payer: Medicare HMO | Source: Ambulatory Visit | Attending: Radiation Oncology | Admitting: Radiation Oncology

## 2021-12-22 DIAGNOSIS — Z170 Estrogen receptor positive status [ER+]: Secondary | ICD-10-CM | POA: Diagnosis not present

## 2021-12-22 DIAGNOSIS — C50412 Malignant neoplasm of upper-outer quadrant of left female breast: Secondary | ICD-10-CM | POA: Diagnosis not present

## 2021-12-22 DIAGNOSIS — C50112 Malignant neoplasm of central portion of left female breast: Secondary | ICD-10-CM | POA: Diagnosis not present

## 2021-12-22 DIAGNOSIS — Z510 Encounter for antineoplastic radiation therapy: Secondary | ICD-10-CM | POA: Diagnosis not present

## 2021-12-23 ENCOUNTER — Ambulatory Visit
Admission: RE | Admit: 2021-12-23 | Discharge: 2021-12-23 | Disposition: A | Discharge status: Home / Self Care | Payer: Medicare HMO | Source: Ambulatory Visit | Attending: Radiation Oncology | Admitting: Radiation Oncology

## 2021-12-23 DIAGNOSIS — Z170 Estrogen receptor positive status [ER+]: Secondary | ICD-10-CM

## 2021-12-23 DIAGNOSIS — C50412 Malignant neoplasm of upper-outer quadrant of left female breast: Secondary | ICD-10-CM | POA: Diagnosis not present

## 2021-12-23 DIAGNOSIS — Z510 Encounter for antineoplastic radiation therapy: Secondary | ICD-10-CM | POA: Diagnosis not present

## 2021-12-23 DIAGNOSIS — C50112 Malignant neoplasm of central portion of left female breast: Secondary | ICD-10-CM | POA: Diagnosis not present

## 2021-12-23 MED ORDER — RADIAPLEXRX EX GEL
1.0000 "application " | Freq: Once | CUTANEOUS | Status: AC
  Administered 2021-12-23: 1 via TOPICAL

## 2021-12-24 ENCOUNTER — Ambulatory Visit
Admission: RE | Admit: 2021-12-24 | Discharge: 2021-12-24 | Disposition: A | Discharge status: Home / Self Care | Payer: Medicare HMO | Source: Ambulatory Visit | Attending: Radiation Oncology | Admitting: Radiation Oncology

## 2021-12-24 ENCOUNTER — Other Ambulatory Visit: Payer: Self-pay

## 2021-12-24 DIAGNOSIS — C50412 Malignant neoplasm of upper-outer quadrant of left female breast: Secondary | ICD-10-CM | POA: Diagnosis not present

## 2021-12-24 DIAGNOSIS — Z170 Estrogen receptor positive status [ER+]: Secondary | ICD-10-CM | POA: Diagnosis not present

## 2021-12-24 DIAGNOSIS — Z510 Encounter for antineoplastic radiation therapy: Secondary | ICD-10-CM | POA: Diagnosis not present

## 2021-12-24 DIAGNOSIS — C50112 Malignant neoplasm of central portion of left female breast: Secondary | ICD-10-CM | POA: Diagnosis not present

## 2021-12-25 ENCOUNTER — Ambulatory Visit
Admission: RE | Admit: 2021-12-25 | Discharge: 2021-12-25 | Disposition: A | Discharge status: Home / Self Care | Payer: Medicare HMO | Source: Ambulatory Visit | Attending: Radiation Oncology | Admitting: Radiation Oncology

## 2021-12-25 DIAGNOSIS — Z170 Estrogen receptor positive status [ER+]: Secondary | ICD-10-CM | POA: Diagnosis not present

## 2021-12-25 DIAGNOSIS — C50112 Malignant neoplasm of central portion of left female breast: Secondary | ICD-10-CM | POA: Diagnosis not present

## 2021-12-25 DIAGNOSIS — J301 Allergic rhinitis due to pollen: Secondary | ICD-10-CM | POA: Diagnosis not present

## 2021-12-25 DIAGNOSIS — J3089 Other allergic rhinitis: Secondary | ICD-10-CM | POA: Diagnosis not present

## 2021-12-25 DIAGNOSIS — J4540 Moderate persistent asthma, uncomplicated: Secondary | ICD-10-CM | POA: Diagnosis not present

## 2021-12-25 DIAGNOSIS — Z510 Encounter for antineoplastic radiation therapy: Secondary | ICD-10-CM | POA: Diagnosis not present

## 2021-12-25 DIAGNOSIS — C50412 Malignant neoplasm of upper-outer quadrant of left female breast: Secondary | ICD-10-CM | POA: Diagnosis not present

## 2021-12-26 ENCOUNTER — Other Ambulatory Visit: Payer: Self-pay

## 2021-12-26 ENCOUNTER — Ambulatory Visit
Admission: RE | Admit: 2021-12-26 | Discharge: 2021-12-26 | Disposition: A | Discharge status: Home / Self Care | Payer: Medicare HMO | Source: Ambulatory Visit | Attending: Radiation Oncology | Admitting: Radiation Oncology

## 2021-12-26 DIAGNOSIS — C50112 Malignant neoplasm of central portion of left female breast: Secondary | ICD-10-CM | POA: Diagnosis not present

## 2021-12-26 DIAGNOSIS — Z170 Estrogen receptor positive status [ER+]: Secondary | ICD-10-CM | POA: Diagnosis not present

## 2021-12-26 DIAGNOSIS — Z510 Encounter for antineoplastic radiation therapy: Secondary | ICD-10-CM | POA: Diagnosis not present

## 2021-12-26 DIAGNOSIS — C50412 Malignant neoplasm of upper-outer quadrant of left female breast: Secondary | ICD-10-CM | POA: Diagnosis not present

## 2021-12-29 ENCOUNTER — Other Ambulatory Visit: Payer: Self-pay

## 2021-12-29 ENCOUNTER — Ambulatory Visit
Admission: RE | Admit: 2021-12-29 | Discharge: 2021-12-29 | Disposition: A | Discharge status: Home / Self Care | Payer: Medicare HMO | Source: Ambulatory Visit | Attending: Radiation Oncology | Admitting: Radiation Oncology

## 2021-12-29 DIAGNOSIS — S93491A Sprain of other ligament of right ankle, initial encounter: Secondary | ICD-10-CM | POA: Diagnosis not present

## 2021-12-29 DIAGNOSIS — Z170 Estrogen receptor positive status [ER+]: Secondary | ICD-10-CM | POA: Diagnosis not present

## 2021-12-29 DIAGNOSIS — Z510 Encounter for antineoplastic radiation therapy: Secondary | ICD-10-CM | POA: Diagnosis not present

## 2021-12-29 DIAGNOSIS — C50412 Malignant neoplasm of upper-outer quadrant of left female breast: Secondary | ICD-10-CM | POA: Diagnosis not present

## 2021-12-29 DIAGNOSIS — S93602A Unspecified sprain of left foot, initial encounter: Secondary | ICD-10-CM | POA: Diagnosis not present

## 2021-12-29 DIAGNOSIS — C50112 Malignant neoplasm of central portion of left female breast: Secondary | ICD-10-CM | POA: Diagnosis not present

## 2021-12-30 ENCOUNTER — Ambulatory Visit: Payer: Medicare HMO | Admitting: Radiation Oncology

## 2021-12-30 ENCOUNTER — Ambulatory Visit
Admission: RE | Admit: 2021-12-30 | Discharge: 2021-12-30 | Disposition: A | Discharge status: Home / Self Care | Payer: Medicare HMO | Source: Ambulatory Visit | Attending: Radiation Oncology | Admitting: Radiation Oncology

## 2021-12-30 ENCOUNTER — Other Ambulatory Visit: Payer: Self-pay

## 2021-12-30 DIAGNOSIS — C50412 Malignant neoplasm of upper-outer quadrant of left female breast: Secondary | ICD-10-CM | POA: Diagnosis not present

## 2021-12-30 DIAGNOSIS — Z170 Estrogen receptor positive status [ER+]: Secondary | ICD-10-CM | POA: Diagnosis not present

## 2021-12-30 DIAGNOSIS — C50112 Malignant neoplasm of central portion of left female breast: Secondary | ICD-10-CM | POA: Diagnosis not present

## 2021-12-30 DIAGNOSIS — Z510 Encounter for antineoplastic radiation therapy: Secondary | ICD-10-CM | POA: Diagnosis not present

## 2021-12-30 LAB — RAD ONC ARIA SESSION SUMMARY
Course Elapsed Days: 14
Plan Fractions Treated to Date: 11
Plan Prescribed Dose Per Fraction: 2.67 Gy
Plan Total Fractions Prescribed: 15
Plan Total Prescribed Dose: 40.05 Gy
Reference Point Dosage Given to Date: 29.37 Gy
Reference Point Session Dosage Given: 2.67 Gy
Session Number: 11

## 2021-12-30 NOTE — Progress Notes (Signed)
? ?Patient Care Team: ?Eulas Post, MD as PCP - General (Family Medicine) ?Barbaraann Rondo, MD (Obstetrics and Gynecology) ?Mauro Kaufmann, RN as Oncology Nurse Navigator ?Rockwell Germany, RN as Oncology Nurse Navigator ? ?DIAGNOSIS:  ?Encounter Diagnosis  ?Name Primary?  ? Malignant neoplasm of upper-outer quadrant of left breast in female, estrogen receptor positive ([place])   ? ? ?SUMMARY OF ONCOLOGIC HISTORY: ?Oncology History  ?Malignant neoplasm of upper-outer quadrant of left breast in female, estrogen receptor positive ([place])  ?08/20/2021 Initial Diagnosis  ? Screening mammogram detected possible distortion in the left breast: Biopsy: Atypical lobular hyperplasia ?  ?11/04/2021 Surgery  ? Left lumpectomy: Grade 2 invasive lobular cancer pleomorphic features, 5 mm, margins negative, ER 100%, PR 95%, Ki-67 2%, HER2 2+ by IHC, FISH negative, ratio 1.31 ?  ? ? ?CHIEF COMPLIANT: Follow-up after radiation ? ?INTERVAL HISTORY: Laura [name] is a 66 y.o. female is here because of recent diagnosis of left breast w/seed, lumpectomy. She presents to the clinic today for a follow-up after radiation.  She does have radiation dermatitis but otherwise doing quite well.  She had recently fractured her foot and is now on a boot. ? ? ?ALLERGIES:  is allergic to other. ? ?MEDICATIONS:  ?Current Outpatient Medications  ?Medication Sig Dispense Refill  ? albuterol (VENTOLIN HFA) 108 (90 Base) MCG/ACT inhaler Inhale 2 puffs into the lungs every 6 (six) hours as needed for wheezing or shortness of breath. 8 g 2  ? amLODipine (NORVASC) 5 MG tablet Take 1 tablet (5 mg total) by mouth daily. 90 tablet 3  ? Ascorbic Acid (VITAMIN C) 100 MG CHEW     ? Aspirin (VAZALORE) 81 MG CAPS 1 tablet    ? calcium carbonate (OS-CAL - DOSED IN MG OF ELEMENTAL CALCIUM) 1250 (500 Ca) MG tablet Take 1 tablet by mouth.    ? Fiber CHEW Chew 2 tablets by mouth every morning.     ? fluticasone (FLONASE) 50 MCG/ACT nasal spray Place 1 spray into both  nostrils daily as needed for allergies.     ? losartan (COZAAR) 100 MG tablet Take 1 tablet (100 mg total) by mouth daily. 90 tablet 3  ? Multiple Vitamin (MULTIVITAMIN) tablet Take 1 tablet by mouth daily.    ? valACYclovir (VALTREX) 1000 MG tablet Take two tablets by mouth at onset of cold sore and two tablets 12 hours later. 40 tablet 1  ? vitamin E 400 UNIT capsule Take 400 Units by mouth daily.    ? ?No current facility-administered medications for this visit.  ? ? ?PHYSICAL EXAMINATION: ?ECOG PERFORMANCE STATUS: 1 - Symptomatic but completely ambulatory ? ?Vitals:  ? 01/13/22 0932  ?BP: (!) 142/75  ?Pulse: 84  ?Resp: 16  ?Temp: 97.8 ?F (36.6 ?C)  ?SpO2: 100%  ? ?Filed Weights  ? 01/13/22 0932  ?Weight: 181 lb 14.4 oz (82.5 kg)  ? ?  ? ?LABORATORY DATA:  ?I have reviewed the data as listed ? ?  Latest Ref Rng & Units 08/05/2021  ?  8:38 AM 07/31/2020  ?  9:04 AM 07/31/2019  ?  8:59 AM  ?CMP  ?Glucose 70 - 99 mg/dL 103   108   109    ?BUN 6 - 23 mg/dL '17   15   13    ' ?Creatinine 0.40 - 1.20 mg/dL 0.68   0.66   0.60    ?Sodium 135 - 145 mEq/L 138   142   140    ?Potassium  3.5 - 5.1 mEq/L 4.0   4.7   3.9    ?Chloride 96 - 112 mEq/L 103   105   105    ?CO2 19 - 32 mEq/L '27   28   26    ' ?Calcium 8.4 - 10.5 mg/dL 9.5   10.1   9.6    ?Total Protein 6.0 - 8.3 g/dL 8.0   7.6   7.5    ?Total Bilirubin 0.2 - 1.2 mg/dL 0.6   0.6   0.5    ?Alkaline Phos 39 - 117 U/L 102    103    ?AST 0 - 37 U/L '21   23   20    ' ?ALT 0 - 35 U/L '21   26   23    ' ? ? ?Lab Results  ?Component Value Date  ? WBC 8.7 08/05/2021  ? HGB 14.2 08/05/2021  ? HCT 42.7 08/05/2021  ? MCV 89.6 08/05/2021  ? PLT 289.0 08/05/2021  ? NEUTROABS 4.7 08/05/2021  ? ? ?ASSESSMENT & PLAN:  ?Malignant neoplasm of upper-outer quadrant of left breast in female, estrogen receptor positive ([place]) ?11/04/2021: Screening mammogram detected possible distortion in the left breast initial biopsy was ALH, lumpectomy revealed grade 2 invasive lobular cancer with pleomorphic  features measuring 5 mm size, margins negative, ER 100%, PR 95%, Ki-67 2%, HER2 negative (2+ by IHC but FISH negative) ?12/17/2021-01/13/2022: Adjuvant radiation ? ?Treatment plan: Adjuvant antiestrogen therapy with tamoxifen (because of osteoporosis) ? 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 ?Therefore we started her on tamoxifen. ? ?Osteoporosis: T score -2.8 in 2019: I recommended that we start her on Prolia along with calcium and vitamin D.  She will come back next week to receive Prolia injection.  I counseled her about risks and benefits of Prolia. ? ?Return to clinic in 3 months for survivorship care plan visit ?I will see her back in 6 months for second dose of Prolia along with labs and follow-up.  ? ? ? ?No orders of the defined types were placed in this encounter. ? ?The patient has a good understanding of the overall plan. she agrees with it. she will call with any problems that may develop before the next visit here. ?Total time spent: 30 mins including face to face time and time spent for planning, charting and co-ordination of care ? ? Harriette Ohara, MD ?01/13/22 ? ? ? I Gardiner Coins am scribing for Dr. Lindi Adie ? ?I have reviewed the above documentation for accuracy and completeness, and I agree with the above. ?. ?

## 2021-12-31 ENCOUNTER — Other Ambulatory Visit: Payer: Self-pay

## 2021-12-31 ENCOUNTER — Ambulatory Visit
Admission: RE | Admit: 2021-12-31 | Discharge: 2021-12-31 | Disposition: A | Discharge status: Home / Self Care | Payer: Medicare HMO | Source: Ambulatory Visit | Attending: Radiation Oncology | Admitting: Radiation Oncology

## 2021-12-31 DIAGNOSIS — C50412 Malignant neoplasm of upper-outer quadrant of left female breast: Secondary | ICD-10-CM | POA: Diagnosis not present

## 2021-12-31 DIAGNOSIS — Z510 Encounter for antineoplastic radiation therapy: Secondary | ICD-10-CM | POA: Diagnosis not present

## 2021-12-31 DIAGNOSIS — C50112 Malignant neoplasm of central portion of left female breast: Secondary | ICD-10-CM | POA: Diagnosis not present

## 2021-12-31 DIAGNOSIS — Z170 Estrogen receptor positive status [ER+]: Secondary | ICD-10-CM | POA: Diagnosis not present

## 2021-12-31 LAB — RAD ONC ARIA SESSION SUMMARY
Course Elapsed Days: 15
Plan Fractions Treated to Date: 12
Plan Prescribed Dose Per Fraction: 2.67 Gy
Plan Total Fractions Prescribed: 15
Plan Total Prescribed Dose: 40.05 Gy
Reference Point Dosage Given to Date: 32.04 Gy
Reference Point Session Dosage Given: 2.67 Gy
Session Number: 12

## 2022-01-01 ENCOUNTER — Ambulatory Visit
Admission: RE | Admit: 2022-01-01 | Discharge: 2022-01-01 | Disposition: A | Discharge status: Home / Self Care | Payer: Medicare HMO | Source: Ambulatory Visit | Attending: Radiation Oncology | Admitting: Radiation Oncology

## 2022-01-01 ENCOUNTER — Other Ambulatory Visit: Payer: Self-pay

## 2022-01-01 DIAGNOSIS — Z510 Encounter for antineoplastic radiation therapy: Secondary | ICD-10-CM | POA: Diagnosis not present

## 2022-01-01 DIAGNOSIS — C50112 Malignant neoplasm of central portion of left female breast: Secondary | ICD-10-CM | POA: Diagnosis not present

## 2022-01-01 DIAGNOSIS — Z170 Estrogen receptor positive status [ER+]: Secondary | ICD-10-CM | POA: Diagnosis not present

## 2022-01-01 DIAGNOSIS — C50412 Malignant neoplasm of upper-outer quadrant of left female breast: Secondary | ICD-10-CM | POA: Diagnosis not present

## 2022-01-01 LAB — RAD ONC ARIA SESSION SUMMARY
Course Elapsed Days: 16
Plan Fractions Treated to Date: 13
Plan Prescribed Dose Per Fraction: 2.67 Gy
Plan Total Fractions Prescribed: 15
Plan Total Prescribed Dose: 40.05 Gy
Reference Point Dosage Given to Date: 34.71 Gy
Reference Point Session Dosage Given: 2.67 Gy
Session Number: 13

## 2022-01-02 ENCOUNTER — Ambulatory Visit
Admission: RE | Admit: 2022-01-02 | Discharge: 2022-01-02 | Disposition: A | Discharge status: Home / Self Care | Payer: Medicare HMO | Source: Ambulatory Visit | Attending: Radiation Oncology | Admitting: Radiation Oncology

## 2022-01-02 ENCOUNTER — Other Ambulatory Visit: Payer: Self-pay

## 2022-01-02 DIAGNOSIS — Z170 Estrogen receptor positive status [ER+]: Secondary | ICD-10-CM | POA: Diagnosis not present

## 2022-01-02 DIAGNOSIS — C50112 Malignant neoplasm of central portion of left female breast: Secondary | ICD-10-CM | POA: Diagnosis not present

## 2022-01-02 DIAGNOSIS — C50412 Malignant neoplasm of upper-outer quadrant of left female breast: Secondary | ICD-10-CM | POA: Diagnosis not present

## 2022-01-02 DIAGNOSIS — Z510 Encounter for antineoplastic radiation therapy: Secondary | ICD-10-CM | POA: Diagnosis not present

## 2022-01-02 LAB — RAD ONC ARIA SESSION SUMMARY
Course Elapsed Days: 17
Plan Fractions Treated to Date: 14
Plan Prescribed Dose Per Fraction: 2.67 Gy
Plan Total Fractions Prescribed: 15
Plan Total Prescribed Dose: 40.05 Gy
Reference Point Dosage Given to Date: 37.38 Gy
Reference Point Session Dosage Given: 2.67 Gy
Session Number: 14

## 2022-01-05 ENCOUNTER — Ambulatory Visit
Admission: RE | Admit: 2022-01-05 | Discharge: 2022-01-05 | Disposition: A | Discharge status: Home / Self Care | Payer: Medicare HMO | Source: Ambulatory Visit | Attending: Radiation Oncology | Admitting: Radiation Oncology

## 2022-01-05 ENCOUNTER — Other Ambulatory Visit: Payer: Self-pay

## 2022-01-05 DIAGNOSIS — C50412 Malignant neoplasm of upper-outer quadrant of left female breast: Secondary | ICD-10-CM | POA: Diagnosis not present

## 2022-01-05 DIAGNOSIS — Z170 Estrogen receptor positive status [ER+]: Secondary | ICD-10-CM | POA: Diagnosis not present

## 2022-01-05 DIAGNOSIS — C50112 Malignant neoplasm of central portion of left female breast: Secondary | ICD-10-CM | POA: Diagnosis not present

## 2022-01-05 DIAGNOSIS — Z510 Encounter for antineoplastic radiation therapy: Secondary | ICD-10-CM | POA: Diagnosis not present

## 2022-01-05 LAB — RAD ONC ARIA SESSION SUMMARY
Course Elapsed Days: 20
Plan Fractions Treated to Date: 15
Plan Prescribed Dose Per Fraction: 2.67 Gy
Plan Total Fractions Prescribed: 15
Plan Total Prescribed Dose: 40.05 Gy
Reference Point Dosage Given to Date: 40.05 Gy
Reference Point Session Dosage Given: 2.67 Gy
Session Number: 15

## 2022-01-06 ENCOUNTER — Other Ambulatory Visit: Payer: Self-pay

## 2022-01-06 ENCOUNTER — Ambulatory Visit
Admission: RE | Admit: 2022-01-06 | Discharge: 2022-01-06 | Disposition: A | Discharge status: Home / Self Care | Payer: Medicare HMO | Source: Ambulatory Visit | Attending: Radiation Oncology | Admitting: Radiation Oncology

## 2022-01-06 DIAGNOSIS — C50112 Malignant neoplasm of central portion of left female breast: Secondary | ICD-10-CM | POA: Diagnosis not present

## 2022-01-06 DIAGNOSIS — S82891A Other fracture of right lower leg, initial encounter for closed fracture: Secondary | ICD-10-CM | POA: Diagnosis not present

## 2022-01-06 DIAGNOSIS — Z510 Encounter for antineoplastic radiation therapy: Secondary | ICD-10-CM | POA: Diagnosis not present

## 2022-01-06 DIAGNOSIS — C50412 Malignant neoplasm of upper-outer quadrant of left female breast: Secondary | ICD-10-CM | POA: Diagnosis not present

## 2022-01-06 DIAGNOSIS — Z170 Estrogen receptor positive status [ER+]: Secondary | ICD-10-CM | POA: Diagnosis not present

## 2022-01-06 LAB — RAD ONC ARIA SESSION SUMMARY
Course Elapsed Days: 21
Plan Fractions Treated to Date: 1
Plan Prescribed Dose Per Fraction: 2 Gy
Plan Total Fractions Prescribed: 6
Plan Total Prescribed Dose: 12 Gy
Reference Point Dosage Given to Date: 42.05 Gy
Reference Point Session Dosage Given: 2 Gy
Session Number: 16

## 2022-01-07 ENCOUNTER — Other Ambulatory Visit: Payer: Self-pay

## 2022-01-07 ENCOUNTER — Ambulatory Visit
Admission: RE | Admit: 2022-01-07 | Discharge: 2022-01-07 | Disposition: A | Discharge status: Home / Self Care | Payer: Medicare HMO | Source: Ambulatory Visit | Attending: Radiation Oncology | Admitting: Radiation Oncology

## 2022-01-07 DIAGNOSIS — Z170 Estrogen receptor positive status [ER+]: Secondary | ICD-10-CM | POA: Diagnosis not present

## 2022-01-07 DIAGNOSIS — Z510 Encounter for antineoplastic radiation therapy: Secondary | ICD-10-CM | POA: Diagnosis not present

## 2022-01-07 DIAGNOSIS — C50412 Malignant neoplasm of upper-outer quadrant of left female breast: Secondary | ICD-10-CM | POA: Diagnosis not present

## 2022-01-07 LAB — RAD ONC ARIA SESSION SUMMARY
Course Elapsed Days: 22
Plan Fractions Treated to Date: 2
Plan Prescribed Dose Per Fraction: 2 Gy
Plan Total Fractions Prescribed: 6
Plan Total Prescribed Dose: 12 Gy
Reference Point Dosage Given to Date: 44.05 Gy
Reference Point Session Dosage Given: 2 Gy
Session Number: 17

## 2022-01-08 ENCOUNTER — Other Ambulatory Visit: Payer: Self-pay

## 2022-01-08 ENCOUNTER — Ambulatory Visit
Admission: RE | Admit: 2022-01-08 | Discharge: 2022-01-08 | Disposition: A | Discharge status: Home / Self Care | Payer: Medicare HMO | Source: Ambulatory Visit | Attending: Radiation Oncology | Admitting: Radiation Oncology

## 2022-01-08 DIAGNOSIS — Z510 Encounter for antineoplastic radiation therapy: Secondary | ICD-10-CM | POA: Diagnosis not present

## 2022-01-08 DIAGNOSIS — Z170 Estrogen receptor positive status [ER+]: Secondary | ICD-10-CM | POA: Diagnosis not present

## 2022-01-08 DIAGNOSIS — C50412 Malignant neoplasm of upper-outer quadrant of left female breast: Secondary | ICD-10-CM | POA: Diagnosis not present

## 2022-01-08 LAB — RAD ONC ARIA SESSION SUMMARY
Course Elapsed Days: 23
Plan Fractions Treated to Date: 3
Plan Prescribed Dose Per Fraction: 2 Gy
Plan Total Fractions Prescribed: 6
Plan Total Prescribed Dose: 12 Gy
Reference Point Dosage Given to Date: 46.05 Gy
Reference Point Session Dosage Given: 2 Gy
Session Number: 18

## 2022-01-09 ENCOUNTER — Other Ambulatory Visit: Payer: Self-pay

## 2022-01-09 ENCOUNTER — Ambulatory Visit (INDEPENDENT_AMBULATORY_CARE_PROVIDER_SITE_OTHER): Payer: Medicare HMO | Admitting: Family Medicine

## 2022-01-09 ENCOUNTER — Encounter: Payer: Self-pay | Admitting: Family Medicine

## 2022-01-09 ENCOUNTER — Ambulatory Visit
Admission: RE | Admit: 2022-01-09 | Discharge: 2022-01-09 | Disposition: A | Discharge status: Home / Self Care | Payer: Medicare HMO | Source: Ambulatory Visit | Attending: Radiation Oncology | Admitting: Radiation Oncology

## 2022-01-09 VITALS — BP 156/82 | HR 72 | Temp 97.7°F | Ht 67.0 in | Wt 181.8 lb

## 2022-01-09 DIAGNOSIS — I10 Essential (primary) hypertension: Secondary | ICD-10-CM | POA: Diagnosis not present

## 2022-01-09 DIAGNOSIS — C50412 Malignant neoplasm of upper-outer quadrant of left female breast: Secondary | ICD-10-CM | POA: Diagnosis not present

## 2022-01-09 DIAGNOSIS — Z510 Encounter for antineoplastic radiation therapy: Secondary | ICD-10-CM | POA: Diagnosis not present

## 2022-01-09 DIAGNOSIS — Z170 Estrogen receptor positive status [ER+]: Secondary | ICD-10-CM | POA: Diagnosis not present

## 2022-01-09 LAB — RAD ONC ARIA SESSION SUMMARY
Course Elapsed Days: 24
Plan Fractions Treated to Date: 4
Plan Prescribed Dose Per Fraction: 2 Gy
Plan Total Fractions Prescribed: 6
Plan Total Prescribed Dose: 12 Gy
Reference Point Dosage Given to Date: 48.05 Gy
Reference Point Session Dosage Given: 2 Gy
Session Number: 19

## 2022-01-09 MED ORDER — AMLODIPINE BESYLATE 5 MG PO TABS: 5.0000 mg | ORAL_TABLET | Freq: Every day | ORAL | 3 refills | Status: DC

## 2022-01-09 NOTE — Progress Notes (Signed)
? ?Established Patient Office Visit ? ?Subjective   ?Patient ID: Laura [name], female    DOB: 01-20-1956  Age: 66 y.o. MRN: [id number] ? ?Chief Complaint  ?Patient presents with  ? Follow-up  ? ? ?HPI ? ? ?Laura [name] is seen for follow-up regarding hypertension.  Recently diagnosed with breast cancer.  She is undergoing radiation therapy.  She has not been able to walk much recently because of fatigue issues and also has severe tendinitis of her ankle and is then foot/ankle immobilizer per orthopedics. ? ?Last visit we increased her losartan to 100 mg daily.  She has had some decent readings at home with some blood pressure readings 295J to 884Z systolic and occasionally higher.  She feels like her blood pressure will improve with weight loss and exercise. ? ?Past Medical History:  ?Diagnosis Date  ? ABNORMAL ELECTROCARDIOGRAM 08/05/2010  ? 02-19-15 EKG shows NSR. Pt voices no issues with heart related problems.  ? ACNE, MILD 02/17/2008  ? not a problem so much 06-18-15  ? ALLERGIC RHINITIS 01/25/2008  ? Allergy   ? ASTHMA 01/25/2008  ? Asthma   ? 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 CONSTIPATION 02/21/2008  ? uses fiber to control  ? ESOPHAGEAL STRICTURE 02/17/2008  ? dilation done - no problems since.  ? GERD 01/25/2008  ? uses Prevacid as needed  ? HYPERGLYCEMIA   ? HYPERTENSION 08/05/2010  ? Impaired glucose tolerance 01/11/2011  ? METATARSALGIA 11/08/2007  ? work related- no recent issues"wearing steel toe shoes at the time"  ? PONV (postoperative nausea and vomiting)   ? Previous back surgery 10/26/17, 05/26/18  ? RECTAL BLEEDING 01/04/2008  ? 06-18-15 no problems now.  ? Tendonitis   ? right wrist-has soft splint in place.  ? Tibialis tendinitis 05/13/2010  ? Unspecified disorder of liver 01/19/2008  ? evaluated and thought to be related to pain med use at current time- no further issues noted.  ? VENTRICULAR HYPERTROPHY, LEFT 09/23/2010  ?  Wears glasses   ? ?Past Surgical History:  ?Procedure Laterality Date  ? ABDOMINAL HYSTERECTOMY  2002  ? TAH-LTSO-LOA  ? ARTHROSCOPIC REPAIR ACL Right 10/1998  ? right knee   ? BACK SURGERY  2019  ? BREAST EXCISIONAL BIOPSY Left 2015  ? benign  ? BREAST SURGERY Left   ? lumpectomy-benign 2015  ? CARDIOVASCULAR STRESS TEST  2011  ? CARPAL TUNNEL RELEASE  2014  ? left  ? CHOLECYSTECTOMY  1985  ? open  ? COLONOSCOPY    ? COLONOSCOPY W/ POLYPECTOMY    ? DILATION AND CURETTAGE OF UTERUS    ? ELBOW ARTHROPLASTY  2014  ? left  ? ESOPHAGOGASTRODUODENOSCOPY  04/01/1998  ? KNEE ARTHROSCOPY Bilateral   ? meniscus tear-left.  ? NASAL SINUS SURGERY    ? s/p  ? ORIF ELBOW FRACTURE  12/23/2011  ? Procedure: OPEN REDUCTION INTERNAL FIXATION (ORIF) ELBOW/OLECRANON FRACTURE;  Surgeon: Linna Hoff, MD;  Location: [place];  Service: Orthopedics;  Laterality: Left;  LEFT ELBOW ORIF OF PROXIMAL OLECRANON AND RADIAL HEAD, POSSIBLE ARTHROPLASTY  ? OVARIAN CYST REMOVAL    ? RADIOACTIVE SEED GUIDED EXCISIONAL BREAST BIOPSY Left 11/04/2021  ? Procedure: RADIOACTIVE SEED GUIDED EXCISIONAL LEFT BREAST BIOPSY;  Surgeon: Rolm Bookbinder, MD;  Location: [place];  Service: General;  Laterality: Left;  ? TOTAL KNEE ARTHROPLASTY Right 06/24/2015  ? Procedure: RIGHT TOTAL KNEE ARTHROPLASTY;  Surgeon: Gaynelle Arabian, MD;  [facility];  Service: Orthopedics;  Laterality: Right;  ? TRANSFORAMINAL LUMBAR INTERBODY FUSION (TLIF) WITH PEDICLE SCREW FIXATION 1 LEVEL Left 03/03/2019  ? Procedure: Left Lumbar four-five Redo Microdiscectomy with transforaminal lumbar interbody fusion;  Surgeon: Erline Levine, MD;  Location: [place];  Service: Neurosurgery;  Laterality: Left;  ? UPPER GASTROINTESTINAL ENDOSCOPY    ? US ECHOCARDIOGRAPHY  2011  ? ? reports that she has never smoked. She has never used smokeless tobacco. She reports that she does not drink alcohol and does not use drugs. ?family history includes Cancer in an other family member;  Diabetes in her mother; Heart disease (age of onset: 63) in her father; Thyroid disease in her mother. ?Allergies  ?Allergen Reactions  ? Other Rash  ?  dermabond  ? ? ?Review of Systems  ?Respiratory:  Negative for shortness of breath.   ?Cardiovascular:  Negative for chest pain.  ?Neurological:  Negative for headaches.  ? ?  ?Objective:  ?  ? ?BP (!) 156/82 (BP Location: Left Arm, Patient Position: Sitting, Cuff Size: Normal)   Pulse 72   Temp 97.7 ?F (36.5 ?C) (Oral)   Ht '5\' 7"'$  (1.702 m)   Wt 181 lb 12.8 oz (82.5 kg)   SpO2 96%   BMI 28.47 kg/m?  ?BP Readings from Last 3 Encounters:  ?01/09/22 (!) 156/82  ?12/08/21 (!) 160/80  ?12/02/21 (!) 173/90  ? ?  ? ?Physical Exam ?Vitals reviewed.  ?Cardiovascular:  ?   Rate and Rhythm: Normal rate and regular rhythm.  ?Pulmonary:  ?   Effort: Pulmonary effort is normal.  ?   Breath sounds: Normal breath sounds.  ?Musculoskeletal:  ?   Right lower leg: No edema.  ?   Left lower leg: No edema.  ?Neurological:  ?   Mental Status: She is alert.  ? ? ? ?No results found for any visits on 01/09/22. ? ? ? ?The 10-year ASCVD risk score (Arnett DK, et al., 2019) is: 10.3% ? ?  ?Assessment & Plan:  ? ?Hypertension.  Poorly controlled.  Patient already on losartan 100 mg daily.  As per previous note, start amlodipine 5 mg daily.  Hopefully her blood pressure will improve after she can become more mobile and start walking again. ? ?-Continue losartan 100 mg daily and add amlodipine 5 mg daily ?-Set up 6-week follow-up to reassess ? ?Return in about 6 weeks (around 02/20/2022).  ? ? ?Carolann Littler, MD ? ?

## 2022-01-10 DIAGNOSIS — M25571 Pain in right ankle and joints of right foot: Secondary | ICD-10-CM | POA: Diagnosis not present

## 2022-01-12 ENCOUNTER — Other Ambulatory Visit: Payer: Self-pay

## 2022-01-12 ENCOUNTER — Ambulatory Visit
Admission: RE | Admit: 2022-01-12 | Discharge: 2022-01-12 | Disposition: A | Discharge status: Home / Self Care | Payer: Medicare HMO | Source: Ambulatory Visit | Attending: Radiation Oncology | Admitting: Radiation Oncology

## 2022-01-12 DIAGNOSIS — Z170 Estrogen receptor positive status [ER+]: Secondary | ICD-10-CM | POA: Insufficient documentation

## 2022-01-12 DIAGNOSIS — S92114A Nondisplaced fracture of neck of right talus, initial encounter for closed fracture: Secondary | ICD-10-CM | POA: Diagnosis not present

## 2022-01-12 DIAGNOSIS — C50412 Malignant neoplasm of upper-outer quadrant of left female breast: Secondary | ICD-10-CM | POA: Insufficient documentation

## 2022-01-12 DIAGNOSIS — Z510 Encounter for antineoplastic radiation therapy: Secondary | ICD-10-CM | POA: Diagnosis not present

## 2022-01-12 DIAGNOSIS — C50112 Malignant neoplasm of central portion of left female breast: Secondary | ICD-10-CM | POA: Diagnosis not present

## 2022-01-12 DIAGNOSIS — M898X7 Other specified disorders of bone, ankle and foot: Secondary | ICD-10-CM | POA: Diagnosis not present

## 2022-01-12 LAB — RAD ONC ARIA SESSION SUMMARY
Course Elapsed Days: 27
Plan Fractions Treated to Date: 5
Plan Prescribed Dose Per Fraction: 2 Gy
Plan Total Fractions Prescribed: 6
Plan Total Prescribed Dose: 12 Gy
Reference Point Dosage Given to Date: 50.05 Gy
Reference Point Session Dosage Given: 2 Gy
Session Number: 20

## 2022-01-12 MED ORDER — RADIAPLEXRX EX GEL: Freq: Once | CUTANEOUS | Status: AC

## 2022-01-13 ENCOUNTER — Encounter: Payer: Self-pay | Admitting: *Deleted

## 2022-01-13 ENCOUNTER — Encounter: Payer: Self-pay | Admitting: Radiation Oncology

## 2022-01-13 ENCOUNTER — Other Ambulatory Visit: Payer: Self-pay

## 2022-01-13 ENCOUNTER — Ambulatory Visit
Admission: RE | Admit: 2022-01-13 | Discharge: 2022-01-13 | Disposition: A | Discharge status: Home / Self Care | Payer: Medicare HMO | Source: Ambulatory Visit | Attending: Radiation Oncology | Admitting: Radiation Oncology

## 2022-01-13 ENCOUNTER — Inpatient Hospital Stay: Payer: Medicare HMO

## 2022-01-13 ENCOUNTER — Inpatient Hospital Stay: Payer: Medicare HMO | Attending: Hematology and Oncology | Admitting: Hematology and Oncology

## 2022-01-13 ENCOUNTER — Other Ambulatory Visit: Payer: Self-pay | Admitting: *Deleted

## 2022-01-13 DIAGNOSIS — M810 Age-related osteoporosis without current pathological fracture: Secondary | ICD-10-CM | POA: Insufficient documentation

## 2022-01-13 DIAGNOSIS — C50412 Malignant neoplasm of upper-outer quadrant of left female breast: Secondary | ICD-10-CM | POA: Diagnosis not present

## 2022-01-13 DIAGNOSIS — Z79810 Long term (current) use of selective estrogen receptor modulators (SERMs): Secondary | ICD-10-CM | POA: Diagnosis not present

## 2022-01-13 DIAGNOSIS — Z170 Estrogen receptor positive status [ER+]: Secondary | ICD-10-CM

## 2022-01-13 LAB — CMP (CANCER CENTER ONLY)
ALT: 23 U/L (ref 0–44)
AST: 21 U/L (ref 15–41)
Albumin: 4.3 g/dL (ref 3.5–5.0)
Alkaline Phosphatase: 111 U/L (ref 38–126)
Anion gap: 6 (ref 5–15)
BUN: 13 mg/dL (ref 8–23)
CO2: 29 mmol/L (ref 22–32)
Calcium: 9.3 mg/dL (ref 8.9–10.3)
Chloride: 107 mmol/L (ref 98–111)
Creatinine: 0.61 mg/dL (ref 0.44–1.00)
GFR, Estimated: >60 mL/min (ref >60)
Glucose, Bld: 134 mg/dL — ABNORMAL HIGH (ref 70–99)
Potassium: 4 mmol/L (ref 3.5–5.1)
Sodium: 142 mmol/L (ref 135–145)
Total Bilirubin: 0.5 mg/dL (ref 0.3–1.2)
Total Protein: 7.2 g/dL (ref 6.5–8.1)

## 2022-01-13 LAB — CBC WITH DIFFERENTIAL (CANCER CENTER ONLY)
Abs Immature Granulocytes: 0.02 10*3/uL (ref 0.00–0.07)
Basophils Absolute: 0 10*3/uL (ref 0.0–0.1)
Basophils Relative: 1 %
Eosinophils Absolute: 0.1 10*3/uL (ref 0.0–0.5)
Eosinophils Relative: 2 %
HCT: 40.9 % (ref 36.0–46.0)
Hemoglobin: 13.3 g/dL (ref 12.0–15.0)
Immature Granulocytes: 0 %
Lymphocytes Relative: 39 %
Lymphs Abs: 2.2 10*3/uL (ref 0.7–4.0)
MCH: 29.6 pg (ref 26.0–34.0)
MCHC: 32.5 g/dL (ref 30.0–36.0)
MCV: 90.9 fL (ref 80.0–100.0)
Monocytes Absolute: 0.4 10*3/uL (ref 0.1–1.0)
Monocytes Relative: 8 %
Neutro Abs: 3 10*3/uL (ref 1.7–7.7)
Neutrophils Relative %: 50 %
Platelet Count: 279 10*3/uL (ref 150–400)
RBC: 4.5 MIL/uL (ref 3.87–5.11)
RDW: 13.2 % (ref 11.5–15.5)
WBC Count: 5.8 10*3/uL (ref 4.0–10.5)
nRBC: 0 % (ref 0.0–0.2)

## 2022-01-13 LAB — RAD ONC ARIA SESSION SUMMARY
Course Elapsed Days: 28
Plan Fractions Treated to Date: 6
Plan Prescribed Dose Per Fraction: 2 Gy
Plan Total Fractions Prescribed: 6
Plan Total Prescribed Dose: 12 Gy
Reference Point Dosage Given to Date: 52.05 Gy
Reference Point Session Dosage Given: 2 Gy
Session Number: 21

## 2022-01-13 NOTE — Assessment & Plan Note (Addendum)
11/04/2021: Screening mammogram detected possible distortion in the left breast initial biopsy was ALH, lumpectomy revealed grade 2 invasive lobular cancer with pleomorphic features measuring 5 mm size, margins negative, ER 100%, PR 95%, Ki-67 2%, HER2 negative (2+ by IHC but FISH negative) ?12/17/2021-01/13/2022: Adjuvant radiation ? ?Treatment plan: Adjuvant antiestrogen therapy with tamoxifen (because of osteoporosis) ? 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 ?Therefore we started her on tamoxifen. ? ?Osteoporosis: T score -2.8 in 2019: I recommended that we start her on Prolia along with calcium and vitamin D.  She will come back next week to receive Prolia injection.  I counseled her about risks and benefits of Prolia. ? ?Return to clinic in 3 months for survivorship care plan visit ?I will see her back in 6 months for second dose of Prolia along with labs and follow-up.? ?

## 2022-01-15 ENCOUNTER — Other Ambulatory Visit: Payer: Self-pay | Admitting: Hematology and Oncology

## 2022-01-16 NOTE — Progress Notes (Signed)
Per Dr. Lindi Adie, ok to use calcium level of 9.3 mg/dL from 5/2 for prolia injection on 01/19/22.  ?

## 2022-01-19 ENCOUNTER — Other Ambulatory Visit: Payer: Self-pay | Admitting: *Deleted

## 2022-01-19 ENCOUNTER — Other Ambulatory Visit: Payer: Self-pay | Admitting: Hematology and Oncology

## 2022-01-19 ENCOUNTER — Inpatient Hospital Stay: Payer: Medicare HMO

## 2022-01-19 MED ORDER — TAMOXIFEN CITRATE 20 MG PO TABS: 20.0000 mg | ORAL_TABLET | Freq: Every day | ORAL | 1 refills | Status: DC

## 2022-01-19 NOTE — Progress Notes (Signed)
Patient's insurance prefers Zometa. ?Therefore I discontinued Prolia and we will set her up for Zometa infusion once a year. ?

## 2022-01-21 ENCOUNTER — Other Ambulatory Visit: Payer: Self-pay

## 2022-01-21 ENCOUNTER — Inpatient Hospital Stay: Payer: Medicare HMO

## 2022-01-21 VITALS — BP 156/90 | HR 75 | Temp 98.1°F | Resp 18 | Wt 178.6 lb

## 2022-01-21 DIAGNOSIS — M810 Age-related osteoporosis without current pathological fracture: Secondary | ICD-10-CM

## 2022-01-21 DIAGNOSIS — Z170 Estrogen receptor positive status [ER+]: Secondary | ICD-10-CM | POA: Diagnosis not present

## 2022-01-21 DIAGNOSIS — Z79810 Long term (current) use of selective estrogen receptor modulators (SERMs): Secondary | ICD-10-CM | POA: Diagnosis not present

## 2022-01-21 DIAGNOSIS — C50412 Malignant neoplasm of upper-outer quadrant of left female breast: Secondary | ICD-10-CM | POA: Diagnosis not present

## 2022-01-21 MED ORDER — SODIUM CHLORIDE 0.9 % IV SOLN: Freq: Once | INTRAVENOUS | Status: AC

## 2022-01-21 MED ORDER — ZOLEDRONIC ACID 4 MG/100ML IV SOLN
4.0000 mg | Freq: Once | INTRAVENOUS | Status: AC
  Administered 2022-01-21: 4 mg via INTRAVENOUS
  Filled 2022-01-21: qty 100

## 2022-01-21 NOTE — Patient Instructions (Signed)

## 2022-01-22 ENCOUNTER — Emergency Department (HOSPITAL_COMMUNITY)
Admission: EM | Admit: 2022-01-22 | Discharge: 2022-01-22 | Disposition: A | Discharge status: Home / Self Care | Payer: Medicare HMO | Attending: Emergency Medicine | Admitting: Emergency Medicine

## 2022-01-22 ENCOUNTER — Emergency Department (HOSPITAL_COMMUNITY): Payer: Medicare HMO

## 2022-01-22 ENCOUNTER — Encounter (HOSPITAL_COMMUNITY): Payer: Self-pay

## 2022-01-22 ENCOUNTER — Other Ambulatory Visit: Payer: Self-pay

## 2022-01-22 DIAGNOSIS — Z853 Personal history of malignant neoplasm of breast: Secondary | ICD-10-CM | POA: Diagnosis not present

## 2022-01-22 DIAGNOSIS — T887XXA Unspecified adverse effect of drug or medicament, initial encounter: Secondary | ICD-10-CM | POA: Diagnosis not present

## 2022-01-22 DIAGNOSIS — Z7982 Long term (current) use of aspirin: Secondary | ICD-10-CM | POA: Diagnosis not present

## 2022-01-22 DIAGNOSIS — Z79899 Other long term (current) drug therapy: Secondary | ICD-10-CM | POA: Diagnosis not present

## 2022-01-22 DIAGNOSIS — R509 Fever, unspecified: Secondary | ICD-10-CM | POA: Diagnosis not present

## 2022-01-22 DIAGNOSIS — T50905A Adverse effect of unspecified drugs, medicaments and biological substances, initial encounter: Secondary | ICD-10-CM

## 2022-01-22 DIAGNOSIS — Z20822 Contact with and (suspected) exposure to covid-19: Secondary | ICD-10-CM | POA: Diagnosis not present

## 2022-01-22 DIAGNOSIS — R110 Nausea: Secondary | ICD-10-CM | POA: Insufficient documentation

## 2022-01-22 DIAGNOSIS — I10 Essential (primary) hypertension: Secondary | ICD-10-CM | POA: Insufficient documentation

## 2022-01-22 DIAGNOSIS — R6883 Chills (without fever): Secondary | ICD-10-CM | POA: Diagnosis not present

## 2022-01-22 LAB — CBC WITH DIFFERENTIAL/PLATELET
Abs Immature Granulocytes: 0.02 10*3/uL (ref 0.00–0.07)
Basophils Absolute: 0.1 10*3/uL (ref 0.0–0.1)
Basophils Relative: 1 %
Eosinophils Absolute: 0 10*3/uL (ref 0.0–0.5)
Eosinophils Relative: 0 %
HCT: 41 % (ref 36.0–46.0)
Hemoglobin: 13.4 g/dL (ref 12.0–15.0)
Immature Granulocytes: 0 %
Lymphocytes Relative: 7 %
Lymphs Abs: 0.7 10*3/uL (ref 0.7–4.0)
MCH: 30 pg (ref 26.0–34.0)
MCHC: 32.7 g/dL (ref 30.0–36.0)
MCV: 91.9 fL (ref 80.0–100.0)
Monocytes Absolute: 0.4 10*3/uL (ref 0.1–1.0)
Monocytes Relative: 4 %
Neutro Abs: 8.8 10*3/uL — ABNORMAL HIGH (ref 1.7–7.7)
Neutrophils Relative %: 88 %
Platelets: 250 10*3/uL (ref 150–400)
RBC: 4.46 MIL/uL (ref 3.87–5.11)
RDW: 13.1 % (ref 11.5–15.5)
WBC: 10 10*3/uL (ref 4.0–10.5)
nRBC: 0 % (ref 0.0–0.2)

## 2022-01-22 LAB — COMPREHENSIVE METABOLIC PANEL
ALT: 28 U/L (ref 0–44)
AST: 30 U/L (ref 15–41)
Albumin: 4.4 g/dL (ref 3.5–5.0)
Alkaline Phosphatase: 97 U/L (ref 38–126)
Anion gap: 9 (ref 5–15)
BUN: 16 mg/dL (ref 8–23)
CO2: 24 mmol/L (ref 22–32)
Calcium: 9 mg/dL (ref 8.9–10.3)
Chloride: 105 mmol/L (ref 98–111)
Creatinine, Ser: 0.6 mg/dL (ref 0.44–1.00)
GFR, Estimated: >60 mL/min (ref >60)
Glucose, Bld: 166 mg/dL — ABNORMAL HIGH (ref 70–99)
Potassium: 3.9 mmol/L (ref 3.5–5.1)
Sodium: 138 mmol/L (ref 135–145)
Total Bilirubin: 0.7 mg/dL (ref 0.3–1.2)
Total Protein: 7.7 g/dL (ref 6.5–8.1)

## 2022-01-22 LAB — URINALYSIS, ROUTINE W REFLEX MICROSCOPIC
Bilirubin Urine: NEGATIVE
Glucose, UA: NEGATIVE mg/dL
Hgb urine dipstick: NEGATIVE
Ketones, ur: NEGATIVE mg/dL
Nitrite: NEGATIVE
Protein, ur: NEGATIVE mg/dL
Specific Gravity, Urine: 1.017 (ref 1.005–1.030)
pH: 6 (ref 5.0–8.0)

## 2022-01-22 LAB — RESP PANEL BY RT-PCR (FLU A&B, COVID) ARPGX2
Influenza A by PCR: NEGATIVE
Influenza B by PCR: NEGATIVE
SARS Coronavirus 2 by RT PCR: NEGATIVE

## 2022-01-22 LAB — LACTIC ACID, PLASMA: Lactic Acid, Venous: 1.7 mmol/L (ref 0.5–1.9)

## 2022-01-22 MED ORDER — SODIUM CHLORIDE 0.9 % IV BOLUS
1000.0000 mL | Freq: Once | INTRAVENOUS | Status: AC
  Administered 2022-01-22: 1000 mL via INTRAVENOUS

## 2022-01-22 NOTE — ED Notes (Signed)
Discharge instructions reviewed, questions answered. Rx education provided. Pt states understanding and no further questions. Pt wheeled out to ride upon discharge. No s/s of distress noted. ?

## 2022-01-22 NOTE — Discharge Instructions (Signed)
Continue medications as previously prescribed. ? ?Take Tylenol 1000 mg rotated with ibuprofen 600 mg every 4 hours as needed for fever. ? ?We will call you if your blood cultures indicate you need further treatment or need to take additional action.  Return to the emergency department if symptoms significantly worsen or change. ?

## 2022-01-22 NOTE — ED Provider Notes (Signed)
?[facility] DEPT ?Provider Note ? ? ?CSN: [id number] ?Arrival date & time: 01/22/22  0116 ? ?  ? ?History ? ?Chief Complaint  ?Patient presents with  ? Medication Reaction  ? ? ?Laura [name] is a 66 y.o. female. ? ?Patient is a 66 year old female with history of hypertension, hyperlipidemia, and breast cancer.  She recently completed radiation.  Yesterday she was given a bisphosphonate injection for preservation of bone mass.  Several hours later, she was having dinner when she developed the acute onset of nausea, chills, and fever.  She describes fever at home to 100.7.  She called her oncologist and was instructed to come to the ER to be evaluated.  Patient denies any other symptoms that would explain the fever.  She denies any cough, vomiting/diarrhea, sore throat.  She denies any ill contacts. ? ?The history is provided by the patient.  ? ?  ? ?Home Medications ?Prior to Admission medications   ?Medication Sig Start Date End Date Taking? Authorizing Provider  ?albuterol (VENTOLIN HFA) 108 (90 Base) MCG/ACT inhaler Inhale 2 puffs into the lungs every 6 (six) hours as needed for wheezing or shortness of breath. 08/09/21   Fransico Meadow, PA-C  ?amLODipine (NORVASC) 5 MG tablet Take 1 tablet (5 mg total) by mouth daily. 01/09/22   Burchette, Alinda Sierras, MD  ?Ascorbic Acid (VITAMIN C) 100 MG CHEW     [provider]  ?Aspirin (VAZALORE) 81 MG CAPS 1 tablet    [provider]  ?calcium carbonate (OS-CAL - DOSED IN MG OF ELEMENTAL CALCIUM) 1250 (500 Ca) MG tablet Take 1 tablet by mouth.    [provider]  ?Fiber CHEW Chew 2 tablets by mouth every morning.     [provider]  ?fluticasone (FLONASE) 50 MCG/ACT nasal spray Place 1 spray into both nostrils daily as needed for allergies.  10/22/16   [provider]  ?losartan (COZAAR) 100 MG tablet Take 1 tablet (100 mg total) by mouth daily. 12/08/21   Burchette, Alinda Sierras, MD  ?Multiple Vitamin  (MULTIVITAMIN) tablet Take 1 tablet by mouth daily.    [provider]  ?tamoxifen (NOLVADEX) 20 MG tablet Take 1 tablet (20 mg total) by mouth daily. 01/19/22   Nicholas Lose, MD  ?valACYclovir (VALTREX) 1000 MG tablet Take two tablets by mouth at onset of cold sore and two tablets 12 hours later. 08/15/21   Burchette, Alinda Sierras, MD  ?vitamin E 400 UNIT capsule Take 400 Units by mouth daily.    [provider]  ?olmesartan (BENICAR) 20 MG tablet Take 20 mg by mouth daily.    11/30/11  [provider]  ?   ? ?Allergies    ?Other   ? ?Review of Systems   ?Review of Systems  ?All other systems reviewed and are negative. ? ?Physical Exam ?Updated Vital Signs ?BP (!) 142/79   Pulse 100   Temp 99.8 ?F (37.7 ?C) (Oral)   Resp 16   SpO2 95%  ?Physical Exam ?Vitals and nursing note reviewed.  ?Constitutional:   ?   General: She is not in acute distress. ?   Appearance: She is well-developed. She is not diaphoretic.  ?HENT:  ?   Head: Normocephalic and atraumatic.  ?   Mouth/Throat:  ?   Mouth: Mucous membranes are moist.  ?   Pharynx: No oropharyngeal exudate or posterior oropharyngeal erythema.  ?Cardiovascular:  ?   Rate and Rhythm: Normal rate and regular rhythm.  ?  Heart sounds: No murmur heard. ?  No friction rub. No gallop.  ?Pulmonary:  ?   Effort: Pulmonary effort is normal. No respiratory distress.  ?   Breath sounds: Normal breath sounds. No wheezing.  ?Abdominal:  ?   General: Bowel sounds are normal. There is no distension.  ?   Palpations: Abdomen is soft.  ?   Tenderness: There is no abdominal tenderness.  ?Musculoskeletal:     ?   General: Normal range of motion.  ?   Cervical back: Normal range of motion and neck supple.  ?Skin: ?   General: Skin is warm and dry.  ?Neurological:  ?   General: No focal deficit present.  ?   Mental Status: She is alert and oriented to person, place, and time.  ? ? ?ED Results / Procedures / Treatments   ?Labs ?(all labs ordered are listed, but only  abnormal results are displayed) ?Labs Reviewed  ?CULTURE, BLOOD (ROUTINE X 2)  ?CULTURE, BLOOD (ROUTINE X 2)  ?URINE CULTURE  ?RESP PANEL BY RT-PCR (FLU A&B, COVID) ARPGX2  ?COMPREHENSIVE METABOLIC PANEL  ?CBC WITH DIFFERENTIAL/PLATELET  ?URINALYSIS, ROUTINE W REFLEX MICROSCOPIC  ?LACTIC ACID, PLASMA  ?LACTIC ACID, PLASMA  ? ? ?EKG ?None ? ?Radiology ?No results found. ? ?Procedures ?Procedures  ? ? ?Medications Ordered in ED ?Medications  ?sodium chloride 0.9 % bolus 1,000 mL (has no administration in time range)  ? ? ?ED Course/ Medical Decision Making/ A&P ? ?This patient presents to the ED for concern of fever and chills, this involves an extensive number of treatment options, and is a complaint that carries with it a high risk of complications and morbidity.  The differential diagnosis includes infection, either viral or bacterial, medication side effect ? ? ?Co morbidities that complicate the patient evaluation ? ?Breast cancer with recent radiation therapy ? ? ?Additional history obtained: ? ?No additional history or external records needed ? ? ?Lab Tests: ? ?I Ordered, and personally interpreted labs.  The pertinent results include: Unremarkable CBC, metabolic panel, lactate.  Blood cultures pending. ? ? ?Imaging Studies ordered: ? ?I ordered imaging studies including chest x-ray ?I independently visualized and interpreted imaging which showed no acute process ?I agree with the radiologist interpretation ? ? ?Cardiac Monitoring: / EKG: ? ?The patient was maintained on a cardiac monitor.  I personally viewed and interpreted the cardiac monitored which showed an underlying rhythm of: Sinus ? ? ?Consultations Obtained: ? ?No consultations obtained ? ? ?Problem List / ED Course / Critical interventions / Medication management ? ?Patient presenting here with complaints of fever and chills that started suddenly this evening.  Earlier today, she received an infusion of a bisphosphonate medication and I suspect that  this was likely a side effect of this medicine.  Also considered was infection, whether viral or bacterial, but none was identified with her COVID swab, chest x-ray, or urinalysis.  At this point, patient seems to be feeling better and I believe can safely be discharged.  She was ambulatory to the bathroom with stable vital signs and no hypoxia.  Patient to return as needed if symptoms worsen or change.  Blood cultures are pending and patient will be notified if these turn positive. ?No medications ordered were given ?I have reviewed the patients home medicines and have made adjustments as needed ? ? ?Social Determinants of Health: ? ?None ? ? ?Test / Admission - Considered: ? ?Patient to be discharged to home with as needed return. ? ? ?Final  Clinical Impression(s) / ED Diagnoses ?Final diagnoses:  ?None  ? ? ?Rx / DC Orders ?ED Discharge Orders   ? ? None  ? ?  ? ? ?  ?Veryl Speak, MD ?01/22/22 0404 ? ?

## 2022-01-22 NOTE — ED Triage Notes (Signed)
Pt reports with fever, chills, and nausea since 10 pm last night. Pt reports having an infusion yesterday morning at 0930 and feels that she is having a reaction.  ?

## 2022-01-23 ENCOUNTER — Other Ambulatory Visit: Payer: Self-pay | Admitting: Hematology and Oncology

## 2022-01-23 ENCOUNTER — Telehealth: Payer: Self-pay | Admitting: Hematology and Oncology

## 2022-01-23 LAB — URINE CULTURE: Culture: <10000 — AB

## 2022-01-23 NOTE — Telephone Encounter (Signed)
Reaction to Zometa required ED with fevers and chills. ?Joints and muscles are hurting. ?Felt like being hit by [employer] truck ?D/C Zometa. ?For next year, we will use Prolia instead. ? ?

## 2022-01-26 DIAGNOSIS — S92114A Nondisplaced fracture of neck of right talus, initial encounter for closed fracture: Secondary | ICD-10-CM | POA: Diagnosis not present

## 2022-01-27 LAB — CULTURE, BLOOD (ROUTINE X 2)
Culture: NO GROWTH
Culture: NO GROWTH
Special Requests: ADEQUATE
Special Requests: ADEQUATE

## 2022-02-06 ENCOUNTER — Encounter: Payer: Self-pay | Admitting: Radiation Oncology

## 2022-02-13 DIAGNOSIS — S92114A Nondisplaced fracture of neck of right talus, initial encounter for closed fracture: Secondary | ICD-10-CM | POA: Diagnosis not present

## 2022-02-15 NOTE — Progress Notes (Signed)
Radiation Oncology         [phone] ________________________________  Name: Laura [name] MRN: [id number]  Date: 02/16/2022  DOB: 06-18-1956  Follow-Up Visit Note  CC: Eulas Post, MD  Rolm Bookbinder, MD  No diagnosis found.  Diagnosis: The encounter diagnosis was Lobular carcinoma of left breast ([facility]).   Stage IA (T1a, pNx) Left Breast, Invasive pleomorphic lobular carcinoma, ER+ / PR+ / Her2-, Grade 2  Interval Since Last Radiation: 1 month and 3 days   Intent: Curative  Radiation Treatment Dates: 12/16/2021 through 01/13/2022 Site Technique Total Dose (Gy) Dose per Fx (Gy) Completed Fx Beam Energies  Breast, Left: Breast_L 3D 40.05/40.05 2.67 15/15 10X  Breast, Left: Breast_L_Bst 3D 12/12 2 6/6 6X, 10X    Narrative:  The patient returns today for routine follow-up. The patient tolerated radiation therapy relatively well. During her final weekly treatment check on 01/12/22, the patient reported mild tenderness to the left breast, fatigue, and a mild red and itchy rash. 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 Some hyperpigmentation changes were also noted. No skin breakdown was appreciated.        Since completing XRT, the patient followed up with Dr. Lindi Adie on 01/13/22. During which time, the patient reported feeling well other than some persistent radiation dermatitis. The patient also began antiestrogen treatment consisting of tamoxifen during this visit. Given her osteoporosis, the patient was started on Prolia on 05/10 along with calcium and vitamin D.   Following receiving prolia on 01/21/22, the patient developed acute onset of nausea, chills, and fever. Following onset of symptoms, the patient called Dr. Lindi Adie who instructed her to present to the ED for evaluation. ED workup including CXR, labs and urinalysis were all negative, and the patient was monitored until symptoms subsided. Overall,  her symptoms were likely due to side effects from prolia and she was discharged home that same date without hospital intervention.       ***                   Allergies:  is allergic to other.  Meds: Current Outpatient Medications  Medication Sig Dispense Refill   albuterol (VENTOLIN HFA) 108 (90 Base) MCG/ACT inhaler Inhale 2 puffs into the lungs every 6 (six) hours as needed for wheezing or shortness of breath. (Patient not taking: Reported on 01/22/2022) 8 g 2   amLODipine (NORVASC) 5 MG tablet Take 1 tablet (5 mg total) by mouth daily. 90 tablet 3   Aspirin (VAZALORE) 81 MG CAPS Take 81 mg by mouth daily.     calcium carbonate (OS-CAL - DOSED IN MG OF ELEMENTAL CALCIUM) 1250 (500 Ca) MG tablet Take 1 tablet by mouth.     losartan (COZAAR) 100 MG tablet Take 1 tablet (100 mg total) by mouth daily. 90 tablet 3   Multiple Vitamin (MULTIVITAMIN) tablet Take 1 tablet by mouth daily.     tamoxifen (NOLVADEX) 20 MG tablet Take 1 tablet (20 mg total) by mouth daily. (Patient not taking: Reported on 01/22/2022) 90 tablet 1   vitamin E 400 UNIT capsule Take 400 Units by mouth daily.     No current facility-administered medications for this encounter.    Physical Findings: The patient is in no acute distress. Patient is alert and oriented.  vitals were not taken for this visit. .  No significant changes. Lungs are clear to auscultation bilaterally. Heart has regular rate and rhythm. No  palpable cervical, supraclavicular, or axillary adenopathy. Abdomen soft, non-tender, normal bowel sounds.  Left Breast: no palpable mass, nipple discharge or bleeding. ***    Lab Findings: Lab Results  Component Value Date   WBC 10.0 01/22/2022   HGB 13.4 01/22/2022   HCT 41.0 01/22/2022   MCV 91.9 01/22/2022   PLT 250 01/22/2022    Radiographic Findings: DG Chest Port 1 View  Result Date: 01/22/2022 CLINICAL DATA:  Fever, chills EXAM: PORTABLE CHEST 1 VIEW COMPARISON:  03/23/2011 FINDINGS: Heart and  mediastinal contours are within normal limits. No focal opacities or effusions. No acute bony abnormality. IMPRESSION: No active disease. Electronically Signed   By: Rolm Baptise M.D.   On: 01/22/2022 03:11    Impression:  Stage IA (T1a, pNx) Left Breast, Invasive pleomorphic lobular carcinoma, ER+ / PR+ / Her2-, Grade 2  The patient is recovering from the effects of radiation.  ***  Plan:  ***   *** minutes of total time was spent for this patient encounter, including preparation, face-to-face counseling with the patient and coordination of care, physical exam, and documentation of the encounter. ____________________________________  Blair Promise, PhD, MD  This document serves as a record of services personally performed by Gery Pray, MD. It was created on his behalf by Roney Mans, a trained medical scribe. The creation of this record is based on the scribe's personal observations and the provider's statements to them. This document has been checked and approved by the attending provider.

## 2022-02-15 NOTE — Progress Notes (Incomplete)
  Radiation Oncology         [phone] ________________________________  Patient Name: Laura [name] MRN: [id number] DOB: 1956-05-22 Referring Physician: Donne Hazel [name] (Profile Not Attached) Date of Service: 01/13/2022 [facility], [place]                                                        End Of Treatment Note  Diagnoses: C50.912-Malignant neoplasm of unspecified site of left female breast  Cancer Staging: The encounter diagnosis was Lobular carcinoma of left breast ([name]).  Stage IA (T1a, pNx) Left Breast, Invasive pleomorphic lobular carcinoma, ER+ / PR+ / Her2-, Grade 2  Intent: Curative  Radiation Treatment Dates: 12/16/2021 through 01/13/2022 Site Technique Total Dose (Gy) Dose per Fx (Gy) Completed Fx Beam Energies  Breast, Left: Breast_L 3D 40.05/40.05 2.67 15/15 10X  Breast, Left: Breast_L_Bst 3D 12/12 2 6/6 6X, 10X   Narrative: The patient tolerated radiation therapy relatively well. During her final weekly treatment check on 01/12/22, the patient reported mild tenderness to the left breast, fatigue, and a mild red and itchy rash. 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 Some hyperpigmentation changes were also noted. No skin breakdown was appreciated.   Plan: The patient will follow-up with radiation oncology in one month .  ________________________________________________ -----------------------------------  Blair Promise, PhD, MD  This document serves as a record of services personally performed by Gery Pray, MD. It was created on his behalf by Roney Mans, a trained medical scribe. The creation of this record is based on the scribe's personal observations and the provider's statements to them. This document has been checked and approved by the attending provider.

## 2022-02-16 ENCOUNTER — Encounter: Payer: Self-pay | Admitting: Radiation Oncology

## 2022-02-16 ENCOUNTER — Ambulatory Visit
Admission: RE | Admit: 2022-02-16 | Discharge: 2022-02-16 | Disposition: A | Discharge status: Home / Self Care | Payer: Medicare HMO | Source: Ambulatory Visit | Attending: Radiation Oncology | Admitting: Radiation Oncology

## 2022-02-16 ENCOUNTER — Other Ambulatory Visit: Payer: Self-pay

## 2022-02-16 DIAGNOSIS — Z7982 Long term (current) use of aspirin: Secondary | ICD-10-CM | POA: Insufficient documentation

## 2022-02-16 DIAGNOSIS — Z79810 Long term (current) use of selective estrogen receptor modulators (SERMs): Secondary | ICD-10-CM | POA: Insufficient documentation

## 2022-02-16 DIAGNOSIS — C50412 Malignant neoplasm of upper-outer quadrant of left female breast: Secondary | ICD-10-CM | POA: Insufficient documentation

## 2022-02-16 DIAGNOSIS — Z79899 Other long term (current) drug therapy: Secondary | ICD-10-CM | POA: Diagnosis not present

## 2022-02-16 DIAGNOSIS — Z170 Estrogen receptor positive status [ER+]: Secondary | ICD-10-CM | POA: Diagnosis not present

## 2022-02-16 DIAGNOSIS — Z923 Personal history of irradiation: Secondary | ICD-10-CM | POA: Insufficient documentation

## 2022-02-16 HISTORY — DX: Personal history of irradiation: Z92.3

## 2022-02-16 NOTE — Progress Notes (Signed)
Laura [name] is here today for follow up post radiation to the breast.   Breast Side:Left   They completed their radiation on: 01/13/22  Does the patient complain of any of the following: Post radiation skin issues: Patient reports skin to left breast remains intact, hyperpigmented Breast Tenderness: Yes Breast Swelling: No Lymphadema: No Range of Motion limitations: No, patient demonstrates full range of motion.  Fatigue post radiation: Patient reports improvement in energy level.  Appetite good/fair/poor: Good   Additional comments if applicable:   BP (!) 518/33 (BP Location: Right Arm, Patient Position: Sitting, Cuff Size: Normal)   Pulse 72   Temp (!) 96.8 F (36 C)   Resp 20   Ht '5\' 7"'$  (1.702 m)   Wt 179 lb 12.8 oz (81.6 kg)   SpO2 100%   BMI 28.16 kg/m

## 2022-02-20 ENCOUNTER — Ambulatory Visit (INDEPENDENT_AMBULATORY_CARE_PROVIDER_SITE_OTHER): Payer: Medicare HMO | Admitting: Family Medicine

## 2022-02-20 ENCOUNTER — Encounter: Payer: Self-pay | Admitting: Family Medicine

## 2022-02-20 VITALS — BP 138/62 | HR 75 | Temp 97.1°F | Ht 67.0 in | Wt 176.7 lb

## 2022-02-20 DIAGNOSIS — I10 Essential (primary) hypertension: Secondary | ICD-10-CM | POA: Diagnosis not present

## 2022-02-20 NOTE — Progress Notes (Signed)
Established Patient Office Visit  Subjective   Patient ID: Laura [name], female    DOB: 06-Jan-1956  Age: 66 y.o. MRN: [id number]  Chief Complaint  Patient presents with   Follow-up    HPI   Follow-up hypertension.  We recently added amlodipine to her losartan after several readings up around 426S systolic.  She is tolerating well with no side effects.  No peripheral edema.  No headaches.  She unfortunately did have an adverse reaction to Zometa injection and that prompted ER visit.  Laura [name] has gained some weight this year partly due to ankle injury from fracture and she hopes to pick up her exercise soon.  She is trying to eat better.  Past Medical History:  Diagnosis Date   ABNORMAL ELECTROCARDIOGRAM 08/05/2010   02-19-15 EKG shows NSR. Pt voices no issues with heart related problems.   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   GERD 01/25/2008   uses Prevacid as needed   History of radiation therapy    Left Breast- 12/16/21-01/13/22- Dr. Gery Pray   HYPERGLYCEMIA    HYPERTENSION 08/05/2010   Impaired glucose tolerance 01/11/2011   METATARSALGIA 11/08/2007   work related- no recent issues"wearing steel toe shoes at the time"   PONV (postoperative nausea and vomiting)    Previous back surgery 10/26/17, 05/26/18   RECTAL BLEEDING 01/04/2008   06-18-15 no problems now.   Tendonitis    right wrist-has soft splint in place.   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   VENTRICULAR HYPERTROPHY, LEFT 09/23/2010   Wears glasses     Past Surgical History:  Procedure Laterality Date   ABDOMINAL HYSTERECTOMY  2002   TAH-LTSO-LOA   ARTHROSCOPIC REPAIR ACL Right 10/1998   right knee    BACK SURGERY  2019   BREAST EXCISIONAL BIOPSY Left 2015   benign   BREAST SURGERY Left    lumpectomy-benign 2015   CARDIOVASCULAR STRESS TEST  2011   CARPAL TUNNEL RELEASE  2014   left   CHOLECYSTECTOMY  1985   open   COLONOSCOPY     COLONOSCOPY W/ POLYPECTOMY     DILATION AND CURETTAGE OF UTERUS     ELBOW ARTHROPLASTY  2014   left   ESOPHAGOGASTRODUODENOSCOPY  04/01/1998   KNEE ARTHROSCOPY Bilateral    meniscus tear-left.   NASAL SINUS SURGERY     s/p   ORIF ELBOW FRACTURE  12/23/2011   Procedure: OPEN REDUCTION INTERNAL FIXATION (ORIF) ELBOW/OLECRANON FRACTURE;  Surgeon: Linna Hoff, MD;  Location: [facility];  Service: Orthopedics;  Laterality: Left;  LEFT ELBOW ORIF OF PROXIMAL OLECRANON AND RADIAL HEAD, POSSIBLE ARTHROPLASTY   OVARIAN CYST REMOVAL     RADIOACTIVE SEED GUIDED EXCISIONAL BREAST BIOPSY Left 11/04/2021   Procedure: RADIOACTIVE SEED GUIDED EXCISIONAL LEFT BREAST BIOPSY;  Surgeon: Rolm Bookbinder, MD;  Location: [facility];  Service: General;  Laterality: Left;   TOTAL KNEE ARTHROPLASTY Right 06/24/2015   Procedure: RIGHT TOTAL KNEE ARTHROPLASTY;  Surgeon: Gaynelle Arabian, MD;  Location: [facility];  Service: Orthopedics;  Laterality: Right;   TRANSFORAMINAL LUMBAR INTERBODY FUSION (TLIF) WITH PEDICLE SCREW FIXATION 1 LEVEL Left 03/03/2019   Procedure: Left Lumbar four-five Redo Microdiscectomy with transforaminal lumbar interbody fusion;  Surgeon: Erline Levine, MD;  Location: [facility];  Service: Neurosurgery;  Laterality: Left;   UPPER GASTROINTESTINAL ENDOSCOPY     US ECHOCARDIOGRAPHY  2011    reports that she has never smoked. She has never used smokeless tobacco. She reports that she does not drink alcohol and does not use drugs. family history includes Cancer in an other family member; Diabetes in  her mother; Heart disease (age of onset: 43) in her father; Thyroid disease in her mother. Allergies  Allergen Reactions   Other Rash    dermabond    Review of Systems  Respiratory:  Negative for shortness of breath.   Cardiovascular:  Negative for chest pain.  Neurological:  Negative for dizziness and headaches.      Objective:     BP 138/62 (BP Location: Right Arm, Cuff Size: Normal)   Pulse 75   Temp (!) 97.1 F (36.2 C) (Oral)   Ht '5\' 7"'$  (1.702 m)   Wt 176 lb 11.2 oz (80.2 kg)   SpO2 99%   BMI 27.68 kg/m    Physical Exam Vitals reviewed.  Constitutional:      Appearance: Normal appearance.  Cardiovascular:     Rate and Rhythm: Normal rate and regular rhythm.  Musculoskeletal:     Right lower leg: No edema.     Left lower leg: No edema.  Neurological:     Mental Status: She is alert.      No results found for any visits on 02/20/22.    The 10-year ASCVD risk score (Arnett DK, et al., 2019) is: 8.2%    Assessment & Plan:   Problem List Items Addressed This Visit       Unprioritized   Essential hypertension - Primary  Hypertension improved with recent addition of amlodipine to her losartan.  Continue current regimen.  We have encouraged her to try to lose some weight and hopefully she can pick up her exercise soon.  Set up routine follow-up in about 6 months.  Return in about 6 months (around 08/22/2022).    Carolann Littler, MD

## 2022-02-20 NOTE — Patient Instructions (Signed)
Blood pressure is improved.  Continue with combination therapy with Losartan and Amlodipine  Monitor blood pressure and be in touch if consistently > 140/90.     Try to lose some weight, as discussed.

## 2022-03-16 DIAGNOSIS — S92114A Nondisplaced fracture of neck of right talus, initial encounter for closed fracture: Secondary | ICD-10-CM | POA: Diagnosis not present

## 2022-04-01 DIAGNOSIS — H10413 Chronic giant papillary conjunctivitis, bilateral: Secondary | ICD-10-CM | POA: Diagnosis not present

## 2022-04-01 DIAGNOSIS — H04123 Dry eye syndrome of bilateral lacrimal glands: Secondary | ICD-10-CM | POA: Diagnosis not present

## 2022-04-08 DIAGNOSIS — M19072 Primary osteoarthritis, left ankle and foot: Secondary | ICD-10-CM | POA: Diagnosis not present

## 2022-04-08 DIAGNOSIS — M25572 Pain in left ankle and joints of left foot: Secondary | ICD-10-CM | POA: Diagnosis not present

## 2022-04-14 ENCOUNTER — Encounter: Payer: Self-pay | Admitting: *Deleted

## 2022-04-15 ENCOUNTER — Other Ambulatory Visit: Payer: Self-pay

## 2022-04-15 ENCOUNTER — Inpatient Hospital Stay: Payer: Medicare HMO | Attending: Hematology and Oncology | Admitting: Adult Health

## 2022-04-15 ENCOUNTER — Encounter: Payer: Self-pay | Admitting: Adult Health

## 2022-04-15 VITALS — BP 166/75 | HR 87 | Temp 97.7°F | Resp 18 | Ht 67.0 in | Wt 177.1 lb

## 2022-04-15 DIAGNOSIS — Z7982 Long term (current) use of aspirin: Secondary | ICD-10-CM | POA: Insufficient documentation

## 2022-04-15 DIAGNOSIS — C50412 Malignant neoplasm of upper-outer quadrant of left female breast: Secondary | ICD-10-CM

## 2022-04-15 DIAGNOSIS — C50912 Malignant neoplasm of unspecified site of left female breast: Secondary | ICD-10-CM | POA: Insufficient documentation

## 2022-04-15 DIAGNOSIS — Z923 Personal history of irradiation: Secondary | ICD-10-CM | POA: Diagnosis not present

## 2022-04-15 DIAGNOSIS — Z79899 Other long term (current) drug therapy: Secondary | ICD-10-CM | POA: Diagnosis not present

## 2022-04-15 DIAGNOSIS — M810 Age-related osteoporosis without current pathological fracture: Secondary | ICD-10-CM | POA: Insufficient documentation

## 2022-04-15 DIAGNOSIS — Z79810 Long term (current) use of selective estrogen receptor modulators (SERMs): Secondary | ICD-10-CM | POA: Diagnosis not present

## 2022-04-15 DIAGNOSIS — Z170 Estrogen receptor positive status [ER+]: Secondary | ICD-10-CM | POA: Insufficient documentation

## 2022-04-15 MED ORDER — KETOCONAZOLE 2 % EX CREA: 1.0000 | TOPICAL_CREAM | Freq: Every day | CUTANEOUS | 0 refills | Status: DC

## 2022-04-15 NOTE — Progress Notes (Signed)
SURVIVORSHIP VISIT:   BRIEF ONCOLOGIC HISTORY:  Oncology History  Malignant neoplasm of upper-outer quadrant of left breast in female, estrogen receptor positive ([place])  08/20/2021 Initial Diagnosis   Screening mammogram detected possible distortion in the left breast: Biopsy: Atypical lobular hyperplasia   11/04/2021 Surgery   Left lumpectomy: Grade 2 invasive lobular cancer pleomorphic features, 5 mm, margins negative, ER 100%, PR 95%, Ki-67 2%, HER2 2+ by IHC, FISH negative, ratio 1.31   12/16/2021 - 01/13/2022 Radiation Therapy   Site Technique Total Dose (Gy) Dose per Fx (Gy) Completed Fx Beam Energies  Breast, Left: Breast_L 3D 40.05/40.05 2.67 15/15 10X  Breast, Left: Breast_L_Bst 3D 12/12 2 6/6 6X, 10X     01/2022 -  Anti-estrogen oral therapy   Tamoxifen     INTERVAL HISTORY:  Laura [name] to review her survivorship care plan detailing her treatment course for breast cancer, as well as monitoring long-term side effects of that treatment, education regarding health maintenance, screening, and overall wellness and health promotion.     Overall, Laura [name] reports feeling moderately well .  She is taking tamoxifen daily the main issue has been hot flashes and occasional leg cramps which she says are tolerable.  REVIEW OF SYSTEMS:  Review of Systems  Constitutional:  Negative for appetite change, chills, fatigue, fever and unexpected weight change.  HENT:   Negative for hearing loss, lump/mass and trouble swallowing.   Eyes:  Negative for eye problems and icterus.  Respiratory:  Negative for chest tightness, cough and shortness of breath.   Cardiovascular:  Negative for chest pain, leg swelling and palpitations.  Gastrointestinal:  Negative for abdominal distention, abdominal pain, constipation, diarrhea, nausea and vomiting.  Endocrine: Positive for hot flashes.  Genitourinary:  Negative for difficulty urinating.   Musculoskeletal:  Negative for arthralgias.  Skin:  Negative for  itching and rash.  Neurological:  Negative for dizziness, extremity weakness, headaches and numbness.  Hematological:  Negative for adenopathy. Does not bruise/bleed easily.  Psychiatric/Behavioral:  Negative for depression. The patient is not nervous/anxious.   Breast: Denies any new nodularity, masses, tenderness, nipple changes, or nipple discharge.      ONCOLOGY TREATMENT TEAM:  1. Surgeon:  Dr. Donne Hazel at [facility] 2. Medical Oncologist: Dr. Lindi Adie  3. Radiation Oncologist: Dr. Sondra Come    PAST MEDICAL/SURGICAL HISTORY:  Past Medical History:  Diagnosis Date   ABNORMAL ELECTROCARDIOGRAM 08/05/2010   02-19-15 EKG shows NSR. Pt voices no issues with heart related problems.   ACNE, MILD 02/17/2008   not a problem so much 06-18-15   ALLERGIC RHINITIS 01/25/2008   Allergy    ASTHMA 01/25/2008   Asthma    Atypical lobular hyperplasia [facility]) of left breast 09/2021   Cavus deformity of foot, 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   CONSTIPATION 02/21/2008   uses fiber to control   ESOPHAGEAL STRICTURE 02/17/2008   dilation done - no problems since.   GERD 01/25/2008   uses Prevacid as needed   History of radiation therapy    Left Breast- 12/16/21-01/13/22- Dr. Gery Pray   HYPERGLYCEMIA    HYPERTENSION 08/05/2010   Impaired glucose tolerance 01/11/2011   METATARSALGIA 11/08/2007   work related- no recent issues"wearing steel toe shoes at the time"   PONV (postoperative nausea and vomiting)    Previous back surgery 10/26/17, 05/26/18   RECTAL BLEEDING 01/04/2008   06-18-15 no problems now.   Tendonitis    right wrist-has soft splint in place.   Tibialis tendinitis 05/13/2010   Unspecified disorder  of liver 01/19/2008   evaluated and thought to be related to pain med use at current time- no further issues noted.   VENTRICULAR HYPERTROPHY, LEFT 09/23/2010   Wears glasses    Past Surgical History:  Procedure Laterality Date   ABDOMINAL HYSTERECTOMY  2002   TAH-LTSO-LOA    ARTHROSCOPIC REPAIR ACL Right 10/1998   right knee    BACK SURGERY  2019   BREAST EXCISIONAL BIOPSY Left 2015   benign   BREAST SURGERY Left    lumpectomy-benign 2015   CARDIOVASCULAR STRESS TEST  2011   CARPAL TUNNEL RELEASE  2014   left   CHOLECYSTECTOMY  1985   open   COLONOSCOPY     COLONOSCOPY W/ POLYPECTOMY     DILATION AND CURETTAGE OF UTERUS     ELBOW ARTHROPLASTY  2014   left   ESOPHAGOGASTRODUODENOSCOPY  04/01/1998   KNEE ARTHROSCOPY Bilateral    meniscus tear-left.   NASAL SINUS SURGERY     s/p   ORIF ELBOW FRACTURE  12/23/2011   Procedure: OPEN REDUCTION INTERNAL FIXATION (ORIF) ELBOW/OLECRANON FRACTURE;  Surgeon: Linna Hoff, MD;  Location: [place];  Service: Orthopedics;  Laterality: Left;  LEFT ELBOW ORIF OF PROXIMAL OLECRANON AND RADIAL HEAD, POSSIBLE ARTHROPLASTY   OVARIAN CYST REMOVAL     RADIOACTIVE SEED GUIDED EXCISIONAL BREAST BIOPSY Left 11/04/2021   Procedure: RADIOACTIVE SEED GUIDED EXCISIONAL LEFT BREAST BIOPSY;  Surgeon: Rolm Bookbinder, MD;  Location: [place];  Service: General;  Laterality: Left;   TOTAL KNEE ARTHROPLASTY Right 06/24/2015   Procedure: RIGHT TOTAL KNEE ARTHROPLASTY;  Surgeon: Gaynelle Arabian, MD;  Location[facility];  Service: Orthopedics;  Laterality: Right;   TRANSFORAMINAL LUMBAR INTERBODY FUSION (TLIF) WITH PEDICLE SCREW FIXATION 1 LEVEL Left 03/03/2019   Procedure: Left Lumbar four-five Redo Microdiscectomy with transforaminal lumbar interbody fusion;  Surgeon: Erline Levine, MD;  Location: [place];  Service: Neurosurgery;  Laterality: Left;   UPPER GASTROINTESTINAL ENDOSCOPY     US ECHOCARDIOGRAPHY  2011     ALLERGIES:  Allergies  Allergen Reactions   Other Rash    dermabond     CURRENT MEDICATIONS:  Outpatient Encounter Medications as of 04/15/2022  Medication Sig   albuterol (VENTOLIN HFA) 108 (90 Base) MCG/ACT inhaler Inhale 2 puffs into the lungs every 6 (six) hours as needed for wheezing or shortness of  breath.   amLODipine (NORVASC) 5 MG tablet Take 1 tablet (5 mg total) by mouth daily.   Aspirin (VAZALORE) 81 MG CAPS Take 81 mg by mouth daily.   calcium carbonate (OS-CAL - DOSED IN MG OF ELEMENTAL CALCIUM) 1250 (500 Ca) MG tablet Take 1 tablet by mouth.   losartan (COZAAR) 100 MG tablet Take 1 tablet (100 mg total) by mouth daily.   Multiple Vitamin (MULTIVITAMIN) tablet Take 1 tablet by mouth daily.   tamoxifen (NOLVADEX) 20 MG tablet Take 1 tablet (20 mg total) by mouth daily.   vitamin E 400 UNIT capsule Take 400 Units by mouth daily.   [DISCONTINUED] olmesartan (BENICAR) 20 MG tablet Take 20 mg by mouth daily.     No facility-administered encounter medications on file as of 04/15/2022.     ONCOLOGIC FAMILY HISTORY:  Family History  Problem Relation Age of Onset   Diabetes Mother    Thyroid disease Mother        hypothyroidism   Cancer Other        Pancreatic Cancer-Aunt   Heart disease Father 25  cad   Colon cancer Neg Hx    Colon polyps Neg Hx    Esophageal cancer Neg Hx    Stomach cancer Neg Hx    Rectal cancer Neg Hx      SOCIAL HISTORY:  Social History   Socioeconomic History   Marital status: Married    Spouse name: Not on file   Number of children: Not on file   Years of education: Not on file   Highest education level: Not on file  Occupational History   Not on file  Tobacco Use   Smoking status: Never   Smokeless tobacco: Never  Vaping Use   Vaping Use: Never used  Substance and Sexual Activity   Alcohol use: No   Drug use: No   Sexual activity: Yes    Birth control/protection: Post-menopausal  Other Topics Concern   Not on file  Social History Narrative   Pt does not get regular exercise.   Daily Caffeine Use (12 oz Coke Zero per day)   Social Determinants of Health   Financial Resource Strain: Not on file  Food Insecurity: Not on file  Transportation Needs: Not on file  Physical Activity: Not on file  Stress: Not on file  Social  Connections: Not on file  Intimate Partner Violence: Not on file     OBSERVATIONS/OBJECTIVE:  BP (!) 166/75 (BP Location: Left Arm, Patient Position: Sitting)   Pulse 87   Temp 97.7 F (36.5 C) (Temporal)   Resp 18   Ht _0  (1.702 m)   Wt 177 lb 1.6 oz (80.3 kg)   SpO2 100%   BMI 27.74 kg/m  GENERAL: Patient is a well appearing female in no acute distress HEENT:  Sclerae anicteric.  Oropharynx clear and moist. No ulcerations or evidence of oropharyngeal candidiasis. Neck is supple.  NODES:  No cervical, supraclavicular, or axillary lymphadenopathy palpated.  BREAST EXAM: Left breast status postlumpectomy and radiation no sign of local recurrence right breast is benign slight fungal rash underneath right breast LUNGS:  Clear to auscultation bilaterally.  No wheezes or rhonchi. HEART:  Regular rate and rhythm. No murmur appreciated. ABDOMEN:  Soft, nontender.  Positive, normoactive bowel sounds. No organomegaly palpated. MSK:  No focal spinal tenderness to palpation. Full range of motion bilaterally in the upper extremities. EXTREMITIES:  No peripheral edema.   SKIN:  Clear with no obvious rashes or skin changes. No nail dyscrasia. NEURO:  Nonfocal. Well oriented.  Appropriate affect.   LABORATORY DATA:  None for this visit.  DIAGNOSTIC IMAGING:  None for this visit.      ASSESSMENT AND PLAN:  Ms.. [name] is a pleasant 66 y.o. female with Stage IA left breast invasive lobular carcinoma, ER+/PR+/HER2-, diagnosed in 08/2021, treated with lumpectomy, adjuvant radiation therapy, and anti-estrogen therapy with Tamoxifen beginning in 01/2022.  She presents to the Survivorship Clinic for our initial meeting and routine follow-up post-completion of treatment for breast cancer.    1. Stage IA left breast cancer:  Laura [name] is continuing to recover from definitive treatment for breast cancer. She will follow-up with her medical oncologist, Dr. Lindi Adie in 07/2022 with history and  physical exam per surveillance protocol.  She will continue her anti-estrogen therapy with tamoxifen. Thus far, she is tolerating the tamoxifen well, with minimal side effects. She was instructed to make Dr. Lindi Adie or myself aware if she begins to experience any worsening side effects of the medication and I could see her back in clinic to help manage  those side effects, as needed. Her mammogram is due November 2023; orders placed today.  We discussed the fact that she could undergo genetic testing considering her family history and she would like to be referred to her counselors to discuss this further.  I placed this order today.  Today, a comprehensive survivorship care plan and treatment summary was reviewed with the patient today detailing her breast cancer diagnosis, treatment course, potential late/long-term effects of treatment, appropriate follow-up care with recommendations for the future, and patient education resources.  A copy of this summary, along with a letter will be sent to the patient's primary care provider via mail/fax/In Basket message after today's visit.    2.  Breast rash: I placed orders for ketoconazole cream into her pharmacy.  If this rash does not resolve she was instructed to let me know.  3. Bone health:   She was given education on specific activities to promote bone health.  4. Cancer screening:  Due to Laura [name]'s history and her age, she should receive screening for skin cancers, colon cancer, and gynecologic cancers.  The information and recommendations are listed on the patient's comprehensive care plan/treatment summary and were reviewed in detail with the patient.    5. Health maintenance and wellness promotion: Laura [name] was encouraged to consume 5-7 servings of fruits and vegetables per day. We reviewed the "Nutrition Rainbow" handout.  She was also encouraged to engage in moderate to vigorous exercise for 30 minutes per day most days of the week. We discussed the  [employer] fitness program, which is designed for cancer survivors to help them become more physically fit after cancer treatments.  She was instructed to limit her alcohol consumption and continue to abstain from tobacco use.     6. Support services/counseling: It is not uncommon for this period of the patient's cancer care trajectory to be one of many emotions and stressors.  She was given information regarding our available services and encouraged to contact me with any questions or for help enrolling in any of our support group/programs.    Follow up instructions:    -Return to cancer center in November 2023 for follow-up with Dr. Lindi Adie -Mammogram due in November 2023 -Follow up with surgery May 2024 -She is welcome to return back to the Survivorship Clinic at any time; no additional follow-up needed at this time.  -Consider referral back to survivorship as a long-term survivor for continued surveillance  The patient was provided an opportunity to ask questions and all were answered. The patient agreed with the plan and demonstrated an understanding of the instructions.   Total encounter time:40 minutes*in face-to-face visit time, chart review, lab review, care coordination, order entry, and documentation of the encounter time.    Wilber Bihari, NP 04/15/22 10:25 AM Medical Oncology and Hematology [facility] [place], [place] [address] Tel. [phone]    Fax. [phone]  *Total Encounter Time as defined by the Centers for Medicare and Medicaid Services includes, in addition to the face-to-face time of a patient visit (documented in the note above) non-face-to-face time: obtaining and reviewing outside history, ordering and reviewing medications, tests or procedures, care coordination (communications with other health care professionals or caregivers) and documentation in the medical record.

## 2022-04-16 ENCOUNTER — Telehealth: Payer: Self-pay | Admitting: Hematology and Oncology

## 2022-04-16 NOTE — Telephone Encounter (Signed)
Per 8/2 in basket, message has been left

## 2022-04-22 ENCOUNTER — Encounter: Payer: Self-pay | Admitting: Hematology and Oncology

## 2022-04-28 DIAGNOSIS — L814 Other melanin hyperpigmentation: Secondary | ICD-10-CM | POA: Diagnosis not present

## 2022-04-28 DIAGNOSIS — L821 Other seborrheic keratosis: Secondary | ICD-10-CM | POA: Diagnosis not present

## 2022-04-28 DIAGNOSIS — C44311 Basal cell carcinoma of skin of nose: Secondary | ICD-10-CM | POA: Diagnosis not present

## 2022-04-28 DIAGNOSIS — L820 Inflamed seborrheic keratosis: Secondary | ICD-10-CM | POA: Diagnosis not present

## 2022-04-28 DIAGNOSIS — D485 Neoplasm of uncertain behavior of skin: Secondary | ICD-10-CM | POA: Diagnosis not present

## 2022-04-28 DIAGNOSIS — D225 Melanocytic nevi of trunk: Secondary | ICD-10-CM | POA: Diagnosis not present

## 2022-04-28 DIAGNOSIS — L430 Hypertrophic lichen planus: Secondary | ICD-10-CM | POA: Diagnosis not present

## 2022-04-28 DIAGNOSIS — D1801 Hemangioma of skin and subcutaneous tissue: Secondary | ICD-10-CM | POA: Diagnosis not present

## 2022-05-28 DIAGNOSIS — C44311 Basal cell carcinoma of skin of nose: Secondary | ICD-10-CM | POA: Diagnosis not present

## 2022-06-08 ENCOUNTER — Inpatient Hospital Stay: Payer: Medicare HMO | Attending: Hematology and Oncology | Admitting: Genetic Counselor

## 2022-06-08 ENCOUNTER — Other Ambulatory Visit: Payer: Self-pay | Admitting: Genetic Counselor

## 2022-06-08 ENCOUNTER — Inpatient Hospital Stay: Payer: Medicare HMO

## 2022-06-08 ENCOUNTER — Other Ambulatory Visit: Payer: Self-pay

## 2022-06-08 DIAGNOSIS — Z800 Family history of malignant neoplasm of digestive organs: Secondary | ICD-10-CM | POA: Diagnosis not present

## 2022-06-08 DIAGNOSIS — C50412 Malignant neoplasm of upper-outer quadrant of left female breast: Secondary | ICD-10-CM

## 2022-06-08 DIAGNOSIS — Z170 Estrogen receptor positive status [ER+]: Secondary | ICD-10-CM

## 2022-06-08 DIAGNOSIS — Z808 Family history of malignant neoplasm of other organs or systems: Secondary | ICD-10-CM

## 2022-06-08 LAB — GENETIC SCREENING ORDER

## 2022-06-08 NOTE — Progress Notes (Unsigned)
REFERRING PROVIDER: Gardenia Phlegm, NP [place],  [place] [address]   PRIMARY PROVIDER:  Eulas Post, MD  PRIMARY REASON FOR VISIT:  Encounter Diagnoses  Name Primary?   Malignant neoplasm of upper-outer quadrant of left breast in female, estrogen receptor positive ([place]) Yes   Family history of pancreatic cancer    Family history of thyroid cancer     HISTORY OF PRESENT ILLNESS:   Laura [name], a 66 y.o. female, was seen for a [place] cancer genetics consultation at the request of Thedore Mins, NP due to a personal and family history of cancer.  Ms. [place] presents to clinic today to discuss the possibility of a hereditary predisposition to cancer, to discuss genetic testing, and to further clarify her future cancer risks, as well as potential cancer risks for family members.   In December 2022, at the age of 55, Ms. Berkheimer had a biopsy of the left breast that revealed atypical lobular hyperplasia.  Lumpectomy revealed invasive lobular carcinoma (ER+/PR+/HER2-).  She also has a history of skin cancer (non-melanoma), diagnosed this year, on her nose. She reported a non-melanoma skin cancer under her breast in 2009.    CANCER HISTORY:  Oncology History  Malignant neoplasm of upper-outer quadrant of left breast in female, estrogen receptor positive ([place])  08/20/2021 Initial Diagnosis   Screening mammogram detected possible distortion in the left breast: Biopsy: Atypical lobular hyperplasia   11/04/2021 Surgery   Left lumpectomy: Grade 2 invasive lobular cancer pleomorphic features, 5 mm, margins negative, ER 100%, PR 95%, Ki-67 2%, HER2 2+ by IHC, FISH negative, ratio 1.31   11/04/2021 Cancer Staging   Staging form: Breast, AJCC 8th Edition - Pathologic stage from 11/04/2021: Stage IA (pT1a, pN0, cM0, G2, ER+, PR+, HER2-) - Signed by Gardenia Phlegm, NP on 04/15/2022 Stage prefix: Initial diagnosis Histologic grading system: 3 grade system    12/16/2021 - 01/13/2022 Radiation Therapy   Site Technique Total Dose (Gy) Dose per Fx (Gy) Completed Fx Beam Energies  Breast, Left: Breast_L 3D 40.05/40.05 2.67 15/15 10X  Breast, Left: Breast_L_Bst 3D 12/12 2 6/6 6X, 10X     01/2022 -  Anti-estrogen oral therapy   Tamoxifen      RISK FACTORS:  Colonoscopy: yes;  most recent in 2019; repeat in 10 years . Hysterectomy: yes; at age 30 Ovaries intact: L salpingo-ophorectomy at age 64; R ovary intact.  Menarche was at age 52.  First live birth at age 52.  OCP use for less than 10 years.  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 Pt voices no issues with heart related problems.   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   GERD 01/25/2008   uses Prevacid as needed   History of radiation therapy    Left Breast- 12/16/21-01/13/22- Dr. Gery Pray   HYPERGLYCEMIA    HYPERTENSION 08/05/2010   Impaired glucose tolerance  01/11/2011   METATARSALGIA 11/08/2007   work related- no recent issues"wearing steel toe shoes at the time"   PONV (postoperative nausea and vomiting)    Previous back surgery 10/26/17, 05/26/18   RECTAL BLEEDING 01/04/2008   06-18-15 no problems now.   Tendonitis    right wrist-has soft splint in place.   Tibialis tendinitis 05/13/2010   Unspecified disorder of liver 01/19/2008   evaluated and thought to be related to pain med use at current time- no further issues  noted.   VENTRICULAR HYPERTROPHY, LEFT 09/23/2010   Wears glasses     Past Surgical History:  Procedure Laterality Date   ABDOMINAL HYSTERECTOMY  2002   TAH-LTSO-LOA   ARTHROSCOPIC REPAIR ACL Right 10/1998   right knee    BACK SURGERY  2019   BREAST EXCISIONAL BIOPSY Left 2015   benign   BREAST SURGERY Left    lumpectomy-benign 2015   CARDIOVASCULAR STRESS TEST  2011   CARPAL TUNNEL RELEASE  2014   left   CHOLECYSTECTOMY  1985   open   COLONOSCOPY     COLONOSCOPY W/ POLYPECTOMY     DILATION AND CURETTAGE OF UTERUS     ELBOW ARTHROPLASTY  2014   left   ESOPHAGOGASTRODUODENOSCOPY  04/01/1998   KNEE ARTHROSCOPY Bilateral    meniscus tear-left.   NASAL SINUS SURGERY     s/p   ORIF ELBOW FRACTURE  12/23/2011   Procedure: OPEN REDUCTION INTERNAL FIXATION (ORIF) ELBOW/OLECRANON FRACTURE;  Surgeon: Linna Hoff, MD;  Location: [facility];  Service: Orthopedics;  Laterality: Left;  LEFT ELBOW ORIF OF PROXIMAL OLECRANON AND RADIAL HEAD, POSSIBLE ARTHROPLASTY   OVARIAN CYST REMOVAL     RADIOACTIVE SEED GUIDED EXCISIONAL BREAST BIOPSY Left 11/04/2021   Procedure: RADIOACTIVE SEED GUIDED EXCISIONAL LEFT BREAST BIOPSY;  Surgeon: Rolm Bookbinder, MD;  Location: [place];  Service: General;  Laterality: Left;   TOTAL KNEE ARTHROPLASTY Right 06/24/2015   Procedure: RIGHT TOTAL KNEE ARTHROPLASTY;  Surgeon: Gaynelle Arabian, MD;  [facility];  Service: Orthopedics;  Laterality: Right;   TRANSFORAMINAL LUMBAR INTERBODY FUSION (TLIF) WITH PEDICLE SCREW FIXATION 1 LEVEL Left 03/03/2019   Procedure: Left Lumbar four-five Redo Microdiscectomy with transforaminal lumbar interbody fusion;  Surgeon: Erline Levine, MD;  Location: [place];  Service: Neurosurgery;  Laterality: Left;   UPPER GASTROINTESTINAL ENDOSCOPY     US ECHOCARDIOGRAPHY  2011    FAMILY HISTORY:  We obtained a detailed, 4-generation family history.  Significant diagnoses are listed below: Family History  Problem  Relation Age of Onset   Lung cancer Mother        no smoking hx   Other Brother        benign brain mass in late 13s   Pancreatic cancer Maternal Aunt        dx and d. late 75s   Cancer Maternal Aunt        unknown GYN; mets; dx and d. 60s   Thyroid cancer Maternal Aunt        mets; dx and d. 59s      Ms. Holdman is unaware of previous family history of genetic testing for hereditary cancer risks.There is no reported Ashkenazi Jewish ancestry. There is no known consanguinity.  GENETIC COUNSELING ASSESSMENT: Ms. Sanford is a 66 y.o. female with a personal and family history of cancer which is somewhat suggestive of a hereditary cancer syndrome and predisposition to cancer given the presence of related cancers in her maternal family (breast, pancreatic,  GYN, etc.). We, therefore, discussed and recommended the following at today's visit.   DISCUSSION: We discussed that 5 - 10% of cancer is hereditary.  Most cases of hereditary breast and pancreatic cancer are associated with mutations in BRCA1/2.  There are other genes that can be associated with hereditary breast, pancreatic, GYN, or thyroid cancer syndromes.  We discussed that testing is beneficial for several reasons including knowing how to follow individuals for their cancer risks and understanding if other family members could be at risk for cancer and allowing them to undergo genetic testing.    We reviewed the characteristics, features and inheritance patterns of hereditary cancer syndromes. We also discussed genetic testing, including the appropriate family members to test, the process of testing, insurance coverage and turn-around-time for results. We discussed the implications of a negative, positive, carrier and/or variant of uncertain significant result. We recommended Ms. Spare pursue genetic testing for a panel that includes genes associated with breast, GYN, pancreatic, and thyroid cancer. Given her history of a biopsy-proven  angiofibroma and her brother with a brain mass, it is also appropriate to include TSC1/2.   The CancerNext-Expanded gene panel offered by [facility] and includes sequencing, rearrangement, and RNA analysis for the following 77 genes: AIP, ALK, APC, ATM, AXIN2, BAP1, BARD1, BLM, BMPR1A, BRCA1, BRCA2, BRIP1, CDC73, CDH1, CDK4, CDKN1B, CDKN2A, CHEK2, CTNNA1, DICER1, FANCC, FH, FLCN, GALNT12, KIF1B, LZTR1, MAX, MEN1, MET, MLH1, MSH2, MSH3, MSH6, MUTYH, NBN, NF1, NF2, NTHL1, PALB2, PHOX2B, PMS2, POT1, PRKAR1A, PTCH1, PTEN, RAD51C, RAD51D, RB1, RECQL, RET, SDHA, SDHAF2, SDHB, SDHC, SDHD, SMAD4, SMARCA4, SMARCB1, SMARCE1, STK11, SUFU, TMEM127, TP53, TSC1, TSC2, VHL and XRCC2 (sequencing and deletion/duplication); EGFR, EGLN1, HOXB13, KIT, MITF, PDGFRA, POLD1, and POLE (sequencing only); EPCAM and GREM1 (deletion/duplication only).   Based on Ms. Reamer's personal and family history of cancer, she meets medical criteria for genetic testing. Despite that she meets criteria, she may still have an out of pocket cost. We discussed that if her out of pocket cost for testing is over $100, the laboratory should contact her and discuss the self-pay prices and/or patient pay assistance programs.    PLAN: After considering the risks, benefits, and limitations, Ms. Lucking provided informed consent to pursue genetic testing and the blood sample was sent to [facility] for analysis of the CancerNext-Expanded +RNAinsight Panel. Results should be available within approximately 3 weeks' time, at which point they will be disclosed by telephone to Ms. Halle, as will any additional recommendations warranted by these results. Ms. Debruyn will receive a summary of her genetic counseling visit and a copy of her results once available. This information will also be available in Epic.   Ms. Tomczak questions were answered to her satisfaction today. Our contact information was provided should additional questions or concerns  arise. Thank you for the referral and allowing [place] to share in the care of your patient.   Cari M. Joette Catching, [place], [facility] Genetic Counselor [email or web address] ([phone]  The patient was seen for a total of 35 minutes in face-to-face genetic counseling.  The patient was seen alone.  Drs. Lindi Adie and/or Burr Medico were available to discuss this case as needed.    _______________________________________________________________________ For Office Staff:  Number of people involved in session: 1 Was an Intern/ student involved with case: no

## 2022-06-10 ENCOUNTER — Encounter: Payer: Self-pay | Admitting: Genetic Counselor

## 2022-06-10 ENCOUNTER — Encounter: Payer: Self-pay | Admitting: Hematology and Oncology

## 2022-06-24 ENCOUNTER — Ambulatory Visit: Payer: Self-pay | Admitting: Genetic Counselor

## 2022-06-24 ENCOUNTER — Encounter: Payer: Self-pay | Admitting: Genetic Counselor

## 2022-06-24 ENCOUNTER — Telehealth: Payer: Self-pay | Admitting: Genetic Counselor

## 2022-06-24 DIAGNOSIS — Z1501 Genetic susceptibility to malignant neoplasm of breast: Secondary | ICD-10-CM

## 2022-06-24 DIAGNOSIS — C50412 Malignant neoplasm of upper-outer quadrant of left female breast: Secondary | ICD-10-CM

## 2022-06-24 DIAGNOSIS — Z808 Family history of malignant neoplasm of other organs or systems: Secondary | ICD-10-CM

## 2022-06-24 DIAGNOSIS — Z1379 Encounter for other screening for genetic and chromosomal anomalies: Secondary | ICD-10-CM

## 2022-06-24 DIAGNOSIS — Z800 Family history of malignant neoplasm of digestive organs: Secondary | ICD-10-CM

## 2022-06-24 HISTORY — DX: Genetic susceptibility to malignant neoplasm of breast: Z15.01

## 2022-06-24 NOTE — Progress Notes (Signed)
GENETIC TEST RESULTS  Patient Name: Laura [name] Patient Age: 66 y.o. Encounter Date: 06/24/2022  Referring Provider: Wilber Bihari, NP  Ms. Laura [name] was seen in the [facility] on June 08, 2022 due to a personal history of breast cancer and concern regarding a hereditary predisposition to cancer in the family. Please refer to the prior Genetics clinic note for more information regarding Ms. Laura [name]'s medical and family histories and our assessment at the time.   In December 2022, at the age of 30, Ms. Laura [name] had a biopsy of the left breast that revealed atypical lobular hyperplasia.  Lumpectomy revealed invasive lobular carcinoma (ER+/PR+/HER2-).  She also has a history of skin cancer (non-melanoma), diagnosed this year, on her nose.   FAMILY HISTORY:  We obtained a detailed, 4-generation family history.  Significant diagnoses are listed below:      Family History  Problem Relation Age of Onset   Lung cancer Mother          no smoking hx   Other Brother          benign brain mass in late 44s   Pancreatic cancer Maternal Aunt          dx and d. late 77s   Cancer Maternal Aunt          unknown GYN; mets; dx and d. 60s   Thyroid cancer Maternal Aunt          mets; dx and d. 15s         Ms. Laura [name] is unaware of previous family history of genetic testing for hereditary cancer risks.There is no reported Ashkenazi Jewish ancestry. There is no known consanguinity.  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  No other pathogenic variants were detected in the [place] +RNAinsight Panel.   The CancerNext-Expanded gene panel offered by [facility] and includes sequencing, rearrangement, and RNA analysis for the following 77 genes: AIP, ALK, APC, ATM, AXIN2, BAP1, BARD1, BLM, BMPR1A, BRCA1, BRCA2, BRIP1, CDC73, CDH1, CDK4, CDKN1B, CDKN2A,  CHEK2, CTNNA1, DICER1, FANCC, FH, FLCN, GALNT12, KIF1B, LZTR1, MAX, MEN1, MET, MLH1, MSH2, MSH3, MSH6, MUTYH, NBN, NF1, NF2, NTHL1, PALB2, PHOX2B, PMS2, POT1, PRKAR1A, PTCH1, PTEN, RAD51C, RAD51D, RB1, RECQL, RET, SDHA, SDHAF2, SDHB, SDHC, SDHD, SMAD4, SMARCA4, SMARCB1, SMARCE1, STK11, SUFU, TMEM127, TP53, TSC1, TSC2, VHL and XRCC2 (sequencing and deletion/duplication); EGFR, EGLN1, HOXB13, KIT, MITF, PDGFRA, POLD1, and POLE (sequencing only); EPCAM and GREM1 (deletion/duplication only).   The test report has been scanned into EPIC and is located under the Molecular Pathology section of the Results Review tab.  A portion of the result report is included below for reference. Genetic testing reported out on June 23, 2022.       Cancer Risks for CHEK2: Women have a 20-40% lifetime risk of breast cancer. Emerging evidence for increased prostate cancer risk in males. The exact risk figure is unknown at this time. 8-9% lifetime risk of colorectal cancer Data suggests a possible increased risk for ovarian, endometrial, thyroid, and other types of cancer.  Not enough evidence for definitive association at this time.    Research is continuing to help learn more about the cancers associated with CHEK2 pathogenic variants and what the exact risks are to develop these cancers.  Management Recommendations:  Breast Cancer Screening/Risk Reduction for females: Breast cancer screening includes: Breast awareness beginning at age 89 Monthly self-breast examination beginning at  age 57 Clinical breast examination every 6-12 months beginning at age 87 or at the age of the earliest diagnosed breast cancer in the family, if onset was before age 41 Annual mammogram with consideration of tomosynthesis starting at age 9 or 60 years prior to the youngest age of diagnosis, whichever comes first Consider breast MRI with contrast starting at age 26-35 Evidence is insufficient for a prophylactic risk-reducing  mastectomy, manage based on family history    Colon Cancer Screening: Colonoscopy screening every 5 years beginning at age 6 If an individual has a first-degree relative with colorectal cancer, screening should begin 10 years prior to the relative's age at diagnosis if before 10. If an individual has a personal history of colorectal cancer, screening recommendations should be based on recommendations for post-colorectal cancer resection. Studies have demonstrated that the use of daily aspirin decreases the colorectal cancer risk in patients with an increased risk for colorectal cancer. Ongoing studies are investigating the optimal dose and duration of use of aspirin for colon cancer prevention. The decision to use aspirin should be made on an individualized basis including a discussion of dose, benefits, and adverse effects.   Prostate Cancer Screening for males: Consider beginning annual PSA blood test and digital rectal exams at age 64.   This information is based on current understanding of the gene and may change in the future.   Implications for Family Members: Hereditary predisposition to cancer due to pathogenic variants in the CHEK2 gene has autosomal dominant inheritance. This means that an individual with a pathogenic variant has a 50% chance of passing the condition on to his/her offspring. Identification of a pathogenic variant allows for the recognition of at-risk relatives who can pursue testing for the familial variant.   Family members are encouraged to consider genetic testing for this familial pathogenic variant. As there are generally no childhood cancer risks associated with pathogenic variants in the CHEK2 gene, individuals in the family are not recommended to have testing until they reach at least 65 years of age. Complimentary testing for the familial variant is available for 90 days. They may contact our office at [phone] for more information or to schedule an  appointment. Family members who live outside of the area are encouraged to find a genetic counselor in their area by visiting: [email or web address].   Resources: FORCE (Facing Our Risk of Cancer Empowered) is a resource for those with a hereditary predisposition to develop cancer.  FORCE provides information about risk reduction, advocacy, legislation, and clinical trials.  Additionally, FORCE provides a platform for collaboration and support; which includes: peer navigation, message boards, local support groups, a toll-free helpline, research registry and recruitment, advocate training, published medical research, webinars, brochures, mastectomy photos, and more.  For more information, visit www.facingourrisk.org  Plan:  Ms. Gullick has a mammogram and appointment with her medical oncologist Dr. Lindi Adie scheduled for November.  Given her CHEK2 mutation, we recommend that she discuss appropriate breast cancer screening and the benefits/limitations of annual breast MRIs with Dr. Lindi Adie.  Ms. Motter had a colonoscopy with Dr. Loletha Carrow in 2019 with recommendations to repeat colonoscopy in 10 years.  Her CHEK2 mutation qualifies her for colonoscopies every 5 years.  Dr. Loletha Carrow notified.  Ms. Dolloff knows to schedule appointment for 5 year interval. Letter provided to encourage family testing.  Ms. Vargus mother is treated by Dr. Julien Nordmann, and genetics appointment was made for her per request.  She plans to share information with son, who lives in [place], brother,  and other relatives.   Our contact number was provided. Ms. Elie questions were answered to her satisfaction, and she knows she is welcome to call us at anytime with additional questions or concerns.   Madgie Dhaliwal M. Joette Catching, [place], [facility] Certified Film/video [email or web address] (P) [phone]

## 2022-06-24 NOTE — Telephone Encounter (Signed)
Revealed CHEK2 mutation.  Discussed cancer risks, management,a nd family implications. See detailed clinic note.

## 2022-06-29 ENCOUNTER — Encounter: Payer: Self-pay | Admitting: Family Medicine

## 2022-06-29 ENCOUNTER — Ambulatory Visit (INDEPENDENT_AMBULATORY_CARE_PROVIDER_SITE_OTHER): Payer: Medicare HMO | Admitting: Family Medicine

## 2022-06-29 VITALS — BP 162/72 | HR 73 | Temp 98.3°F | Ht 67.0 in | Wt 174.3 lb

## 2022-06-29 DIAGNOSIS — M62830 Muscle spasm of back: Secondary | ICD-10-CM

## 2022-06-29 DIAGNOSIS — F5102 Adjustment insomnia: Secondary | ICD-10-CM | POA: Diagnosis not present

## 2022-06-29 DIAGNOSIS — R69 Illness, unspecified: Secondary | ICD-10-CM | POA: Diagnosis not present

## 2022-06-29 MED ORDER — TRAZODONE HCL 50 MG PO TABS: 25.0000 mg | ORAL_TABLET | Freq: Every evening | ORAL | 3 refills | Status: DC | PRN

## 2022-06-29 MED ORDER — CYCLOBENZAPRINE HCL 10 MG PO TABS: 10.0000 mg | ORAL_TABLET | Freq: Three times a day (TID) | ORAL | 0 refills | Status: DC | PRN

## 2022-06-29 NOTE — Progress Notes (Signed)
Established Patient Office Visit  Subjective   Patient ID: Laura [name], female    DOB: [birth year]  Age: 66 y.o. MRN: [id number]  Chief Complaint  Patient presents with   Spasms    X3 days     HPI   Laura [name] is seen with onset this past Friday of "muscle spasms "thoracic area more on the right side.  Her mother is dealing with cancer and she is the primary caregiver.  She feels like this is causing tremendous stress.  She has had poor sleep quality at night for some time and sometimes only getting a couple hours sleep at night.  She feels like this is exacerbating her back situation.  She has had back surgery in the past.  Has had muscle spasm in the past.  Has some leftover Robaxin which did not seem to help much.  She thinks she had better result with Flexeril in the past.  She tried heat without much improvement. Chest pain.  No pleuritic pain.  No cough.  Regarding her sleep she tried melatonin without benefit.  No alcohol use.  Past Medical History:  Diagnosis Date   ABNORMAL ELECTROCARDIOGRAM 08/05/2010   02-19-15 EKG shows NSR. Pt voices no issues with heart related problems.   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   GERD 01/25/2008   uses Prevacid as needed   History of radiation therapy    Left Breast- 12/16/21-01/13/22- Dr. Gery Pray   HYPERGLYCEMIA    HYPERTENSION 08/05/2010   Impaired glucose tolerance 01/11/2011   METATARSALGIA 11/08/2007   work related- no recent issues"wearing steel toe shoes at the time"   Monoallelic mutation of CHEK2 gene in female patient 06/24/2022   PONV (postoperative nausea and  vomiting)    Previous back surgery 10/26/17, 05/26/18   RECTAL BLEEDING 01/04/2008   06-18-15 no problems now.   Tendonitis    right wrist-has soft splint in place.   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   VENTRICULAR HYPERTROPHY, LEFT 09/23/2010   Wears glasses    Past Surgical History:  Procedure Laterality Date   ABDOMINAL HYSTERECTOMY  2002   TAH-LTSO-LOA   ARTHROSCOPIC REPAIR ACL Right 10/1998   right knee    BACK SURGERY  2019   BREAST EXCISIONAL BIOPSY Left 2015   benign   BREAST SURGERY Left    lumpectomy-benign 2015   CARDIOVASCULAR STRESS TEST  2011   CARPAL TUNNEL RELEASE  2014   left   CHOLECYSTECTOMY  1985   open   COLONOSCOPY     COLONOSCOPY W/ POLYPECTOMY     DILATION AND CURETTAGE OF UTERUS     ELBOW ARTHROPLASTY  2014   left   ESOPHAGOGASTRODUODENOSCOPY  04/01/1998   KNEE ARTHROSCOPY Bilateral    meniscus tear-left.   NASAL SINUS SURGERY     s/p   ORIF ELBOW FRACTURE  12/23/2011   Procedure: OPEN REDUCTION INTERNAL FIXATION (ORIF) ELBOW/OLECRANON FRACTURE;  Surgeon: Linna Hoff, MD;  Location: [place];  Service: Orthopedics;  Laterality: Left;  LEFT ELBOW ORIF OF PROXIMAL OLECRANON AND RADIAL HEAD, POSSIBLE ARTHROPLASTY   OVARIAN CYST REMOVAL     RADIOACTIVE SEED GUIDED EXCISIONAL BREAST BIOPSY Left 11/04/2021   Procedure: RADIOACTIVE SEED GUIDED EXCISIONAL LEFT BREAST BIOPSY;  Surgeon: Rolm Bookbinder, MD;  Location: [place];  Service: General;  Laterality: Left;   TOTAL KNEE ARTHROPLASTY Right 06/24/2015   Procedure: RIGHT TOTAL KNEE ARTHROPLASTY;  Surgeon: Gaynelle Arabian, MD;  Location: [facility];  Service: Orthopedics;  Laterality: Right;   TRANSFORAMINAL LUMBAR INTERBODY FUSION (TLIF) WITH PEDICLE SCREW FIXATION 1 LEVEL Left 03/03/2019   Procedure: Left Lumbar four-five Redo Microdiscectomy with  transforaminal lumbar interbody fusion;  Surgeon: Erline Levine, MD;  Location: [place];  Service: Neurosurgery;  Laterality: Left;   UPPER GASTROINTESTINAL ENDOSCOPY     US ECHOCARDIOGRAPHY  2011    reports that she has never smoked. She has never used smokeless tobacco. She reports that she does not drink alcohol and does not use drugs. family history includes Cancer in her maternal aunt; Diabetes in her mother; Heart disease (age of onset: 48) in her father; Lung cancer in her mother; Other in her brother; Pancreatic cancer in her maternal aunt; Thyroid cancer in her maternal aunt; Thyroid disease in her mother. Allergies  Allergen Reactions   Zometa [Zoledronic Acid] Other (See Comments)    Fever, chills, required ED visit   Other Rash    dermabond    Review of Systems  Constitutional:  Negative for chills and fever.  Respiratory:  Negative for cough and shortness of breath.   Cardiovascular:  Negative for chest pain.  Skin:  Negative for rash.  Neurological:  Negative for headaches.  Psychiatric/Behavioral:  The patient has insomnia.       Objective:     BP (!) 162/72 (BP Location: Right Arm, Cuff Size: Normal)   Pulse 73   Temp 98.3 F (36.8 C) (Oral)   Ht '5\' 7"'  (1.702 m)   Wt 174 lb 4.8 oz (79.1 kg)   SpO2 99%   BMI 27.30 kg/m    Physical Exam Vitals reviewed.  Constitutional:      Appearance: Normal appearance.  Cardiovascular:     Rate and Rhythm: Normal rate and regular rhythm.  Pulmonary:     Effort: Pulmonary effort is normal.     Breath sounds: Normal breath sounds. No wheezing or rales.  Musculoskeletal:     Comments: Minimal muscular soreness just below the right scapular region.  Good range of motion cervical spine  Neurological:     Mental Status: She is alert.      No results found for any visits on 06/29/22.    The 10-year ASCVD risk score (Arnett DK, et al., 2019) is: 11.1%    Assessment & Plan:   #1 probable back muscle spasms.   Continue moist heat.  Walking as tolerated and stretches as tolerated.  Wrote for Flexeril 10 mg nightly and she is aware this will likely be sedating.  Consider trial of physical therapy not improving 1 to 2 weeks  #2 transient insomnia.  No relief with melatonin.  Wrote for trial of trazodone 50 mg nightly but she will wait till she is off the Flexeril to start that.  She will use as needed.  Handout on insomnia given   No follow-ups on file.    Carolann Littler, MD

## 2022-07-16 ENCOUNTER — Inpatient Hospital Stay: Payer: Medicare HMO

## 2022-07-16 ENCOUNTER — Inpatient Hospital Stay: Payer: Medicare HMO | Admitting: Hematology and Oncology

## 2022-07-20 ENCOUNTER — Ambulatory Visit
Admission: RE | Admit: 2022-07-20 | Discharge: 2022-07-20 | Disposition: A | Discharge status: Home / Self Care | Payer: Medicare HMO | Source: Ambulatory Visit | Attending: Adult Health | Admitting: Adult Health

## 2022-07-20 DIAGNOSIS — Z853 Personal history of malignant neoplasm of breast: Secondary | ICD-10-CM | POA: Diagnosis not present

## 2022-07-20 DIAGNOSIS — C50412 Malignant neoplasm of upper-outer quadrant of left female breast: Secondary | ICD-10-CM

## 2022-07-20 DIAGNOSIS — R92323 Mammographic fibroglandular density, bilateral breasts: Secondary | ICD-10-CM | POA: Diagnosis not present

## 2022-07-21 ENCOUNTER — Other Ambulatory Visit: Payer: Self-pay | Admitting: Family Medicine

## 2022-07-22 ENCOUNTER — Other Ambulatory Visit: Payer: Self-pay | Admitting: Hematology and Oncology

## 2022-07-26 NOTE — Progress Notes (Signed)
Patient Care Team: Eulas Post, MD as PCP - General (Family Medicine) Barbaraann Rondo, MD (Obstetrics and Gynecology) Gery Pray, MD as Consulting Physician (Radiation Oncology) Rolm Bookbinder, MD as Consulting Physician (General Surgery) Nicholas Lose, MD as Consulting Physician (Hematology and Oncology)  DIAGNOSIS:  Encounter Diagnoses  Name Primary?   Malignant neoplasm of upper-outer quadrant of left breast in female, estrogen receptor positive ([place]) Yes   Post-menopausal     SUMMARY OF ONCOLOGIC HISTORY: Oncology History  Malignant neoplasm of upper-outer quadrant of left breast in female, estrogen receptor positive ([place])  08/20/2021 Initial Diagnosis   Screening mammogram detected possible distortion in the left breast: Biopsy: Atypical lobular hyperplasia   11/04/2021 Surgery   Left lumpectomy: Grade 2 invasive lobular cancer pleomorphic features, 5 mm, margins negative, ER 100%, PR 95%, Ki-67 2%, HER2 2+ by IHC, FISH negative, ratio 1.31   11/04/2021 Cancer Staging   Staging form: Breast, AJCC 8th Edition - Pathologic stage from 11/04/2021: Stage IA (pT1a, pN0, cM0, G2, ER+, PR+, HER2-) - Signed by Gardenia Phlegm, NP on 04/15/2022 Stage prefix: Initial diagnosis Histologic grading system: 3 grade system   12/16/2021 - 01/13/2022 Radiation Therapy   Site Technique Total Dose (Gy) Dose per Fx (Gy) Completed Fx Beam Energies  Breast, Left: Breast_L 3D 40.05/40.05 2.67 15/15 10X  Breast, Left: Breast_L_Bst 3D 12/12 2 6/6 6X, 10X     01/2022 -  Anti-estrogen oral therapy   Tamoxifen   06/23/2022 Genetic Testing   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  Report date is 06/23/2022.   The CancerNext-Expanded gene panel offered by [facility] and includes sequencing, rearrangement, and RNA analysis for the following 77 genes: AIP, ALK, APC, ATM, AXIN2, BAP1, BARD1, BLM, BMPR1A, BRCA1, BRCA2, BRIP1, CDC73, CDH1, CDK4, CDKN1B, CDKN2A,  CHEK2, CTNNA1, DICER1, FANCC, FH, FLCN, GALNT12, KIF1B, LZTR1, MAX, MEN1, MET, MLH1, MSH2, MSH3, MSH6, MUTYH, NBN, NF1, NF2, NTHL1, PALB2, PHOX2B, PMS2, POT1, PRKAR1A, PTCH1, PTEN, RAD51C, RAD51D, RB1, RECQL, RET, SDHA, SDHAF2, SDHB, SDHC, SDHD, SMAD4, SMARCA4, SMARCB1, SMARCE1, STK11, SUFU, TMEM127, TP53, TSC1, TSC2, VHL and XRCC2 (sequencing and deletion/duplication); EGFR, EGLN1, HOXB13, KIT, MITF, PDGFRA, POLD1, and POLE (sequencing only); EPCAM and GREM1 (deletion/duplication only).      CHIEF COMPLIANT: Follow-up on  Prolia  INTERVAL HISTORY: Laura [name] is a 66 y.o. female is here because of recent diagnosis of left breast w/seed, lumpectomy. She presents to the clinic today for a follow-up. She reports that she has some severe hot flashes and some mild joint stiffness. Denies any pain and discomfort in breast.   ALLERGIES:  is allergic to zometa [zoledronic acid] and other.  MEDICATIONS:  Current Outpatient Medications  Medication Sig Dispense Refill   amLODipine (NORVASC) 5 MG tablet Take 1 tablet (5 mg total) by mouth daily. 90 tablet 3   Aspirin (VAZALORE) 81 MG CAPS Take 81 mg by mouth daily.     calcium carbonate (OS-CAL - DOSED IN MG OF ELEMENTAL CALCIUM) 1250 (500 Ca) MG tablet Take 1 tablet by mouth.     cyclobenzaprine (FLEXERIL) 10 MG tablet Take 1 tablet (10 mg total) by mouth 3 (three) times daily as needed for muscle spasms. 30 tablet 0   ketoconazole (NIZORAL) 2 % cream Apply 1 Application topically daily. 15 g 0   losartan (COZAAR) 100 MG tablet Take 1 tablet (100 mg total) by mouth daily. 90 tablet 3   Multiple Vitamin (MULTIVITAMIN) tablet Take 1 tablet by mouth daily.  tamoxifen (NOLVADEX) 20 MG tablet TAKE 1 TABLET BY MOUTH EVERY DAY 90 tablet 1   traZODone (DESYREL) 50 MG tablet Take 0.5-1 tablets (25-50 mg total) by mouth at bedtime as needed for sleep. 30 tablet 3   vitamin E 400 UNIT capsule Take 400 Units by mouth daily.     No current  facility-administered medications for this visit.    PHYSICAL EXAMINATION: ECOG PERFORMANCE STATUS: 1 - Symptomatic but completely ambulatory  Vitals:   07/31/22 0823  BP: (!) 159/70  Pulse: 63  Resp: 18  Temp: 97.9 F (36.6 C)  SpO2: 99%   Filed Weights   07/31/22 0823  Weight: 174 lb 12.8 oz (79.3 kg)      LABORATORY DATA:  I have reviewed the data as listed    Latest Ref Rng & Units 07/31/2022    8:11 AM 01/22/2022    2:43 AM 01/13/2022   10:17 AM  CMP  Glucose 70 - 99 mg/dL 111  166  134   BUN 8 - 23 mg/dL _0 Creatinine 0.44 - 1.00 mg/dL 0.57  0.60  0.61   Sodium 135 - 145 mmol/L 140  138  142   Potassium 3.5 - 5.1 mmol/L 4.4  3.9  4.0   Chloride 98 - 111 mmol/L 106  105  107   CO2 22 - 32 mmol/L _1 Calcium 8.9 - 10.3 mg/dL 9.7  9.0  9.3   Total Protein 6.5 - 8.1 g/dL 7.4  7.7  7.2   Total Bilirubin 0.3 - 1.2 mg/dL 0.5  0.7  0.5   Alkaline Phos 38 - 126 U/L 62  97  111   AST 15 - 41 U/L 40  30  21   ALT 0 - 44 U/L 39  28  23     Lab Results  Component Value Date   WBC 6.6 07/31/2022   HGB 13.0 07/31/2022   HCT 39.8 07/31/2022   MCV 93.2 07/31/2022   PLT 240 07/31/2022   NEUTROABS 3.5 07/31/2022    ASSESSMENT & PLAN:  Malignant neoplasm of upper-outer quadrant of left breast in female, estrogen receptor positive ([place]) 11/04/2021: Screening mammogram detected possible distortion in the left breast initial biopsy was [facility], lumpectomy revealed grade 2 invasive lobular cancer with pleomorphic features measuring 5 mm size, margins negative, ER 100%, PR 95%, Ki-67 2%, HER2 negative (2+ by IHC but FISH negative) 12/17/2021-01/13/2022: Adjuvant radiation   Current treatment: Adjuvant antiestrogen therapy with tamoxifen (because of osteoporosis) started 01/13/2022  Tamoxifen toxicities: Moderate to severe hot flashes Muscle cramps  Osteoporosis: T score -2.8 in 2019.  She had a severe reaction to Zometa requiring emergency room visit on 01/21/2022.   Today she will see Prolia injection.  Return to clinic every 6 months with labs and Prolia injections and once a year follow-up with me    Orders Placed This Encounter  Procedures   DG Bone Density    Standing Status:   Future    Standing Expiration Date:   07/31/2023    Order Specific Question:   Reason for Exam (SYMPTOM  OR DIAGNOSIS REQUIRED)    Answer:   post menopausal    Order Specific Question:   Preferred imaging location?    Answer:   [facility]    Order Specific Question:   Release to patient    Answer:   Immediate   The patient has a good understanding of  the overall plan. she agrees with it. she will call with any problems that may develop before the next visit here. Total time spent: 30 mins including face to face time and time spent for planning, charting and co-ordination of care   Harriette Ohara, MD 07/31/22    I Gardiner Coins am scribing for Dr. Lindi Adie  I have reviewed the above documentation for accuracy and completeness, and I agree with the above.

## 2022-07-30 ENCOUNTER — Other Ambulatory Visit: Payer: Self-pay | Admitting: *Deleted

## 2022-07-30 DIAGNOSIS — Z170 Estrogen receptor positive status [ER+]: Secondary | ICD-10-CM

## 2022-07-31 ENCOUNTER — Inpatient Hospital Stay: Payer: Medicare HMO

## 2022-07-31 ENCOUNTER — Inpatient Hospital Stay: Payer: Medicare HMO | Attending: Hematology and Oncology

## 2022-07-31 ENCOUNTER — Other Ambulatory Visit: Payer: Self-pay

## 2022-07-31 ENCOUNTER — Inpatient Hospital Stay (HOSPITAL_BASED_OUTPATIENT_CLINIC_OR_DEPARTMENT_OTHER): Payer: Medicare HMO | Admitting: Hematology and Oncology

## 2022-07-31 VITALS — BP 159/70 | HR 63 | Temp 97.9°F | Resp 18 | Ht 67.0 in | Wt 174.8 lb

## 2022-07-31 DIAGNOSIS — M810 Age-related osteoporosis without current pathological fracture: Secondary | ICD-10-CM

## 2022-07-31 DIAGNOSIS — Z7982 Long term (current) use of aspirin: Secondary | ICD-10-CM | POA: Diagnosis not present

## 2022-07-31 DIAGNOSIS — Z79899 Other long term (current) drug therapy: Secondary | ICD-10-CM | POA: Diagnosis not present

## 2022-07-31 DIAGNOSIS — Z170 Estrogen receptor positive status [ER+]: Secondary | ICD-10-CM | POA: Insufficient documentation

## 2022-07-31 DIAGNOSIS — N951 Menopausal and female climacteric states: Secondary | ICD-10-CM | POA: Diagnosis not present

## 2022-07-31 DIAGNOSIS — Z780 Asymptomatic menopausal state: Secondary | ICD-10-CM | POA: Diagnosis not present

## 2022-07-31 DIAGNOSIS — Z79810 Long term (current) use of selective estrogen receptor modulators (SERMs): Secondary | ICD-10-CM | POA: Insufficient documentation

## 2022-07-31 DIAGNOSIS — C50412 Malignant neoplasm of upper-outer quadrant of left female breast: Secondary | ICD-10-CM | POA: Diagnosis not present

## 2022-07-31 LAB — CMP (CANCER CENTER ONLY)
ALT: 39 U/L (ref 0–44)
AST: 40 U/L (ref 15–41)
Albumin: 4.5 g/dL (ref 3.5–5.0)
Alkaline Phosphatase: 62 U/L (ref 38–126)
Anion gap: 5 (ref 5–15)
BUN: 11 mg/dL (ref 8–23)
CO2: 29 mmol/L (ref 22–32)
Calcium: 9.7 mg/dL (ref 8.9–10.3)
Chloride: 106 mmol/L (ref 98–111)
Creatinine: 0.57 mg/dL (ref 0.44–1.00)
GFR, Estimated: >60 mL/min (ref >60)
Glucose, Bld: 111 mg/dL — ABNORMAL HIGH (ref 70–99)
Potassium: 4.4 mmol/L (ref 3.5–5.1)
Sodium: 140 mmol/L (ref 135–145)
Total Bilirubin: 0.5 mg/dL (ref 0.3–1.2)
Total Protein: 7.4 g/dL (ref 6.5–8.1)

## 2022-07-31 LAB — CBC WITH DIFFERENTIAL (CANCER CENTER ONLY)
Abs Immature Granulocytes: 0.02 10*3/uL (ref 0.00–0.07)
Basophils Absolute: 0.1 10*3/uL (ref 0.0–0.1)
Basophils Relative: 1 %
Eosinophils Absolute: 0.1 10*3/uL (ref 0.0–0.5)
Eosinophils Relative: 1 %
HCT: 39.8 % (ref 36.0–46.0)
Hemoglobin: 13 g/dL (ref 12.0–15.0)
Immature Granulocytes: 0 %
Lymphocytes Relative: 38 %
Lymphs Abs: 2.5 10*3/uL (ref 0.7–4.0)
MCH: 30.4 pg (ref 26.0–34.0)
MCHC: 32.7 g/dL (ref 30.0–36.0)
MCV: 93.2 fL (ref 80.0–100.0)
Monocytes Absolute: 0.5 10*3/uL (ref 0.1–1.0)
Monocytes Relative: 7 %
Neutro Abs: 3.5 10*3/uL (ref 1.7–7.7)
Neutrophils Relative %: 53 %
Platelet Count: 240 10*3/uL (ref 150–400)
RBC: 4.27 MIL/uL (ref 3.87–5.11)
RDW: 13.1 % (ref 11.5–15.5)
WBC Count: 6.6 10*3/uL (ref 4.0–10.5)
nRBC: 0 % (ref 0.0–0.2)

## 2022-07-31 MED ORDER — DENOSUMAB 60 MG/ML ~~LOC~~ SOSY
60.0000 mg | PREFILLED_SYRINGE | Freq: Once | SUBCUTANEOUS | Status: AC
  Administered 2022-07-31: 60 mg via SUBCUTANEOUS

## 2022-07-31 NOTE — Assessment & Plan Note (Addendum)
11/04/2021: Screening mammogram detected possible distortion in the left breast initial biopsy was [facility], lumpectomy revealed grade 2 invasive lobular cancer with pleomorphic features measuring 5 mm size, margins negative, ER 100%, PR 95%, Ki-67 2%, HER2 negative (2+ by IHC but FISH negative) 12/17/2021-01/13/2022: Adjuvant radiation   Current treatment: Adjuvant antiestrogen therapy with tamoxifen (because of osteoporosis) started 01/13/2022  Tamoxifen toxicities:  Osteoporosis: T score -2.8 in 2019.  She had a severe reaction to Zometa requiring emergency room visit on 01/21/2022.  Planning to do Prolia  Return to clinic every 6 months with labs and Prolia injections and once a year follow-up with me

## 2022-08-05 ENCOUNTER — Ambulatory Visit
Admission: EM | Admit: 2022-08-05 | Discharge: 2022-08-05 | Disposition: A | Discharge status: Home / Self Care | Payer: Medicare HMO | Attending: Family Medicine | Admitting: Family Medicine

## 2022-08-05 DIAGNOSIS — Z79899 Other long term (current) drug therapy: Secondary | ICD-10-CM | POA: Diagnosis not present

## 2022-08-05 DIAGNOSIS — J069 Acute upper respiratory infection, unspecified: Secondary | ICD-10-CM | POA: Diagnosis not present

## 2022-08-05 DIAGNOSIS — R059 Cough, unspecified: Secondary | ICD-10-CM | POA: Diagnosis not present

## 2022-08-05 DIAGNOSIS — R509 Fever, unspecified: Secondary | ICD-10-CM | POA: Insufficient documentation

## 2022-08-05 DIAGNOSIS — U071 COVID-19: Secondary | ICD-10-CM | POA: Insufficient documentation

## 2022-08-05 LAB — RESP PANEL BY RT-PCR (FLU A&B, COVID) ARPGX2
Influenza A by PCR: NEGATIVE
Influenza B by PCR: NEGATIVE
SARS Coronavirus 2 by RT PCR: POSITIVE — AB

## 2022-08-05 MED ORDER — FLUTICASONE PROPIONATE 50 MCG/ACT NA SUSP: 1.0000 | Freq: Two times a day (BID) | NASAL | 2 refills | Status: DC

## 2022-08-05 MED ORDER — PROMETHAZINE-DM 6.25-15 MG/5ML PO SYRP: 5.0000 mL | ORAL_SOLUTION | Freq: Four times a day (QID) | ORAL | 0 refills | Status: DC | PRN

## 2022-08-05 NOTE — ED Triage Notes (Signed)
Pt reports she got up yesterday running a fever (100.0-102.0), body aches, chills and cough since monday. Her stomach is weak. Some diarrhea and her body aches. Took tylenol for fever.

## 2022-08-05 NOTE — ED Provider Notes (Signed)
[facility]    CSN: [id number] Arrival date & time: 08/05/22  1102      History   Chief Complaint No chief complaint on file.   HPI Laura [name] is a 66 y.o. female.   Presenting today with 1 day history of fever, body aches, chills, cough, diarrhea, body aches, fatigue.  Denies chest pain, shortness of breath, abdominal pain, nausea, vomiting.  So far taking Tylenol, cough medicine with no relief.  Husband sick with similar symptoms.  History of allergies and asthma on as needed medications for both.    Past Medical History:  Diagnosis Date   ABNORMAL ELECTROCARDIOGRAM 08/05/2010   02-19-15 EKG shows NSR. Pt voices no issues with heart related problems.   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   GERD 01/25/2008   uses Prevacid as needed   History of radiation therapy    Left Breast- 12/16/21-01/13/22- Dr. 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   Tendonitis    right wrist-has soft splint in place.   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   Genetic testing 06/24/2022   Malignant neoplasm of upper-outer quadrant of left breast in female, estrogen receptor positive ([place]) 11/21/2021   Allergic rhinitis due to pollen 10/10/2021   Moderate persistent asthma, uncomplicated 41/93/7902   Herniated lumbar disc without myelopathy 03/03/2019   OA (osteoarthritis) of knee 06/24/2015   Preop cardiovascular exam 02/19/2015   UTI (urinary tract infection) 12/16/2013   Osteoporosis 10/30/2013   Menopausal state 05/18/2013   Routine general medical examination at a health care facility 12/02/2011   Impaired glucose tolerance 01/11/2011   Sternal fracture 12/29/2010   Preventative health care 12/26/2010   VENTRICULAR HYPERTROPHY, LEFT 09/23/2010   Essential hypertension 08/05/2010   ABNORMAL ELECTROCARDIOGRAM 08/05/2010   Tibialis tendinitis 05/13/2010   CONSTIPATION 02/21/2008   ESOPHAGEAL STRICTURE 02/17/2008   ACNE, MILD 02/17/2008   GERD 01/25/2008   HYPERCHOLESTEROLEMIA 01/19/2008   Unspecified disorder of liver 01/19/2008   METATARSALGIA 11/08/2007   Cavus deformity of foot, 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    Past Surgical History:  Procedure Laterality Date   ABDOMINAL HYSTERECTOMY  2002   TAH-LTSO-LOA   ARTHROSCOPIC REPAIR ACL Right 10/1998   right knee    BACK SURGERY  2019   BREAST EXCISIONAL BIOPSY Left 2015   benign   BREAST LUMPECTOMY     BREAST SURGERY Left  lumpectomy-benign 2015   CARDIOVASCULAR STRESS TEST  2011   CARPAL TUNNEL RELEASE  2014   left   CHOLECYSTECTOMY  1985   open   COLONOSCOPY     COLONOSCOPY W/ POLYPECTOMY     DILATION AND CURETTAGE OF UTERUS     ELBOW ARTHROPLASTY  2014   left   ESOPHAGOGASTRODUODENOSCOPY  04/01/1998   KNEE ARTHROSCOPY Bilateral    meniscus tear-left.   NASAL SINUS SURGERY     s/p   ORIF ELBOW FRACTURE  12/23/2011   Procedure: OPEN REDUCTION INTERNAL FIXATION (ORIF)  ELBOW/OLECRANON FRACTURE;  Surgeon: Linna Hoff, MD;  Location: [facility];  Service: Orthopedics;  Laterality: Left;  LEFT ELBOW ORIF OF PROXIMAL OLECRANON AND RADIAL HEAD, POSSIBLE ARTHROPLASTY   OVARIAN CYST REMOVAL     RADIOACTIVE SEED GUIDED EXCISIONAL BREAST BIOPSY Left 11/04/2021   Procedure: RADIOACTIVE SEED GUIDED EXCISIONAL LEFT BREAST BIOPSY;  Surgeon: Rolm Bookbinder, MD;  Location: [place];  Service: General;  Laterality: Left;   TOTAL KNEE ARTHROPLASTY Right 06/24/2015   Procedure: RIGHT TOTAL KNEE ARTHROPLASTY;  Surgeon: Gaynelle Arabian, MD;  Location: [facility];  Service: Orthopedics;  Laterality: Right;   TRANSFORAMINAL LUMBAR INTERBODY FUSION (TLIF) WITH PEDICLE SCREW FIXATION 1 LEVEL Left 03/03/2019   Procedure: Left Lumbar four-five Redo Microdiscectomy with transforaminal lumbar interbody fusion;  Surgeon: Erline Levine, MD;  Location: [place];  Service: Neurosurgery;  Laterality: Left;   UPPER GASTROINTESTINAL ENDOSCOPY     US ECHOCARDIOGRAPHY  2011    OB History   No obstetric history on file.      Home Medications    Prior to Admission medications   Medication Sig Start Date End Date Taking? Authorizing Provider  fluticasone (FLONASE) 50 MCG/ACT nasal spray Place 1 spray into both nostrils 2 (two) times daily. 08/05/22  Yes Volney American, PA-C  promethazine-dextromethorphan (PROMETHAZINE-DM) 6.25-15 MG/5ML syrup Take 5 mLs by mouth 4 (four) times daily as needed. 08/05/22  Yes Volney American, PA-C  amLODipine (NORVASC) 5 MG tablet Take 1 tablet (5 mg total) by mouth daily. 01/09/22   Burchette, Alinda Sierras, MD  Aspirin (VAZALORE) 81 MG CAPS Take 81 mg by mouth daily.    [provider]  calcium carbonate (OS-CAL - DOSED IN MG OF ELEMENTAL CALCIUM) 1250 (500 Ca) MG tablet Take 1 tablet by mouth.    [provider]  cyclobenzaprine (FLEXERIL) 10 MG tablet Take 1 tablet (10 mg total) by mouth 3 (three) times daily as needed for  muscle spasms. 06/29/22   Burchette, Alinda Sierras, MD  ketoconazole (NIZORAL) 2 % cream Apply 1 Application topically daily. 04/15/22   Gardenia Phlegm, NP  losartan (COZAAR) 100 MG tablet Take 1 tablet (100 mg total) by mouth daily. 12/08/21   Burchette, Alinda Sierras, MD  Multiple Vitamin (MULTIVITAMIN) tablet Take 1 tablet by mouth daily.    [provider]  tamoxifen (NOLVADEX) 20 MG tablet TAKE 1 TABLET BY MOUTH EVERY DAY 07/22/22   Nicholas Lose, MD  traZODone (DESYREL) 50 MG tablet Take 0.5-1 tablets (25-50 mg total) by mouth at bedtime as needed for sleep. 06/29/22   Burchette, Alinda Sierras, MD  vitamin E 400 UNIT capsule Take 400 Units by mouth daily.    [provider]  olmesartan (BENICAR) 20 MG tablet Take 20 mg by mouth daily.    11/30/11  [provider]    Family History Family History  Problem Relation Age of Onset   Diabetes Mother  Thyroid disease Mother        hypothyroidism   Lung cancer Mother        no smoking hx   Heart disease Father 35       cad   Other Brother        benign brain mass in late 69s   Pancreatic cancer Maternal Aunt        dx and d. late 28s   Cancer Maternal Aunt        unknown GYN; mets; dx and d. 60s   Thyroid cancer Maternal Aunt        mets; dx and d. 53s   Colon cancer Neg Hx    Colon polyps Neg Hx    Esophageal cancer Neg Hx    Stomach cancer Neg Hx    Rectal cancer Neg Hx    Breast cancer Neg Hx     Social History Social History   Tobacco Use   Smoking status: Never   Smokeless tobacco: Never  Vaping Use   Vaping Use: Never used  Substance Use Topics   Alcohol use: No   Drug use: No     Allergies   Zometa [zoledronic acid] and Other   Review of Systems Review of Systems Per HPI  Physical Exam Triage Vital Signs ED Triage Vitals  Enc Vitals Group     BP 08/05/22 1234 128/76     Pulse Rate 08/05/22 1234 87     Resp 08/05/22 1234 20     Temp 08/05/22 1234 98.7 F (37.1 C)     Temp Source  08/05/22 1234 Oral     SpO2 08/05/22 1234 95 %     Weight --      Height --      Head Circumference --      Peak Flow --      Pain Score 08/05/22 1240 0     Pain Loc --      Pain Edu? --      Excl. in [place]? --    No data found.  Updated Vital Signs BP 128/76 (BP Location: Right Arm)   Pulse 87   Temp 98.7 F (37.1 C) (Oral)   Resp 20   SpO2 95%   Visual Acuity Right Eye Distance:   Left Eye Distance:   Bilateral Distance:    Right Eye Near:   Left Eye Near:    Bilateral Near:     Physical Exam Vitals and nursing note reviewed.  Constitutional:      Appearance: Normal appearance.  HENT:     Head: Atraumatic.     Right Ear: Tympanic membrane and external ear normal.     Left Ear: Tympanic membrane and external ear normal.     Nose: Rhinorrhea present.     Mouth/Throat:     Mouth: Mucous membranes are moist.     Pharynx: Posterior oropharyngeal erythema present.  Eyes:     Extraocular Movements: Extraocular movements intact.     Conjunctiva/sclera: Conjunctivae normal.  Cardiovascular:     Rate and Rhythm: Normal rate and regular rhythm.     Heart sounds: Normal heart sounds.  Pulmonary:     Effort: Pulmonary effort is normal.     Breath sounds: Normal breath sounds. No wheezing.  Musculoskeletal:        General: Normal range of motion.     Cervical back: Normal range of motion and neck supple.  Skin:    General: Skin is warm  and dry.  Neurological:     Mental Status: She is alert and oriented to person, place, and time.  Psychiatric:        Mood and Affect: Mood normal.        Thought Content: Thought content normal.      UC Treatments / Results  Labs (all labs ordered are listed, but only abnormal results are displayed) Labs Reviewed  RESP PANEL BY RT-PCR (FLU A&B, COVID) [id number]    EKG   Radiology No results found.  Procedures Procedures (including critical care time)  Medications Ordered in UC Medications - No data to display  Initial  Impression / Assessment and Plan / UC Course  I have reviewed the triage vital signs and the nursing notes.  Pertinent labs & imaging results that were available during my care of the patient were reviewed by me and considered in my medical decision making (see chart for details).     Vital signs and exam overall reassuring today, suggestive of a viral upper respiratory infection.  Respiratory panel pending, treat with Phenergan DM, Flonase, over-the-counter cold and congestion medications.  Return for worsening symptoms.  Final Clinical Impressions(s) / UC Diagnoses   Final diagnoses:  Viral URI with cough  Fever, unspecified   Discharge Instructions   None    ED Prescriptions     Medication Sig Dispense Auth. Provider   promethazine-dextromethorphan (PROMETHAZINE-DM) 6.25-15 MG/5ML syrup Take 5 mLs by mouth 4 (four) times daily as needed. 100 mL Volney American, PA-C   fluticasone [facility]) 50 MCG/ACT nasal spray Place 1 spray into both nostrils 2 (two) times daily. 16 g Volney American, [place]      PDMP not reviewed this encounter.   Volney American, [place] 08/05/22 1717

## 2022-08-07 ENCOUNTER — Encounter: Payer: BC Managed Care – PPO | Admitting: Family Medicine

## 2022-08-07 ENCOUNTER — Telehealth: Payer: Self-pay

## 2022-08-07 ENCOUNTER — Telehealth: Payer: Self-pay | Admitting: Nurse Practitioner

## 2022-08-07 MED ORDER — NIRMATRELVIR/RITONAVIR (PAXLOVID)TABLET: 3.0000 | ORAL_TABLET | Freq: Two times a day (BID) | ORAL | 0 refills | Status: AC

## 2022-08-07 NOTE — Telephone Encounter (Signed)
Paxlovid sent to [facility], [place], pt needs to  hold amlodipine while on medicine, pt verbalized understanding.

## 2022-08-07 NOTE — Telephone Encounter (Signed)
COVID+, start Paxlovid.  Needs to hold amlodipine while on Paxlovid. Last GFR >60.

## 2022-08-10 ENCOUNTER — Encounter: Payer: BC Managed Care – PPO | Admitting: Family Medicine

## 2022-08-10 ENCOUNTER — Telehealth: Payer: Self-pay | Admitting: *Deleted

## 2022-08-10 ENCOUNTER — Telehealth: Payer: Self-pay

## 2022-08-10 NOTE — Telephone Encounter (Signed)
error 

## 2022-08-10 NOTE — Telephone Encounter (Signed)
Returned call to patient regarding Paxlovid. OK to take Paxlovid while on Tamoxifin.

## 2022-08-21 ENCOUNTER — Encounter: Payer: Self-pay | Admitting: Family Medicine

## 2022-08-21 ENCOUNTER — Ambulatory Visit (INDEPENDENT_AMBULATORY_CARE_PROVIDER_SITE_OTHER): Payer: Medicare HMO | Admitting: Family Medicine

## 2022-08-21 VITALS — BP 134/60 | HR 90 | Temp 98.0°F | Ht 67.0 in | Wt 174.7 lb

## 2022-08-21 DIAGNOSIS — J329 Chronic sinusitis, unspecified: Secondary | ICD-10-CM | POA: Diagnosis not present

## 2022-08-21 MED ORDER — AMOXICILLIN-POT CLAVULANATE 875-125 MG PO TABS: 1.0000 | ORAL_TABLET | Freq: Two times a day (BID) | ORAL | 0 refills | Status: DC

## 2022-08-21 NOTE — Progress Notes (Signed)
Established Patient Office Visit  Subjective   Patient ID: Laura [name], female    DOB: 05/29/1956  Age: 66 y.o. MRN: [id number]  Chief Complaint  Patient presents with   Nasal Congestion    Patient complains of nasal congestion, x2 weeks    Cough    Patient complains of cough, x2 weeks,Tried    Sinusitis    X2 weeks     HPI   Laura [name] is seen with persistent sinusitis symptoms past few weeks.  She states that she and her husband were both diagnosed with COVID on 21 November.  On Wednesday the next day they went to urgent care and were diagnosed formally.  She ended up having in addition to respiratory symptoms of GI symptoms such as diarrhea.  Overall some improvement since from [place] but she is having recurrent sinus congestion and increased facial pain especially maxillary sinuses and also frequent early morning headaches.  She states the symptoms are very typical of previous sinus infections.  No fever.  Past Medical History:  Diagnosis Date   ABNORMAL ELECTROCARDIOGRAM 08/05/2010   02-19-15 EKG shows NSR. Pt voices no issues with heart related problems.   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   GERD 01/25/2008   uses Prevacid as needed   History of radiation therapy    Left Breast- 12/16/21-01/13/22- Dr. 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   Tendonitis    right wrist-has soft splint in place.   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   VENTRICULAR HYPERTROPHY, LEFT 09/23/2010   Wears glasses    Past Surgical History:  Procedure Laterality Date   ABDOMINAL HYSTERECTOMY  2002   TAH-LTSO-LOA   ARTHROSCOPIC REPAIR ACL Right 10/1998   right knee    BACK SURGERY  2019   BREAST EXCISIONAL BIOPSY Left 2015   benign   BREAST LUMPECTOMY     BREAST SURGERY Left    lumpectomy-benign 2015   CARDIOVASCULAR STRESS TEST  2011   CARPAL TUNNEL RELEASE  2014   left   CHOLECYSTECTOMY  1985   open   COLONOSCOPY     COLONOSCOPY W/ POLYPECTOMY     DILATION AND CURETTAGE OF UTERUS     ELBOW ARTHROPLASTY  2014   left   ESOPHAGOGASTRODUODENOSCOPY  04/01/1998   KNEE ARTHROSCOPY Bilateral    meniscus tear-left.   NASAL SINUS SURGERY     s/p   ORIF ELBOW FRACTURE  12/23/2011   Procedure: OPEN REDUCTION INTERNAL FIXATION (ORIF) ELBOW/OLECRANON FRACTURE;  Surgeon: Linna Hoff, MD;  Location: [place];  Service: Orthopedics;  Laterality:  Left;  LEFT ELBOW ORIF OF PROXIMAL OLECRANON AND RADIAL HEAD, POSSIBLE ARTHROPLASTY   OVARIAN CYST REMOVAL     RADIOACTIVE SEED GUIDED EXCISIONAL BREAST BIOPSY Left 11/04/2021   Procedure: RADIOACTIVE SEED GUIDED EXCISIONAL LEFT BREAST BIOPSY;  Surgeon: Rolm Bookbinder, MD;  Location: [place];  Service: General;  Laterality: Left;   TOTAL KNEE ARTHROPLASTY Right 06/24/2015   Procedure: RIGHT TOTAL KNEE ARTHROPLASTY;  Surgeon: Gaynelle Arabian, MD;  [facility];  Service: Orthopedics;  Laterality: Right;   TRANSFORAMINAL LUMBAR INTERBODY FUSION (TLIF) WITH PEDICLE SCREW FIXATION 1 LEVEL Left 03/03/2019   Procedure: Left Lumbar four-five Redo Microdiscectomy with  transforaminal lumbar interbody fusion;  Surgeon: Erline Levine, MD;  Location: [place];  Service: Neurosurgery;  Laterality: Left;   UPPER GASTROINTESTINAL ENDOSCOPY     US ECHOCARDIOGRAPHY  2011    reports that she has never smoked. She has never used smokeless tobacco. She reports that she does not drink alcohol and does not use drugs. family history includes Cancer in her maternal aunt; Diabetes in her mother; Heart disease (age of onset: 31) in her father; Lung cancer in her mother; Other in her brother; Pancreatic cancer in her maternal aunt; Thyroid cancer in her maternal aunt; Thyroid disease in her mother. Allergies  Allergen Reactions   Zometa [Zoledronic Acid] Other (See Comments)    Fever, chills, required ED visit   Other Rash    dermabond    Review of Systems  Constitutional:  Positive for weight loss. Negative for chills and fever.  HENT:  Positive for congestion and sinus pain.   Respiratory:  Negative for cough.   Cardiovascular:  Negative for chest pain.  Neurological:  Positive for headaches.      Objective:     BP 134/60 (BP Location: Left Arm, Patient Position: Sitting, Cuff Size: Normal)   Pulse 90   Temp 98 F (36.7 C) (Oral)   Ht _0  (1.702 m)   Wt 174 lb 11.2 oz (79.2 kg)   SpO2 98%   BMI 27.36 kg/m    Physical Exam Vitals reviewed.  Constitutional:      General: She is not in acute distress.    Appearance: She is not toxic-appearing.  HENT:     Right Ear: Tympanic membrane normal.     Left Ear: Tympanic membrane normal.  Cardiovascular:     Rate and Rhythm: Normal rate and regular rhythm.  Pulmonary:     Effort: Pulmonary effort is normal.     Breath sounds: Normal breath sounds. No wheezing or rales.  Musculoskeletal:     Cervical back: Neck supple.  Lymphadenopathy:     Cervical: No cervical adenopathy.  Neurological:     Mental Status: She is alert.      No results found for any visits on 08/21/22.    The 10-year ASCVD risk  score (Arnett DK, et al., 2019) is: 7.7%    Assessment & Plan:   Persistent sinusitis symptoms following COVID infection.  She has been battling with some sinus symptoms now for a few weeks.  -Stay well-hydrated -Continue saline nasal irrigation with Nettie pot -Start Augmentin 875 mg twice daily for 10 days -Follow-up for any persistent or worsening symptoms  No follow-ups on file.    Carolann Littler, MD

## 2022-08-31 DIAGNOSIS — Z01411 Encounter for gynecological examination (general) (routine) with abnormal findings: Secondary | ICD-10-CM | POA: Diagnosis not present

## 2022-08-31 DIAGNOSIS — Z90711 Acquired absence of uterus with remaining cervical stump: Secondary | ICD-10-CM | POA: Diagnosis not present

## 2022-08-31 DIAGNOSIS — Z124 Encounter for screening for malignant neoplasm of cervix: Secondary | ICD-10-CM | POA: Diagnosis not present

## 2022-08-31 DIAGNOSIS — Z01419 Encounter for gynecological examination (general) (routine) without abnormal findings: Secondary | ICD-10-CM | POA: Diagnosis not present

## 2022-08-31 DIAGNOSIS — Z6828 Body mass index (BMI) 28.0-28.9, adult: Secondary | ICD-10-CM | POA: Diagnosis not present

## 2022-08-31 DIAGNOSIS — I10 Essential (primary) hypertension: Secondary | ICD-10-CM | POA: Diagnosis not present

## 2022-09-11 ENCOUNTER — Encounter: Payer: Self-pay | Admitting: Family Medicine

## 2022-09-11 ENCOUNTER — Ambulatory Visit (INDEPENDENT_AMBULATORY_CARE_PROVIDER_SITE_OTHER): Payer: Medicare HMO | Admitting: Family Medicine

## 2022-09-11 ENCOUNTER — Telehealth: Payer: Self-pay | Admitting: Family Medicine

## 2022-09-11 VITALS — BP 126/80 | HR 88 | Temp 98.0°F | Ht 67.0 in | Wt 174.1 lb

## 2022-09-11 DIAGNOSIS — Z0000 Encounter for general adult medical examination without abnormal findings: Secondary | ICD-10-CM

## 2022-09-11 DIAGNOSIS — Z23 Encounter for immunization: Secondary | ICD-10-CM | POA: Diagnosis not present

## 2022-09-11 DIAGNOSIS — R7303 Prediabetes: Secondary | ICD-10-CM

## 2022-09-11 DIAGNOSIS — E785 Hyperlipidemia, unspecified: Secondary | ICD-10-CM

## 2022-09-11 LAB — LDL CHOLESTEROL, DIRECT: Direct LDL: 54 mg/dL

## 2022-09-11 LAB — LIPID PANEL
Cholesterol: 141 mg/dL (ref 0–200)
HDL: 37.8 mg/dL — ABNORMAL LOW (ref >39.00)
NonHDL: 103.33
Total CHOL/HDL Ratio: 4
Triglycerides: 303 mg/dL — ABNORMAL HIGH (ref 0.0–149.0)
VLDL: 60.6 mg/dL — ABNORMAL HIGH (ref 0.0–40.0)

## 2022-09-11 LAB — HEMOGLOBIN A1C: Hgb A1c MFr Bld: 6 % (ref 4.6–6.5)

## 2022-09-11 NOTE — Progress Notes (Signed)
Established Patient Office Visit  Subjective   Patient ID: Laura [name], female    DOB: 02/17/1956  Age: 66 y.o. MRN: [id number]  Chief Complaint  Patient presents with   Annual Exam    HPI   Laura [name] is seen for physical exam.  She has history of hypertension, asthma, GERD, prediabetes, history of breast cancer, osteoporosis.  She has had very stressful past year.  Her mother has lung cancer but is currently fairly stable.  Generally she feels well.  She still sees GYN.  She has had hysterectomy for benign disease and does not get further Pap smears.  Health maintenance reviewed:  -Still needs flu vaccine -No history of pneumonia vaccine -Tetanus up-to-date -Colonoscopy due 8/29 -Mammogram due 11/25 -No history of Shingrix  Family history-mother is 80 years old and has history of 2 diabetes.  Her mother was diagnosed with non-small cell lung cancer this past year.  Father is deceased.  He had heart disease late 9s.  She has a brother who is a smoker but has no active medical problems.   Social history-married.  1 son.  He is not married.  No grandchildren.  She retired 3 years ago.  Never smoked.  No alcohol.  Husband also retired.  Past Medical History:  Diagnosis Date   ABNORMAL ELECTROCARDIOGRAM 08/05/2010   02-19-15 EKG shows NSR. Pt voices no issues with heart related problems.   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   GERD 01/25/2008   uses Prevacid as needed   History of radiation therapy    Left Breast- 12/16/21-01/13/22- Dr. 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   Tendonitis    right wrist-has soft splint in place.   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   VENTRICULAR HYPERTROPHY, LEFT 09/23/2010   Wears glasses    Past Surgical History:  Procedure Laterality Date   ABDOMINAL HYSTERECTOMY  2002   TAH-LTSO-LOA   ARTHROSCOPIC REPAIR ACL Right 10/1998   right knee    BACK SURGERY  2019   BREAST EXCISIONAL BIOPSY Left 2015   benign   BREAST LUMPECTOMY     BREAST SURGERY Left    lumpectomy-benign 2015   CARDIOVASCULAR STRESS TEST  2011   CARPAL TUNNEL RELEASE  2014   left   CHOLECYSTECTOMY  1985   open   COLONOSCOPY     COLONOSCOPY W/ POLYPECTOMY     DILATION AND CURETTAGE OF UTERUS     ELBOW ARTHROPLASTY  2014   left   ESOPHAGOGASTRODUODENOSCOPY  04/01/1998   KNEE ARTHROSCOPY Bilateral    meniscus tear-left.  NASAL SINUS SURGERY     s/p   ORIF ELBOW FRACTURE  12/23/2011   Procedure: OPEN REDUCTION INTERNAL FIXATION (ORIF) ELBOW/OLECRANON FRACTURE;  Surgeon: Linna Hoff, MD;  Location: [place];  Service: Orthopedics;  Laterality: Left;  LEFT ELBOW ORIF OF PROXIMAL OLECRANON AND RADIAL HEAD, POSSIBLE ARTHROPLASTY   OVARIAN CYST REMOVAL     RADIOACTIVE SEED GUIDED EXCISIONAL BREAST BIOPSY Left 11/04/2021   Procedure: RADIOACTIVE SEED GUIDED EXCISIONAL LEFT BREAST BIOPSY;  Surgeon: Rolm Bookbinder, MD;  Location: [place];  Service: General;  Laterality: Left;   TOTAL KNEE ARTHROPLASTY Right 06/24/2015   Procedure: RIGHT TOTAL KNEE ARTHROPLASTY;  Surgeon: Gaynelle Arabian, MD;   Location: [facility];  Service: Orthopedics;  Laterality: Right;   TRANSFORAMINAL LUMBAR INTERBODY FUSION (TLIF) WITH PEDICLE SCREW FIXATION 1 LEVEL Left 03/03/2019   Procedure: Left Lumbar four-five Redo Microdiscectomy with transforaminal lumbar interbody fusion;  Surgeon: Erline Levine, MD;  Location: [place];  Service: Neurosurgery;  Laterality: Left;   UPPER GASTROINTESTINAL ENDOSCOPY     US ECHOCARDIOGRAPHY  2011    reports that she has never smoked. She has never used smokeless tobacco. She reports that she does not drink alcohol and does not use drugs. family history includes Cancer in her maternal aunt; Diabetes in her mother; Heart disease (age of onset: 59) in her father; Lung cancer in her mother; Other in her brother; Pancreatic cancer in her maternal aunt; Thyroid cancer in her maternal aunt; Thyroid disease in her mother. Allergies  Allergen Reactions   Zometa [Zoledronic Acid] Other (See Comments)    Fever, chills, required ED visit   Other Rash    dermabond    Review of Systems  Constitutional:  Negative for chills, fever, malaise/fatigue and weight loss.  HENT:  Negative for hearing loss.   Eyes:  Negative for blurred vision and double vision.  Respiratory:  Negative for cough and shortness of breath.   Cardiovascular:  Negative for chest pain, palpitations and leg swelling.  Gastrointestinal:  Negative for abdominal pain, blood in stool, constipation and diarrhea.  Genitourinary:  Negative for dysuria.  Skin:  Negative for rash.  Neurological:  Negative for dizziness, speech change, seizures, loss of consciousness and headaches.  Psychiatric/Behavioral:  Negative for depression.       Objective:     BP 126/80   Pulse 88   Temp 98 F (36.7 C) (Oral)   Ht _0  (1.702 m)   Wt 174 lb 1 oz (79 kg)   SpO2 98%   BMI 27.26 kg/m    Physical Exam Vitals reviewed.  Constitutional:      Appearance: She is well-developed.  HENT:     Head: Normocephalic and  atraumatic.  Eyes:     Pupils: Pupils are equal, round, and reactive to light.  Neck:     Thyroid: No thyromegaly.  Cardiovascular:     Rate and Rhythm: Normal rate and regular rhythm.     Heart sounds: Normal heart sounds. No murmur heard. Pulmonary:     Effort: No respiratory distress.     Breath sounds: Normal breath sounds. No wheezing or rales.  Abdominal:     General: Bowel sounds are normal. There is no distension.     Palpations: Abdomen is soft. There is no mass.     Tenderness: There is no abdominal tenderness. There is no guarding or rebound.  Musculoskeletal:        General: Normal range of motion.  Cervical back: Normal range of motion and neck supple.  Lymphadenopathy:     Cervical: No cervical adenopathy.  Skin:    Findings: No rash.  Neurological:     Mental Status: She is alert and oriented to person, place, and time.     Cranial Nerves: No cranial nerve deficit.     Deep Tendon Reflexes: Reflexes normal.  Psychiatric:        Behavior: Behavior normal.        Thought Content: Thought content normal.        Judgment: Judgment normal.      No results found for any visits on 09/11/22.    The 10-year ASCVD risk score (Arnett DK, et al., 2019) is: 6.9%    Assessment & Plan:   Problem List Items Addressed This Visit   None Visit Diagnoses     Physical exam    -  Primary   Prediabetes       Relevant Orders   Hemoglobin A1c   Dyslipidemia       Relevant Orders   Lipid panel     -Check A1c with her prior history of prediabetes and family history of type 2 diabetes -Continue low glycemic diet -Check lipid panel -Try to establish more consistent exercise -Continue yearly mammogram -Flu vaccine and Prevnar 20 given -Consider Shingrix at some point this year    No follow-ups on file.    Carolann Littler, MD

## 2022-09-11 NOTE — Addendum Note (Signed)
Addended by: Rodrigo Ran on: 09/11/2022 08:43 AM   Modules accepted: Orders

## 2022-09-11 NOTE — Telephone Encounter (Signed)
Pt came in today with her spouse.  Pt would like to know why her husband already received his lab results via [place], and she has not received her results yet?  Pt is asking if her results could be posted before we leave for the day, because she knows we are closed on Monday.

## 2022-09-11 NOTE — Patient Instructions (Signed)
Consider Shingrix at some point this year.   We gave influenza and Prevnar 20 vaccine.

## 2022-09-15 NOTE — Telephone Encounter (Signed)
Please see result note 

## 2022-10-19 ENCOUNTER — Other Ambulatory Visit: Payer: Self-pay | Admitting: Family Medicine

## 2022-10-22 ENCOUNTER — Telehealth: Payer: Self-pay | Admitting: Family Medicine

## 2022-10-22 NOTE — Telephone Encounter (Signed)
Spoke with patient to schedule Medicare Annual Wellness Visit (AWV) either virtually or in office.   Patient declined stating she sees her provider for wellness.  Please do not call back    awvi 10/15/22 per [facility] please schedule with Nurse Health Adviser   45 min for awv-i  in office appointments 30 min for awv-s & awv-i phone/virtual appointments

## 2022-10-31 ENCOUNTER — Other Ambulatory Visit: Payer: Self-pay | Admitting: Family Medicine

## 2022-11-02 NOTE — Telephone Encounter (Signed)
Requested Prescriptions  Refused Prescriptions Disp Refills   fluticasone (FLONASE) 50 MCG/ACT nasal spray [Pharmacy Med Name: FLUTICASONE PROP 50 MCG SPRAY] 48 mL     Sig: PLACE 1 SPRAY INTO BOTH NOSTRILS 2 (TWO) TIMES DAILY     There is no refill protocol information for this order

## 2022-11-11 ENCOUNTER — Encounter: Payer: Self-pay | Admitting: Internal Medicine

## 2022-11-11 ENCOUNTER — Ambulatory Visit (INDEPENDENT_AMBULATORY_CARE_PROVIDER_SITE_OTHER): Payer: Medicare HMO | Admitting: Internal Medicine

## 2022-11-11 VITALS — BP 138/79 | HR 103 | Temp 98.1°F | Ht 67.0 in | Wt 174.6 lb

## 2022-11-11 DIAGNOSIS — J029 Acute pharyngitis, unspecified: Secondary | ICD-10-CM | POA: Diagnosis not present

## 2022-11-11 DIAGNOSIS — R6889 Other general symptoms and signs: Secondary | ICD-10-CM | POA: Insufficient documentation

## 2022-11-11 DIAGNOSIS — R0981 Nasal congestion: Secondary | ICD-10-CM

## 2022-11-11 DIAGNOSIS — R051 Acute cough: Secondary | ICD-10-CM

## 2022-11-11 LAB — POCT INFLUENZA A/B
Influenza A, POC: NEGATIVE
Influenza B, POC: NEGATIVE

## 2022-11-11 LAB — POCT RAPID STREP A (OFFICE): Rapid Strep A Screen: NEGATIVE

## 2022-11-11 LAB — POC COVID19 BINAXNOW: SARS Coronavirus 2 Ag: NEGATIVE

## 2022-11-11 MED ORDER — PREDNISONE 20 MG PO TABS: ORAL_TABLET | ORAL | 0 refills | Status: DC

## 2022-11-11 MED ORDER — PROMETHAZINE-DM 6.25-15 MG/5ML PO SYRP: 5.0000 mL | ORAL_SOLUTION | Freq: Every evening | ORAL | 0 refills | Status: DC | PRN

## 2022-11-11 NOTE — Progress Notes (Signed)
Laura [name] [facility]: [id number]   Routine Medical Office Visit  Patient:  Laura [name]      Age: 67 y.o.       Sex:  female  Date:   11/11/2022  PCP:    Eulas Post, MD   [place] Provider: Loralee Pacas, MD   Problem Focused Charting:   Medical Decision Making per Assessment/Plan   Laura [name] was seen today for dry cough, nasal congestion, fever, sore throat and generalized body aches.  Flu-like symptoms -     POCT Influenza A/B -     predniSONE; Take 2 pills for 3 days, 1 pill for 4 days  Dispense: 10 tablet; Refill: 0 -     Promethazine-DM; Take 5 mLs by mouth at bedtime as needed for cough.  Dispense: 120 mL; Refill: 0  Acute cough -     POC COVID-19 BinaxNow  Nasal congestion -     POC COVID-19 BinaxNow  Sore throat -     POCT rapid strep A   Looks like RSV/flu/COVID.Marland Kitchen. but tests negative - but responded to prednisone/promethazine diabetes mellitus in husband.  Also advised patient to use saline nasal misting spray (e.g. SimplySaline) and stay hydrated- she has liquid IV and will get popsicles.     Subjective - Clinical Presentation:   Laura [name] is a 67 y.o. 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/24/2022   Genetic testing 06/24/2022   Malignant neoplasm of upper-outer quadrant of left breast in female, estrogen receptor positive ([place]) 11/21/2021   Allergic rhinitis due to pollen 10/10/2021   Moderate persistent asthma, uncomplicated [id number]   Herniated lumbar disc without myelopathy 03/03/2019   OA (osteoarthritis) of knee 06/24/2015   Preop cardiovascular exam 02/19/2015   UTI (urinary tract infection) 12/16/2013   Osteoporosis 10/30/2013   Menopausal state 05/18/2013   Routine general medical examination at a health care facility 12/02/2011   Impaired glucose tolerance 01/11/2011   Sternal fracture  12/29/2010   Preventative health care 12/26/2010   VENTRICULAR HYPERTROPHY, LEFT 09/23/2010   Essential hypertension 08/05/2010   ABNORMAL ELECTROCARDIOGRAM 08/05/2010   Tibialis tendinitis 05/13/2010   CONSTIPATION 02/21/2008   ESOPHAGEAL STRICTURE 02/17/2008   ACNE, MILD 02/17/2008   GERD 01/25/2008   HYPERCHOLESTEROLEMIA 01/19/2008   Unspecified disorder of liver 01/19/2008   METATARSALGIA 11/08/2007   Cavus deformity of foot, 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   Past Medical History:  Diagnosis Date   ABNORMAL ELECTROCARDIOGRAM 08/05/2010   02-19-15 EKG shows NSR. Pt voices no issues with heart related problems.   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   GERD 01/25/2008   uses Prevacid as needed   History of radiation therapy    Left Breast- 12/16/21-01/13/22- Dr. 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   Tendonitis    right wrist-has soft splint in place.   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   VENTRICULAR HYPERTROPHY, LEFT 09/23/2010   Wears glasses     Outpatient Medications Prior to Visit  Medication Sig   amLODipine (NORVASC) 5 MG tablet  Take 1 tablet (5 mg total) by mouth daily.   Aspirin (VAZALORE) 81 MG CAPS Take 81 mg by mouth daily.   calcium carbonate (OS-CAL - DOSED IN MG OF ELEMENTAL CALCIUM) 1250 (500 Ca) MG tablet Take 1 tablet by mouth.   cyclobenzaprine (FLEXERIL) 10 MG tablet Take 1 tablet (10 mg total) by mouth 3 (three) times daily as needed for muscle spasms.   fluticasone (FLONASE) 50 MCG/ACT nasal spray Place 1 spray into both nostrils 2 (two) times daily.   ketoconazole (NIZORAL) 2 % cream Apply 1 Application topically daily.   losartan (COZAAR) 100 MG tablet Take 1 tablet (100 mg total) by mouth daily.   Multiple Vitamin (MULTIVITAMIN) tablet Take 1 tablet by mouth daily.   tamoxifen (NOLVADEX) 20 MG tablet TAKE 1 TABLET BY MOUTH EVERY DAY   traZODone (DESYREL) 50 MG tablet TAKE 0.5-1 TABLETS BY MOUTH AT BEDTIME AS NEEDED FOR SLEEP.   vitamin E 400 UNIT capsule Take 400 Units by mouth daily.   No facility-administered medications prior to visit.    Chief Complaint  Patient presents with   Dry cough    Symptoms started on Monday.   Nasal Congestion   Fever    Was 100.4 today. Has taken Tylenol.   Sore Throat   Generalized Body Aches    HPI  Symptom(s) started 2 days ago Husband had bronchitis where they couldn't COVID or flu or RSV on testing Cough syrup and prednisone was helpful.          Objective:  Physical Exam  BP 138/79 (BP Location: Right Arm, Patient Position: Sitting)   Pulse (!) 103   Temp 98.1 F (36.7 C) (Temporal)   Ht '5\' 7"'$  (1.702 m)   Wt 174 lb 9.6 oz (79.2 kg)   SpO2 96%   BMI 27.35 kg/m   Overweight  by BMI criteria but truncal adiposity (waist circumference or caliper) should be used instead. Wt Readings from Last 10 Encounters:  11/11/22 174 lb 9.6 oz (79.2 kg)  09/11/22 174 lb 1 oz (79 kg)  08/21/22 174 lb 11.2 oz (79.2 kg)  07/31/22 174 lb 12.8 oz (79.3 kg)  06/29/22 174 lb 4.8 oz (79.1 kg)  04/15/22 177 lb 1.6 oz (80.3 kg)  02/20/22 176 lb 11.2 oz (80.2  kg)  02/16/22 179 lb 12.8 oz (81.6 kg)  01/21/22 178 lb 9 oz (81 kg)  01/13/22 181 lb 14.4 oz (82.5 kg)   Vital signs reviewed.  Nursing notes reviewed. Weight trend reviewed. General Appearance:  Well developed, well nourished female in no acute distress.   Normal work of breathing at rest Musculoskeletal: All extremities are intact.  Neurological:  Awake, alert,  No obvious focal neurological deficits or cognitive impairments Psychiatric:  Appropriate mood, pleasant demeanor Problem-specific findings:  clear to auscultation bilaterally, stiff heart sounds suspect(s) secondary to radiation, sinuse erythema without pus, cannot see oropharynx  D/t mallampati, ears/tympanic membrane clear  Results Reviewed: Results for orders placed or performed in visit on 11/11/22  POC COVID-19  Result Value Ref Range   SARS Coronavirus 2 Ag Negative Negative  POCT Influenza A/B  Result Value Ref Range   Influenza A, POC Negative Negative   Influenza B, POC Negative Negative  POCT rapid strep A  Result Value Ref Range   Rapid Strep A Screen Negative Negative    Recent Results (from the past 2160 hour(s))  Lipid panel     Status: Abnormal   Collection Time: 09/11/22  8:31 AM  Result Value Ref Range   Cholesterol 141 0 - 200 mg/dL    Comment: ATP III Classification       Desirable:  < 200 mg/dL               Borderline High:  200 - 239 mg/dL          High:  > = 240 mg/dL   Triglycerides 303.0 (H) 0.0 - 149.0 mg/dL    Comment: Normal:  <150 mg/dLBorderline High:  150 - 199 mg/dL   HDL 37.80 (L) >39.00 mg/dL   VLDL 60.6 (H) 0.0 - 40.0 mg/dL   Total CHOL/HDL Ratio 4     Comment:                Men          Women1/2 Average Risk     3.4          3.3Average Risk          5.0          4.42X Average Risk          9.6          7.13X Average Risk          15.0          11.0                       NonHDL 103.33     Comment: NOTE:  Non-HDL goal should be 30 mg/dL higher than patient's LDL goal (i.e. LDL  goal of < 70 mg/dL, would have non-HDL goal of < 100 mg/dL)  Hemoglobin A1c     Status: None   Collection Time: 09/11/22  8:31 AM  Result Value Ref Range   Hgb A1c MFr Bld 6.0 4.6 - 6.5 %    Comment: Glycemic Control Guidelines for People with Diabetes:Non Diabetic:  <6%Goal of Therapy: <7%Additional Action Suggested:  >8%   LDL cholesterol, direct     Status: None   Collection Time: 09/11/22  8:31 AM  Result Value Ref Range   Direct LDL 54.0 mg/dL    Comment: Optimal:  <100 mg/dLNear or Above Optimal:  100-129 mg/dLBorderline High:  130-159 mg/dLHigh:  160-189 mg/dLVery High:  >190 mg/dL  POCT rapid strep A     Status: Normal   Collection Time: 11/11/22  5:00 PM  Result Value Ref Range   Rapid Strep A Screen Negative Negative  POCT Influenza A/B     Status: Normal   Collection Time: 11/11/22  5:00 PM  Result Value Ref Range   Influenza A, POC Negative Negative   Influenza B, POC Negative Negative  POC COVID-19     Status: Normal   Collection Time: 11/11/22  5:01 PM  Result Value Ref Range   SARS Coronavirus 2 Ag Negative Negative          Signed: Loralee Pacas,  MD 11/11/2022 5:20 PM

## 2022-11-13 ENCOUNTER — Other Ambulatory Visit: Payer: Self-pay | Admitting: Family Medicine

## 2022-11-16 ENCOUNTER — Encounter: Payer: Self-pay | Admitting: Family Medicine

## 2022-11-16 ENCOUNTER — Ambulatory Visit (INDEPENDENT_AMBULATORY_CARE_PROVIDER_SITE_OTHER): Payer: Medicare HMO | Admitting: Family Medicine

## 2022-11-16 VITALS — BP 156/80 | HR 85 | Temp 98.0°F | Ht 67.0 in | Wt 171.4 lb

## 2022-11-16 DIAGNOSIS — J209 Acute bronchitis, unspecified: Secondary | ICD-10-CM | POA: Diagnosis not present

## 2022-11-16 DIAGNOSIS — R062 Wheezing: Secondary | ICD-10-CM

## 2022-11-16 MED ORDER — HYDROCOD POLI-CHLORPHE POLI ER 10-8 MG/5ML PO SUER: 5.0000 mL | Freq: Two times a day (BID) | ORAL | 0 refills | Status: DC | PRN

## 2022-11-16 MED ORDER — METHYLPREDNISOLONE ACETATE 80 MG/ML IJ SUSP
80.0000 mg | Freq: Once | INTRAMUSCULAR | Status: AC
  Administered 2022-11-16: 80 mg via INTRAMUSCULAR

## 2022-11-16 NOTE — Addendum Note (Signed)
Addended by: Nilda Riggs on: 11/16/2022 10:40 AM   Modules accepted: Orders

## 2022-11-16 NOTE — Progress Notes (Signed)
Established Patient Office Visit  Subjective   Patient ID: Laura [name], female    DOB: 11/27/1955  Age: 67 y.o. MRN: [id number]  Chief Complaint  Patient presents with   Cough    Patient complains of cough, Non productive, x1 week, Tried Prednisone and Cough syrup with no relief     HPI   Laura [name] is seen with some persistent cough.  She states couple weeks ago her husband developed typical and similar upper respiratory symptoms.  Diabetes symptoms started 1 week ago.  She noticed some nasal congestion and by Tuesday low-grade fever and this had resolved by Wednesday.  She was seen last week at another clinic and had rapid strep, flu testing, COVID testing which were all negative.  She was started on prednisone because of her history of asthma and given Promethazine DM cough syrup.  He is having severe cough at night.  Mostly dry.  Feels like she has some wheezing still.  She does have rescue inhaler.  No respiratory distress.  No further fever.  Cough has been very disruptive to sleep.  Past Medical History:  Diagnosis Date   ABNORMAL ELECTROCARDIOGRAM 08/05/2010   02-19-15 EKG shows NSR. Pt voices no issues with heart related problems.   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   GERD 01/25/2008   uses Prevacid as needed   History of radiation therapy    Left Breast- 12/16/21-01/13/22- Dr. 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   Tendonitis    right wrist-has soft splint in place.   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   VENTRICULAR HYPERTROPHY, LEFT 09/23/2010   Wears glasses    Past Surgical History:  Procedure Laterality Date   ABDOMINAL HYSTERECTOMY  2002   TAH-LTSO-LOA   ARTHROSCOPIC REPAIR ACL Right 10/1998   right knee    BACK SURGERY  2019   BREAST EXCISIONAL BIOPSY Left 2015   benign   BREAST LUMPECTOMY     BREAST SURGERY Left    lumpectomy-benign 2015   CARDIOVASCULAR STRESS TEST  2011   CARPAL TUNNEL RELEASE  2014   left   CHOLECYSTECTOMY  1985   open   COLONOSCOPY     COLONOSCOPY W/ POLYPECTOMY     DILATION AND CURETTAGE OF UTERUS     ELBOW ARTHROPLASTY  2014   left   ESOPHAGOGASTRODUODENOSCOPY  04/01/1998   KNEE ARTHROSCOPY Bilateral    meniscus tear-left.   NASAL SINUS SURGERY     s/p   ORIF ELBOW FRACTURE  12/23/2011   Procedure: OPEN REDUCTION INTERNAL FIXATION (ORIF) ELBOW/OLECRANON FRACTURE;  Surgeon: Linna Hoff, MD;  Location: [place];  Service: Orthopedics;  Laterality: Left;  LEFT ELBOW ORIF OF PROXIMAL OLECRANON AND RADIAL HEAD, POSSIBLE ARTHROPLASTY   OVARIAN CYST REMOVAL     RADIOACTIVE SEED GUIDED EXCISIONAL BREAST BIOPSY Left 11/04/2021   Procedure: RADIOACTIVE SEED GUIDED EXCISIONAL LEFT BREAST BIOPSY;  Surgeon: Rolm Bookbinder, MD;  Location: [place];  Service: General;  Laterality: Left;   TOTAL KNEE ARTHROPLASTY Right 06/24/2015   Procedure: RIGHT TOTAL KNEE ARTHROPLASTY;  Surgeon: Gaynelle Arabian, MD;  Location: [facility];  Service: Orthopedics;  Laterality: Right;   TRANSFORAMINAL LUMBAR INTERBODY FUSION (TLIF) WITH PEDICLE SCREW FIXATION 1 LEVEL Left  03/03/2019   Procedure: Left Lumbar four-five Redo Microdiscectomy with transforaminal lumbar interbody fusion;  Surgeon: Erline Levine, MD;  Location: [place];  Service: Neurosurgery;  Laterality: Left;   UPPER GASTROINTESTINAL ENDOSCOPY     US ECHOCARDIOGRAPHY  2011    reports that she has never smoked. She has never used smokeless tobacco. She reports that she does not drink alcohol and does not use drugs. family history includes Cancer in her maternal aunt; Diabetes in her mother; Heart disease (age of onset: 78) in her father; Lung cancer in her mother; Other in her brother; Pancreatic cancer in her maternal aunt; Thyroid cancer in her maternal aunt; Thyroid disease in her mother. Allergies  Allergen Reactions   Zometa [Zoledronic Acid] Other (See Comments)    Fever, chills, required ED visit   Other Rash    dermabond    Review of Systems  Constitutional:  Negative for chills and fever.  HENT:  Negative for sinus pain.   Respiratory:  Positive for cough and wheezing. Negative for hemoptysis.   Cardiovascular:  Negative for chest pain.      Objective:     BP (!) 156/80 (BP Location: Right Arm, Patient Position: Sitting, Cuff Size: Normal)   Pulse 85   Temp 98 F (36.7 C) (Oral)   Ht '5\' 7"'$  (1.702 m)   Wt 171 lb 6.4 oz (77.7 kg)   SpO2 98%   BMI 26.85 kg/m    Physical Exam Vitals reviewed.  Constitutional:      General: She is not in acute distress.    Appearance: Normal appearance. She is not ill-appearing.  Cardiovascular:     Rate and Rhythm: Normal rate and regular rhythm.  Pulmonary:     Effort: Pulmonary effort is normal.     Comments: Some faint expiratory wheezes but no retractions.  Pulse oximetry 98%.  No rales. Musculoskeletal:     Cervical back: Neck supple.  Neurological:     Mental Status: She is alert.      No results found for any visits on 11/16/22.    The 10-year ASCVD risk score (Arnett DK, et al., 2019) is: 11.8%    Assessment & Plan:    Persistent cough with reactive airway component.  Suspect viral bronchitis.  No further fever.  Mild wheezing on exam.  -Finish out oral prednisone -Patient requesting IM steroids which have helped in the past.  We agreed to give Depo-Medrol 80 mg IM -We wrote for limited Tussionex 1 teaspoon nightly as needed for severe cough.  She is aware of sedation with this.  She knows to measure this out carefully. -Continue rescue inhaler as needed -She is aware that most viral bronchitis takes a few weeks to resolve.  Follow-up immediately for any increased shortness of breath or fever.

## 2022-11-23 ENCOUNTER — Encounter: Payer: Self-pay | Admitting: Family Medicine

## 2022-11-23 ENCOUNTER — Ambulatory Visit (INDEPENDENT_AMBULATORY_CARE_PROVIDER_SITE_OTHER): Payer: Medicare HMO | Admitting: Family Medicine

## 2022-11-23 VITALS — BP 142/66 | HR 89 | Temp 98.0°F | Ht 67.0 in | Wt 173.5 lb

## 2022-11-23 DIAGNOSIS — J219 Acute bronchiolitis, unspecified: Secondary | ICD-10-CM | POA: Diagnosis not present

## 2022-11-23 MED ORDER — AMOXICILLIN-POT CLAVULANATE 875-125 MG PO TABS: 1.0000 | ORAL_TABLET | Freq: Two times a day (BID) | ORAL | 0 refills | Status: DC

## 2022-11-23 NOTE — Progress Notes (Signed)
Established Patient Office Visit  Subjective   Patient ID: Laura [name], female    DOB: 1956/06/06  Age: 67 y.o. MRN: [id number]  Chief Complaint  Patient presents with   Follow-up    HPI   Laura [name] is seen with persistent cough now for about 3 weeks.  Non-productive.   Received steroid IM last week and has less wheezing, but still having cough.   No fever.  No dyspnea.   Cough is mostly dry.   Her main concern is assisting her mother who has cancer.  Past Medical History:  Diagnosis Date   ABNORMAL ELECTROCARDIOGRAM 08/05/2010   02-19-15 EKG shows NSR. Pt voices no issues with heart related problems.   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   GERD 01/25/2008   uses Prevacid as needed   History of radiation therapy    Left Breast- 12/16/21-01/13/22- Dr. 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   Tendonitis    right wrist-has soft splint in place.   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   VENTRICULAR HYPERTROPHY, LEFT 09/23/2010   Wears glasses    Past Surgical History:  Procedure Laterality Date   ABDOMINAL HYSTERECTOMY  2002    TAH-LTSO-LOA   ARTHROSCOPIC REPAIR ACL Right 10/1998   right knee    BACK SURGERY  2019   BREAST EXCISIONAL BIOPSY Left 2015   benign   BREAST LUMPECTOMY     BREAST SURGERY Left    lumpectomy-benign 2015   CARDIOVASCULAR STRESS TEST  2011   CARPAL TUNNEL RELEASE  2014   left   CHOLECYSTECTOMY  1985   open   COLONOSCOPY     COLONOSCOPY W/ POLYPECTOMY     DILATION AND CURETTAGE OF UTERUS     ELBOW ARTHROPLASTY  2014   left   ESOPHAGOGASTRODUODENOSCOPY  04/01/1998   KNEE ARTHROSCOPY Bilateral    meniscus tear-left.   NASAL SINUS SURGERY     s/p   ORIF ELBOW FRACTURE  12/23/2011   Procedure: OPEN REDUCTION INTERNAL FIXATION (ORIF) ELBOW/OLECRANON FRACTURE;  Surgeon: Linna Hoff, MD;  Location: [place];  Service: Orthopedics;  Laterality: Left;  LEFT ELBOW ORIF OF PROXIMAL OLECRANON AND RADIAL HEAD, POSSIBLE ARTHROPLASTY   OVARIAN CYST REMOVAL     RADIOACTIVE SEED GUIDED EXCISIONAL BREAST BIOPSY Left 11/04/2021   Procedure: RADIOACTIVE SEED GUIDED EXCISIONAL LEFT BREAST BIOPSY;  Surgeon: Rolm Bookbinder, MD;  Location: [place];  Service: General;  Laterality: Left;   TOTAL KNEE ARTHROPLASTY Right 06/24/2015   Procedure: RIGHT TOTAL KNEE ARTHROPLASTY;  Surgeon: Gaynelle Arabian, MD;  Location[facility];  Service: Orthopedics;  Laterality: Right;   TRANSFORAMINAL LUMBAR INTERBODY FUSION (TLIF) WITH PEDICLE SCREW FIXATION 1 LEVEL Left 03/03/2019   Procedure: Left Lumbar four-five Redo Microdiscectomy with transforaminal lumbar interbody fusion;  Surgeon: Erline Levine, MD;  Location: [place];  Service: Neurosurgery;  Laterality: Left;   UPPER GASTROINTESTINAL ENDOSCOPY     US ECHOCARDIOGRAPHY  2011    reports that she has never smoked. She has never used smokeless tobacco. She reports that she does not drink alcohol and does not use drugs. family history includes Cancer in her maternal aunt; Diabetes in her mother; Heart disease (age of onset: 71) in her father; Lung  cancer in her mother; Other in her brother; Pancreatic cancer in her maternal aunt; Thyroid cancer in her maternal aunt; Thyroid disease in her mother. Allergies  Allergen Reactions   Zometa [Zoledronic Acid] Other (See Comments)    Fever, chills, required ED visit   Other Rash    dermabond    Review of Systems  Constitutional:  Negative for chills, fever and weight loss.  HENT:  Negative for sore throat.   Respiratory:  Positive for cough. Negative for hemoptysis, shortness of breath and wheezing.       Objective:     BP (!) 142/66 (BP Location: Left Arm, Patient Position: Sitting, Cuff Size: Normal)   Pulse 89   Temp 98 F (36.7 C) (Oral)   Ht '5\' 7"'$  (1.702 m)   Wt 173 lb 8 oz (78.7 kg)   SpO2 97%   BMI 27.17 kg/m    Physical Exam Vitals reviewed.  Constitutional:      General: She is not in acute distress.    Appearance: Normal appearance.  Cardiovascular:     Rate and Rhythm: Normal rate and regular rhythm.  Pulmonary:     Effort: Pulmonary effort is normal.     Breath sounds: Normal breath sounds. No wheezing or rales.  Neurological:     Mental Status: She is alert.      No results found for any visits on 11/23/22.    The 10-year ASCVD risk score (Arnett DK, et al., 2019) is: 9.8%    Assessment & Plan:   Problem List Items Addressed This Visit   None Visit Diagnoses     Acute bronchiolitis due to unspecified organism    -  Primary     Suspect probably viral- though not improved after several weeks.   No wheezing noted at this time.  Given duration of symptoms, we elected to cover with Augmentin 875 mg bid for 7 days.   Follow up for any fever or increased shortness of breath.  Consider CXR if no further improvement in one week.   No follow-ups on file.    Carolann Littler, MD

## 2022-11-25 DIAGNOSIS — H25043 Posterior subcapsular polar age-related cataract, bilateral: Secondary | ICD-10-CM | POA: Diagnosis not present

## 2022-11-25 DIAGNOSIS — H524 Presbyopia: Secondary | ICD-10-CM | POA: Diagnosis not present

## 2022-11-25 DIAGNOSIS — H40013 Open angle with borderline findings, low risk, bilateral: Secondary | ICD-10-CM | POA: Diagnosis not present

## 2022-11-25 DIAGNOSIS — H52203 Unspecified astigmatism, bilateral: Secondary | ICD-10-CM | POA: Diagnosis not present

## 2022-11-25 DIAGNOSIS — H43813 Vitreous degeneration, bilateral: Secondary | ICD-10-CM | POA: Diagnosis not present

## 2022-11-25 DIAGNOSIS — H2513 Age-related nuclear cataract, bilateral: Secondary | ICD-10-CM | POA: Diagnosis not present

## 2022-11-27 ENCOUNTER — Other Ambulatory Visit: Payer: Self-pay | Admitting: Family Medicine

## 2022-12-03 DIAGNOSIS — J40 Bronchitis, not specified as acute or chronic: Secondary | ICD-10-CM | POA: Diagnosis not present

## 2022-12-03 DIAGNOSIS — J301 Allergic rhinitis due to pollen: Secondary | ICD-10-CM | POA: Diagnosis not present

## 2022-12-03 DIAGNOSIS — D485 Neoplasm of uncertain behavior of skin: Secondary | ICD-10-CM | POA: Diagnosis not present

## 2022-12-03 DIAGNOSIS — J4540 Moderate persistent asthma, uncomplicated: Secondary | ICD-10-CM | POA: Diagnosis not present

## 2022-12-03 DIAGNOSIS — L820 Inflamed seborrheic keratosis: Secondary | ICD-10-CM | POA: Diagnosis not present

## 2022-12-03 DIAGNOSIS — J3089 Other allergic rhinitis: Secondary | ICD-10-CM | POA: Diagnosis not present

## 2022-12-13 ENCOUNTER — Other Ambulatory Visit: Payer: Self-pay | Admitting: Family Medicine

## 2022-12-27 ENCOUNTER — Other Ambulatory Visit: Payer: Self-pay | Admitting: Family Medicine

## 2023-01-16 ENCOUNTER — Other Ambulatory Visit: Payer: Self-pay | Admitting: Hematology and Oncology

## 2023-01-18 ENCOUNTER — Encounter: Payer: Self-pay | Admitting: Hematology and Oncology

## 2023-01-27 ENCOUNTER — Other Ambulatory Visit: Payer: Self-pay

## 2023-01-27 DIAGNOSIS — Z170 Estrogen receptor positive status [ER+]: Secondary | ICD-10-CM

## 2023-01-29 ENCOUNTER — Inpatient Hospital Stay: Payer: Medicare HMO

## 2023-01-29 ENCOUNTER — Inpatient Hospital Stay: Payer: Medicare HMO | Attending: Hematology and Oncology

## 2023-01-29 ENCOUNTER — Other Ambulatory Visit: Payer: Self-pay

## 2023-01-29 VITALS — BP 164/68 | HR 67 | Temp 98.0°F | Resp 18

## 2023-01-29 DIAGNOSIS — M810 Age-related osteoporosis without current pathological fracture: Secondary | ICD-10-CM | POA: Insufficient documentation

## 2023-01-29 DIAGNOSIS — C50412 Malignant neoplasm of upper-outer quadrant of left female breast: Secondary | ICD-10-CM | POA: Diagnosis not present

## 2023-01-29 DIAGNOSIS — Z79810 Long term (current) use of selective estrogen receptor modulators (SERMs): Secondary | ICD-10-CM | POA: Diagnosis not present

## 2023-01-29 DIAGNOSIS — Z923 Personal history of irradiation: Secondary | ICD-10-CM | POA: Diagnosis not present

## 2023-01-29 DIAGNOSIS — Z170 Estrogen receptor positive status [ER+]: Secondary | ICD-10-CM | POA: Diagnosis not present

## 2023-01-29 LAB — CMP (CANCER CENTER ONLY)
ALT: 32 U/L (ref 0–44)
AST: 30 U/L (ref 15–41)
Albumin: 4.6 g/dL (ref 3.5–5.0)
Alkaline Phosphatase: 49 U/L (ref 38–126)
Anion gap: 6 (ref 5–15)
BUN: 13 mg/dL (ref 8–23)
CO2: 28 mmol/L (ref 22–32)
Calcium: 8.8 mg/dL — ABNORMAL LOW (ref 8.9–10.3)
Chloride: 109 mmol/L (ref 98–111)
Creatinine: 0.67 mg/dL (ref 0.44–1.00)
GFR, Estimated: >60 mL/min (ref >60)
Glucose, Bld: 104 mg/dL — ABNORMAL HIGH (ref 70–99)
Potassium: 3.8 mmol/L (ref 3.5–5.1)
Sodium: 143 mmol/L (ref 135–145)
Total Bilirubin: 0.6 mg/dL (ref 0.3–1.2)
Total Protein: 7.3 g/dL (ref 6.5–8.1)

## 2023-01-29 LAB — CBC WITH DIFFERENTIAL (CANCER CENTER ONLY)
Abs Immature Granulocytes: 0.01 10*3/uL (ref 0.00–0.07)
Basophils Absolute: 0.1 10*3/uL (ref 0.0–0.1)
Basophils Relative: 1 %
Eosinophils Absolute: 0.1 10*3/uL (ref 0.0–0.5)
Eosinophils Relative: 2 %
HCT: 40.3 % (ref 36.0–46.0)
Hemoglobin: 13.5 g/dL (ref 12.0–15.0)
Immature Granulocytes: 0 %
Lymphocytes Relative: 39 %
Lymphs Abs: 2.2 10*3/uL (ref 0.7–4.0)
MCH: 31.4 pg (ref 26.0–34.0)
MCHC: 33.5 g/dL (ref 30.0–36.0)
MCV: 93.7 fL (ref 80.0–100.0)
Monocytes Absolute: 0.4 10*3/uL (ref 0.1–1.0)
Monocytes Relative: 7 %
Neutro Abs: 2.9 10*3/uL (ref 1.7–7.7)
Neutrophils Relative %: 51 %
Platelet Count: 245 10*3/uL (ref 150–400)
RBC: 4.3 MIL/uL (ref 3.87–5.11)
RDW: 13.9 % (ref 11.5–15.5)
WBC Count: 5.6 10*3/uL (ref 4.0–10.5)
nRBC: 0 % (ref 0.0–0.2)

## 2023-01-29 MED ORDER — DENOSUMAB 60 MG/ML ~~LOC~~ SOSY
60.0000 mg | PREFILLED_SYRINGE | Freq: Once | SUBCUTANEOUS | Status: AC
  Administered 2023-01-29: 60 mg via SUBCUTANEOUS
  Filled 2023-01-29: qty 1

## 2023-01-29 MED ORDER — SODIUM CHLORIDE 0.9 % IV SOLN: Freq: Once | INTRAVENOUS | Status: DC

## 2023-01-29 NOTE — Patient Instructions (Signed)
Denosumab Injection (Osteoporosis) What is this medication? DENOSUMAB (den oh SUE mab) prevents and treats osteoporosis. It works by making your bones stronger and less likely to break (fracture). It is a monoclonal antibody. This medicine may be used for other purposes; ask your health care provider or pharmacist if you have questions. COMMON BRAND NAME(S): Prolia What should I tell my care team before I take this medication? They need to know if you have any of these conditions: Dental or gum disease, or plan to have dental surgery or a tooth pulled Infection Kidney disease Low levels of calcium or vitamin D in your blood On dialysis Poor nutrition Skin conditions Thyroid disease, or have had thyroid or parathyroid surgery Trouble absorbing minerals in your stomach or intestine An unusual or allergic reaction to denosumab, other medications, foods, dyes, or preservatives Pregnant or trying to get pregnant Breastfeeding How should I use this medication? This medication is injected under the skin. It is given by your care team in a hospital or clinic setting. A special MedGuide will be given to you before each treatment. Be sure to read this information carefully each time. Talk to your care team about the use of this medication in children. Special care may be needed. Overdosage: If you think you have taken too much of this medicine contact a poison control center or emergency room at once. NOTE: This medicine is only for you. Do not share this medicine with others. What if I miss a dose? Keep appointments for follow-up doses. It is important not to miss your dose. Call your care team if you are unable to keep an appointment. What may interact with this medication? Do not take this medication with any of the following: Other medications that contain denosumab This medication may also interact with the following: Medications that lower your chance of fighting infection Steroid  medications, such as prednisone or cortisone This list may not describe all possible interactions. Give your health care provider a list of all the medicines, herbs, non-prescription drugs, or dietary supplements you use. Also tell them if you smoke, drink alcohol, or use illegal drugs. Some items may interact with your medicine. What should I watch for while using this medication? Your condition will be monitored carefully while you are receiving this medication. You may need blood work while taking this medication. This medication may increase your risk of getting an infection. Call your care team for advice if you get a fever, chills, sore throat, or other symptoms of a cold or flu. Do not treat yourself. Try to avoid being around people who are sick. Tell your dentist and dental surgeon that you are taking this medication. You should not have major dental surgery while on this medication. See your dentist to have a dental exam and fix any dental problems before starting this medication. Take good care of your teeth while on this medication. Make sure you see your dentist for regular follow-up appointments. You should make sure you get enough calcium and vitamin D while you are taking this medication. Discuss the foods you eat and the vitamins you take with your care team. Talk to your care team if you are pregnant or think you might be pregnant. This medication can cause serious birth defects if taken during pregnancy and for 5 months after the last dose. You will need a negative pregnancy test before starting this medication. Contraception is recommended while taking this medication and for 5 months after the last dose. Your care   team can help you find the option that works for you. Talk to your care team before breastfeeding. Changes to your treatment plan may be needed. What side effects may I notice from receiving this medication? Side effects that you should report to your care team as soon as  possible: Allergic reactions--skin rash, itching, hives, swelling of the face, lips, tongue, or throat Infection--fever, chills, cough, sore throat, wounds that don't heal, pain or trouble when passing urine, general feeling of discomfort or being unwell Low calcium level--muscle pain or cramps, confusion, tingling, or numbness in the hands or feet Osteonecrosis of the jaw--pain, swelling, or redness in the mouth, numbness of the jaw, poor healing after dental work, unusual discharge from the mouth, visible bones in the mouth Severe bone, joint, or muscle pain Skin infection--skin redness, swelling, warmth, or pain Side effects that usually do not require medical attention (report these to your care team if they continue or are bothersome): Back pain Headache Joint pain Muscle pain Pain in the hands, arms, legs, or feet Runny or stuffy nose Sore throat This list may not describe all possible side effects. Call your doctor for medical advice about side effects. You may report side effects to FDA at 1-800-FDA-1088. Where should I keep my medication? This medication is given in a hospital or clinic. It will not be stored at home. NOTE: This sheet is a summary. It may not cover all possible information. If you have questions about this medicine, talk to your doctor, pharmacist, or health care provider.  2023 Elsevier/Gold Standard (2022-01-12 00:00:00)  

## 2023-02-10 ENCOUNTER — Encounter: Payer: Self-pay | Admitting: Gastroenterology

## 2023-02-22 ENCOUNTER — Encounter: Payer: Self-pay | Admitting: Gastroenterology

## 2023-03-13 ENCOUNTER — Other Ambulatory Visit: Payer: Self-pay | Admitting: Family Medicine

## 2023-03-25 IMAGING — DX DG CHEST 1V PORT
1 series · 1 of 1 positions shown · non-contrast
Comparison: 03/23/2011

CLINICAL DATA: Fever, chills

EXAM:
PORTABLE CHEST 1 VIEW

[chest ap]
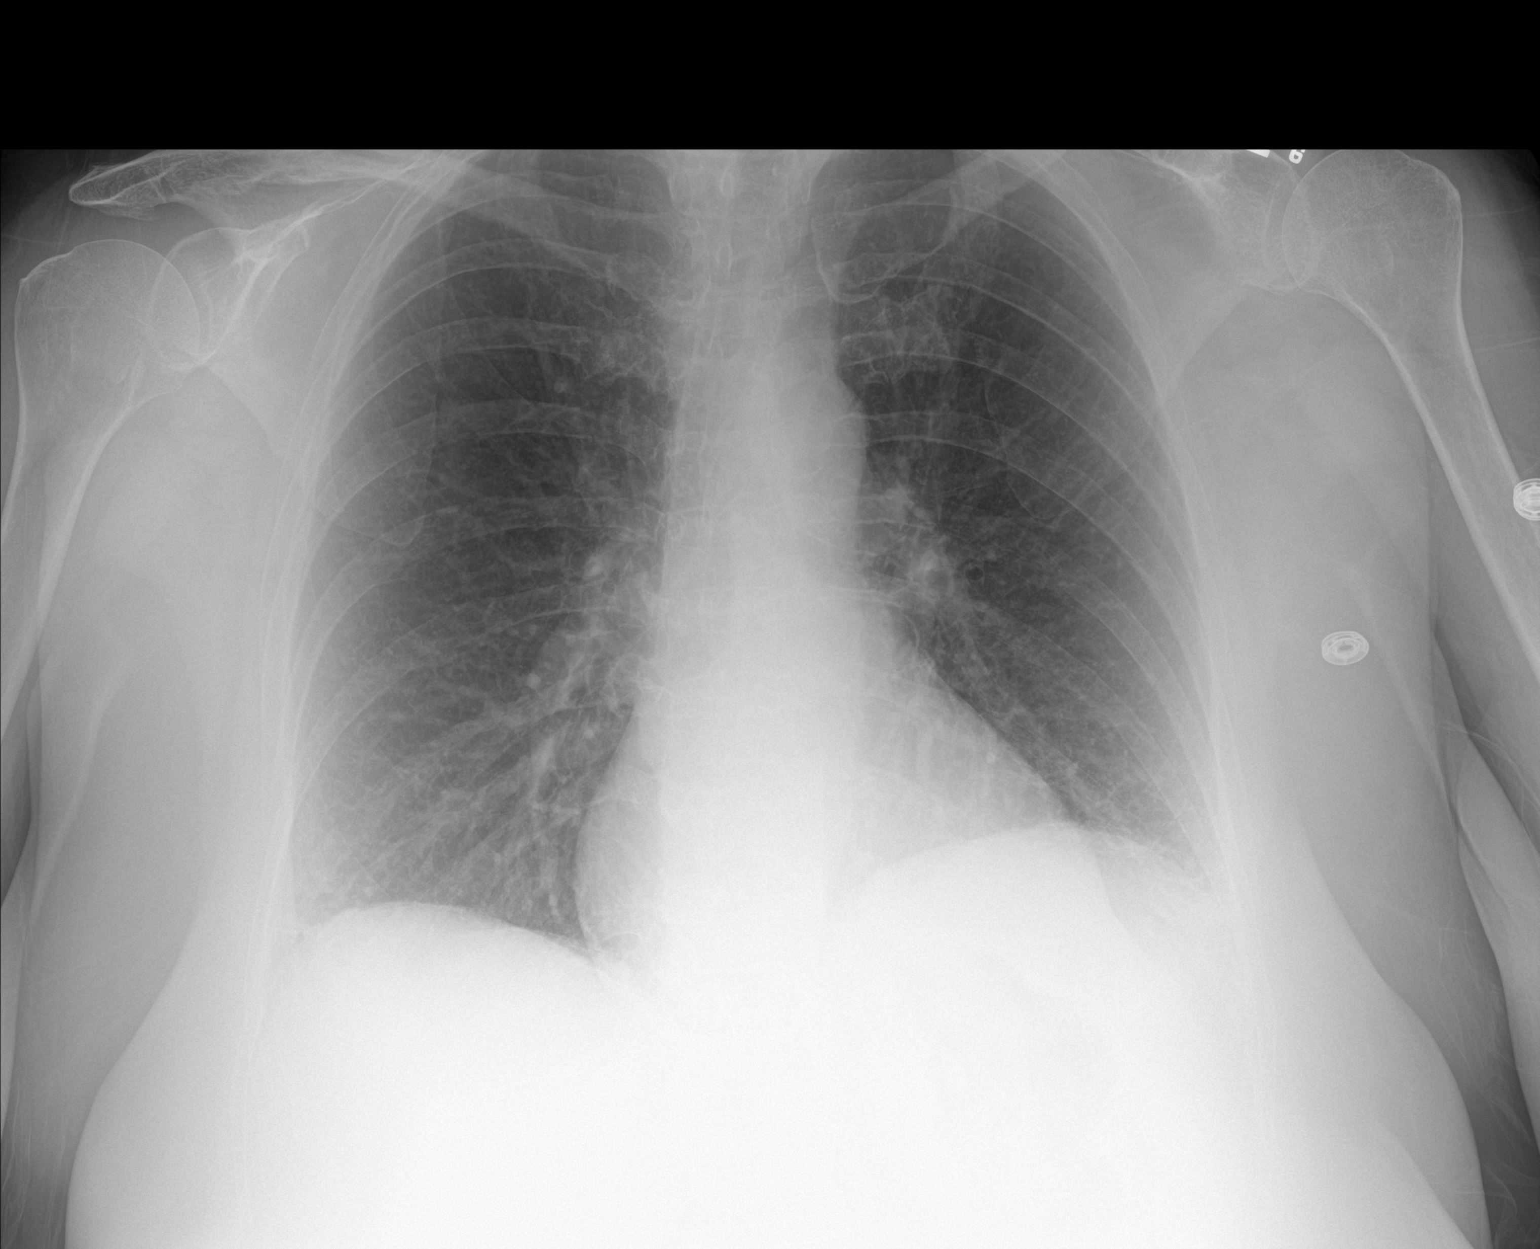

[1 of 1 positions shown; findings below may reference images not displayed]

FINDINGS: Heart and mediastinal contours are within normal limits. No focal
opacities or effusions. No acute bony abnormality.
IMPRESSION: No active disease.

## 2023-04-01 ENCOUNTER — Encounter: Payer: Medicare HMO | Admitting: Gastroenterology

## 2023-04-08 ENCOUNTER — Ambulatory Visit
Admission: EM | Admit: 2023-04-08 | Discharge: 2023-04-08 | Disposition: A | Discharge status: Home / Self Care | Payer: Medicare HMO | Attending: Internal Medicine | Admitting: Internal Medicine

## 2023-04-08 DIAGNOSIS — R8290 Unspecified abnormal findings in urine: Secondary | ICD-10-CM | POA: Insufficient documentation

## 2023-04-08 LAB — POCT URINALYSIS DIP (MANUAL ENTRY)
Bilirubin, UA: NEGATIVE
Glucose, UA: NEGATIVE mg/dL
Ketones, POC UA: NEGATIVE mg/dL
Nitrite, UA: NEGATIVE
Protein Ur, POC: NEGATIVE mg/dL
Spec Grav, UA: 1.015 (ref 1.010–1.025)
Urobilinogen, UA: 0.2 E.U./dL
pH, UA: 6 (ref 5.0–8.0)

## 2023-04-08 MED ORDER — SULFAMETHOXAZOLE-TRIMETHOPRIM 800-160 MG PO TABS: 1.0000 | ORAL_TABLET | Freq: Two times a day (BID) | ORAL | 0 refills | Status: DC

## 2023-04-08 NOTE — ED Triage Notes (Signed)
Pt reports she has foul smelling odor,  lower abdominal pain, pressure when urinating and lower back pain

## 2023-04-08 NOTE — ED Provider Notes (Signed)
[facility]    CSN: [id number] Arrival date & time: 04/08/23  1055      History   Chief Complaint No chief complaint on file.   HPI Laura [name] is a 67 y.o. female who presents with slight RLQ pain, and strong bad odor of her urin since yesterday and in the past this are exactly how she had similar symptoms and had a UTI. She has not had a UTI in several years. Is on Tamoxifen which causes a discharge, but is not any different. She has been pushing to drink a lot of water. Denies constipation. She also states the RLQ pain resolves after she voids.     Past Medical History:  Diagnosis Date   ABNORMAL ELECTROCARDIOGRAM 08/05/2010   02-19-15 EKG shows NSR. Pt voices no issues with heart related problems.   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   GERD 01/25/2008   uses Prevacid as needed   History of radiation therapy    Left Breast- 12/16/21-01/13/22- Dr. 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   Tendonitis    right wrist-has soft splint in place.   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   Genetic testing 06/24/2022   Malignant neoplasm of upper-outer quadrant of left breast in female, estrogen receptor positive (HCC) 11/21/2021   Allergic rhinitis due to pollen 10/10/2021   Moderate persistent asthma, uncomplicated 10/10/2021   Herniated lumbar disc without myelopathy 03/03/2019   OA (osteoarthritis) of knee 06/24/2015   Preop cardiovascular exam 02/19/2015   UTI (urinary tract infection) 12/16/2013   Osteoporosis 10/30/2013   Menopausal state 05/18/2013   Routine general medical examination at a health care facility 12/02/2011   Impaired glucose tolerance 01/11/2011   Sternal fracture 12/29/2010   Preventative health care 12/26/2010   VENTRICULAR HYPERTROPHY, LEFT 09/23/2010   Essential hypertension 08/05/2010   ABNORMAL ELECTROCARDIOGRAM 08/05/2010   Tibialis tendinitis 05/13/2010   CONSTIPATION 02/21/2008   ESOPHAGEAL STRICTURE 02/17/2008   ACNE, MILD 02/17/2008   GERD 01/25/2008   HYPERCHOLESTEROLEMIA 01/19/2008   Unspecified disorder of liver 01/19/2008   METATARSALGIA 11/08/2007   Cavus deformity of foot, 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    Past Surgical History:  Procedure Laterality Date   ABDOMINAL HYSTERECTOMY  2002   TAH-LTSO-LOA   ARTHROSCOPIC REPAIR ACL Right 10/1998   right knee    BACK SURGERY  2019  BREAST EXCISIONAL BIOPSY Left 2015   benign   BREAST LUMPECTOMY     BREAST SURGERY Left    lumpectomy-benign 2015   CARDIOVASCULAR STRESS TEST  2011   CARPAL TUNNEL RELEASE  2014   left   CHOLECYSTECTOMY  1985   open   COLONOSCOPY     COLONOSCOPY W/ POLYPECTOMY     DILATION AND CURETTAGE OF UTERUS     ELBOW ARTHROPLASTY  2014   left   ESOPHAGOGASTRODUODENOSCOPY  04/01/1998   KNEE ARTHROSCOPY Bilateral    meniscus tear-left.   NASAL SINUS SURGERY     s/p   ORIF  ELBOW FRACTURE  12/23/2011   Procedure: OPEN REDUCTION INTERNAL FIXATION (ORIF) ELBOW/OLECRANON FRACTURE;  Surgeon: Sharma Covert, MD;  Location: [facility] OR;  Service: Orthopedics;  Laterality: Left;  LEFT ELBOW ORIF OF PROXIMAL OLECRANON AND RADIAL HEAD, POSSIBLE ARTHROPLASTY   OVARIAN CYST REMOVAL     RADIOACTIVE SEED GUIDED EXCISIONAL BREAST BIOPSY Left 11/04/2021   Procedure: RADIOACTIVE SEED GUIDED EXCISIONAL LEFT BREAST BIOPSY;  Surgeon: Emelia Loron, MD;  Location: [facility];  Service: General;  Laterality: Left;   TOTAL KNEE ARTHROPLASTY Right 06/24/2015   Procedure: RIGHT TOTAL KNEE ARTHROPLASTY;  Surgeon: Ollen Gross, MD;  Location: [facility];  Service: Orthopedics;  Laterality: Right;   TRANSFORAMINAL LUMBAR INTERBODY FUSION (TLIF) WITH PEDICLE SCREW FIXATION 1 LEVEL Left 03/03/2019   Procedure: Left Lumbar four-five Redo Microdiscectomy with transforaminal lumbar interbody fusion;  Surgeon: Maeola Harman, MD;  Location: [facility] OR;  Service: Neurosurgery;  Laterality: Left;   UPPER GASTROINTESTINAL ENDOSCOPY     US ECHOCARDIOGRAPHY  2011    OB History   No obstetric history on file.      Home Medications    Prior to Admission medications   Medication Sig Start Date End Date Taking? Authorizing Provider  sulfamethoxazole-trimethoprim (BACTRIM DS) 800-160 MG tablet Take 1 tablet by mouth 2 (two) times daily for 7 days. 04/08/23 04/15/23 Yes Rodriguez-Southworth, Nettie Elm, PA-C  amLODipine (NORVASC) 5 MG tablet TAKE 1 TABLET (5 MG TOTAL) BY MOUTH DAILY. 12/28/22   Burchette, Elberta Fortis, MD  Aspirin (VAZALORE) 81 MG CAPS Take 81 mg by mouth daily.    [provider]  calcium carbonate (OS-CAL - DOSED IN MG OF ELEMENTAL CALCIUM) 1250 (500 Ca) MG tablet Take 1 tablet by mouth.    [provider]  ketoconazole (NIZORAL) 2 % cream Apply 1 Application topically daily. 04/15/22   Loa Socks, NP  losartan (COZAAR) 100 MG tablet TAKE 1 TABLET BY MOUTH  EVERY DAY 11/27/22   Burchette, Elberta Fortis, MD  Multiple Vitamin (MULTIVITAMIN) tablet Take 1 tablet by mouth daily.    [provider]  tamoxifen (NOLVADEX) 20 MG tablet TAKE 1 TABLET BY MOUTH EVERY DAY 01/18/23   Serena Croissant, MD  traZODone (DESYREL) 50 MG tablet TAKE 1/2 TO 1 TABLET BY MOUTH AT BEDTIME AS NEEDED FOR SLEEP 03/16/23   Burchette, Elberta Fortis, MD  vitamin E 400 UNIT capsule Take 400 Units by mouth daily.    [provider]  olmesartan (BENICAR) 20 MG tablet Take 20 mg by mouth daily.    11/30/11  [provider]    Family History Family History  Problem Relation Age of Onset   Diabetes Mother    Thyroid disease Mother        hypothyroidism   Lung cancer Mother        no smoking hx   Heart disease Father 50  cad   Other Brother        benign brain mass in late 30s   Pancreatic cancer Maternal Aunt        dx and d. late 30s   Cancer Maternal Aunt        unknown GYN; mets; dx and d. 60s   Thyroid cancer Maternal Aunt        mets; dx and d. 44s   Colon cancer Neg Hx    Colon polyps Neg Hx    Esophageal cancer Neg Hx    Stomach cancer Neg Hx    Rectal cancer Neg Hx    Breast cancer Neg Hx     Social History Social History   Tobacco Use   Smoking status: Never   Smokeless tobacco: Never  Vaping Use   Vaping status: Never Used  Substance Use Topics   Alcohol use: No   Drug use: No     Allergies   Zometa [zoledronic acid] and Other   Review of Systems Review of Systems  Constitutional:  Negative for chills and fever.  Gastrointestinal:  Positive for abdominal pain.  Genitourinary:  Positive for pelvic pain. Negative for difficulty urinating, dysuria, flank pain, frequency, urgency and vaginal discharge.     Physical Exam Triage Vital Signs ED Triage Vitals  Encounter Vitals Group     BP 04/08/23 1059 (!) 159/91     Systolic BP Percentile --      Diastolic BP Percentile --      Pulse Rate 04/08/23 1059 87     Resp 04/08/23  1059 16     Temp 04/08/23 1101 98.2 F (36.8 C)     Temp Source 04/08/23 1059 Oral     SpO2 04/08/23 1059 98 %     Weight --      Height --      Head Circumference --      Peak Flow --      Pain Score 04/08/23 1101 0     Pain Loc --      Pain Education --      Exclude from Growth Chart --    No data found.  Updated Vital Signs BP (!) 159/91 (BP Location: Right Arm)   Pulse 87   Temp 98.2 F (36.8 C) (Oral)   Resp 16   SpO2 98%   Visual Acuity Right Eye Distance:   Left Eye Distance:   Bilateral Distance:    Right Eye Near:   Left Eye Near:    Bilateral Near:     Physical Exam Physical Exam Vitals and nursing note reviewed.  Constitutional:      General: She is not in acute distress.    Appearance: She is not toxic-appearing.  HENT:     Head: Normocephalic.     Right Ear: External ear normal.     Left Ear: External ear normal.  Eyes:     General: No scleral icterus.    Conjunctiva/sclera: Conjunctivae normal.  Pulmonary:     Effort: Pulmonary effort is normal.  Abdominal:     General: Bowel sounds are normal.     Palpations: Abdomen is soft. There is no mass.     Tenderness: There is no guarding or rebound.     Comments: - CVA tenderness   Musculoskeletal:        General: Normal range of motion.     Cervical back: Neck supple.     Comments: BACK- no muscular tenderness on area  of complaint with palpation  Skin:    General: Skin is warm and dry.     Findings: No rash.  Neurological:     Mental Status: She is alert and oriented to person, place, and time.     Gait: Gait normal.  Psychiatric:        Mood and Affect: Mood normal.        Behavior: Behavior normal.        Thought Content: Thought content normal.        Judgment: Judgment normal.    UC Treatments / Results  Labs (all labs ordered are listed, but only abnormal results are displayed) Labs Reviewed  POCT URINALYSIS DIP (MANUAL ENTRY) - Abnormal; Notable for the following components:       Result Value   Blood, UA trace-lysed (*)    Leukocytes, UA Trace (*)    All other components within normal limits  URINE CULTURE    EKG   Radiology No results found.  Procedures Procedures (including critical care time)  Medications Ordered in UC Medications - No data to display  Initial Impression / Assessment and Plan / UC Course  I have reviewed the triage vital signs and the nursing notes.  Pertinent labs  results that were available during my care of the patient were reviewed by me and considered in my medical decision making (see chart for details).  Strong urine odor RLQ pain  I sent the urine for a culture and in the mean time I started her on Bactrim DS as noted.  Se instructions.  Final Clinical Impressions(s) / UC Diagnoses   Final diagnoses:  Bad odor of urine     Discharge Instructions      We will call you if the urine shows there is no infection or we have to change your antibiotic      ED Prescriptions     Medication Sig Dispense Auth. Provider   sulfamethoxazole-trimethoprim (BACTRIM DS) 800-160 MG tablet Take 1 tablet by mouth 2 (two) times daily for 7 days. 14 tablet Rodriguez-Southworth, Nettie Elm, PA-C      PDMP not reviewed this encounter.   Garey Ham, [place] 04/08/23 1135

## 2023-04-08 NOTE — Discharge Instructions (Addendum)
We will call you if the urine shows there is no infection or we have to change your antibiotic

## 2023-04-09 ENCOUNTER — Encounter: Payer: Self-pay | Admitting: Hematology and Oncology

## 2023-04-10 ENCOUNTER — Telehealth: Payer: Self-pay | Admitting: Family Medicine

## 2023-04-10 ENCOUNTER — Telehealth: Payer: Self-pay

## 2023-04-10 MED ORDER — NITROFURANTOIN MONOHYD MACRO 100 MG PO CAPS: 100.0000 mg | ORAL_CAPSULE | Freq: Two times a day (BID) | ORAL | 0 refills | Status: DC

## 2023-04-10 NOTE — Telephone Encounter (Signed)
Patient came to front desk c/o rash reaction to bactrim for UTI, changed to macrobid and added to allergy list

## 2023-04-10 NOTE — Telephone Encounter (Signed)
Pt repots to UC to inform us that she had a reaction to the bactrim that was prescribed to her x 2 days ago. Stated she had severe itching. Notfied provider. Macrobid script has been sent in to pharmacy.

## 2023-04-12 ENCOUNTER — Ambulatory Visit (AMBULATORY_SURGERY_CENTER): Payer: Medicare HMO

## 2023-04-12 VITALS — Ht 67.0 in | Wt 170.0 lb

## 2023-04-12 DIAGNOSIS — Z1501 Genetic susceptibility to malignant neoplasm of breast: Secondary | ICD-10-CM

## 2023-04-12 DIAGNOSIS — Z1211 Encounter for screening for malignant neoplasm of colon: Secondary | ICD-10-CM

## 2023-04-12 DIAGNOSIS — Z1509 Genetic susceptibility to other malignant neoplasm: Secondary | ICD-10-CM

## 2023-04-12 DIAGNOSIS — Z1589 Genetic susceptibility to other disease: Secondary | ICD-10-CM

## 2023-04-12 DIAGNOSIS — Z1502 Genetic susceptibility to malignant neoplasm of ovary: Secondary | ICD-10-CM

## 2023-04-12 MED ORDER — NA SULFATE-K SULFATE-MG SULF 17.5-3.13-1.6 GM/177ML PO SOLN
1.0000 | Freq: Once | ORAL | 0 refills | Status: AC
Start: 2023-04-12 — End: 2023-04-12

## 2023-04-12 NOTE — Progress Notes (Signed)

## 2023-04-14 ENCOUNTER — Other Ambulatory Visit: Payer: Self-pay | Admitting: Hematology and Oncology

## 2023-04-29 DIAGNOSIS — D1801 Hemangioma of skin and subcutaneous tissue: Secondary | ICD-10-CM | POA: Diagnosis not present

## 2023-04-29 DIAGNOSIS — L820 Inflamed seborrheic keratosis: Secondary | ICD-10-CM | POA: Diagnosis not present

## 2023-04-29 DIAGNOSIS — D225 Melanocytic nevi of trunk: Secondary | ICD-10-CM | POA: Diagnosis not present

## 2023-04-29 DIAGNOSIS — Z85828 Personal history of other malignant neoplasm of skin: Secondary | ICD-10-CM | POA: Diagnosis not present

## 2023-04-29 DIAGNOSIS — D485 Neoplasm of uncertain behavior of skin: Secondary | ICD-10-CM | POA: Diagnosis not present

## 2023-04-29 DIAGNOSIS — L814 Other melanin hyperpigmentation: Secondary | ICD-10-CM | POA: Diagnosis not present

## 2023-04-29 DIAGNOSIS — L821 Other seborrheic keratosis: Secondary | ICD-10-CM | POA: Diagnosis not present

## 2023-05-08 ENCOUNTER — Encounter: Payer: Self-pay | Admitting: Certified Registered Nurse Anesthetist

## 2023-05-11 ENCOUNTER — Ambulatory Visit (AMBULATORY_SURGERY_CENTER): Payer: Medicare HMO | Admitting: Gastroenterology

## 2023-05-11 ENCOUNTER — Encounter: Payer: Self-pay | Admitting: Gastroenterology

## 2023-05-11 VITALS — BP 133/60 | HR 61 | Temp 98.9°F | Resp 16 | Ht 67.0 in | Wt 170.0 lb

## 2023-05-11 DIAGNOSIS — I10 Essential (primary) hypertension: Secondary | ICD-10-CM | POA: Diagnosis not present

## 2023-05-11 DIAGNOSIS — Z1211 Encounter for screening for malignant neoplasm of colon: Secondary | ICD-10-CM | POA: Diagnosis not present

## 2023-05-11 DIAGNOSIS — J45909 Unspecified asthma, uncomplicated: Secondary | ICD-10-CM | POA: Diagnosis not present

## 2023-05-11 DIAGNOSIS — Z1589 Genetic susceptibility to other disease: Secondary | ICD-10-CM

## 2023-05-11 NOTE — Progress Notes (Signed)
Report given to PACU, vss 

## 2023-05-11 NOTE — Patient Instructions (Signed)
Handouts Provided:  Diverticulosis  YOU HAD AN ENDOSCOPIC PROCEDURE TODAY AT THE [facility]:   Refer to the procedure report that was given to you for any specific questions about what was found during the examination.  If the procedure report does not answer your questions, please call your gastroenterologist to clarify.  If you requested that your care partner not be given the details of your procedure findings, then the procedure report has been included in a sealed envelope for you to review at your convenience later.  YOU SHOULD EXPECT: Some feelings of bloating in the abdomen. Passage of more gas than usual.  Walking can help get rid of the air that was put into your GI tract during the procedure and reduce the bloating. If you had a lower endoscopy (such as a colonoscopy or flexible sigmoidoscopy) you may notice spotting of blood in your stool or on the toilet paper. If you underwent a bowel prep for your procedure, you may not have a normal bowel movement for a few days.  Please Note:  You might notice some irritation and congestion in your nose or some drainage.  This is from the oxygen used during your procedure.  There is no need for concern and it should clear up in a day or so.  SYMPTOMS TO REPORT IMMEDIATELY:  Following lower endoscopy (colonoscopy or flexible sigmoidoscopy):  Excessive amounts of blood in the stool  Significant tenderness or worsening of abdominal pains  Swelling of the abdomen that is new, acute  Fever of 100F or higher  For urgent or emergent issues, a gastroenterologist can be reached at any hour by calling [phone]. Do not use MyChart messaging for urgent concerns.    DIET:  We do recommend a small meal at first, but then you may proceed to your regular diet.  Drink plenty of fluids but you should avoid alcoholic beverages for 24 hours.  ACTIVITY:  You should plan to take it easy for the rest of today and you should NOT DRIVE or use heavy  machinery until tomorrow (because of the sedation medicines used during the test).    FOLLOW UP: Our staff will call the number listed on your records the next business day following your procedure.  We will call around 7:15- 8:00 am to check on you and address any questions or concerns that you may have regarding the information given to you following your procedure. If we do not reach you, we will leave a message.     If any biopsies were taken you will be contacted by phone or by letter within the next 1-3 weeks.  Please call us at [phone] if you have not heard about the biopsies in 3 weeks.    SIGNATURES/CONFIDENTIALITY: You and/or your care partner have signed paperwork which will be entered into your electronic medical record.  These signatures attest to the fact that that the information above on your After Visit Summary has been reviewed and is understood.  Full responsibility of the confidentiality of this discharge information lies with you and/or your care-partner.  

## 2023-05-11 NOTE — Op Note (Signed)
[facility] Patient Name: Laura [name] Procedure Date: 05/11/2023 8:54 AM MRN: [id number] Endoscopist: Sherilyn Cooter L. Myrtie Neither , MD, [id number] Age: 67 Referring MD:  Date of Birth: 16-Sep-1955 Gender: Female Account #: [id number] Procedure:                Colonoscopy Indications:              Screening for colorectal malignant neoplasm, CHEK2                            mutation discovered on testing with breast cancer                            diagnosis                           no polyps Aug 2019 or in 2009 Medicines:                Monitored Anesthesia Care Procedure:                Pre-Anesthesia Assessment:                           - Prior to the procedure, a History and Physical                            was performed, and patient medications and                            allergies were reviewed. The patient's tolerance of                            previous anesthesia was also reviewed. The risks                            and benefits of the procedure and the sedation                            options and risks were discussed with the patient.                            All questions were answered, and informed consent                            was obtained. Prior Anticoagulants: The patient has                            taken no anticoagulant or antiplatelet agents. ASA                            Grade Assessment: II - A patient with mild systemic                            disease. After reviewing the risks and benefits,  the patient was deemed in satisfactory condition to                            undergo the procedure.                           After obtaining informed consent, the colonoscope                            was passed under direct vision. Throughout the                            procedure, the patient's blood pressure, pulse, and                            oxygen saturations were monitored continuously. The                             Olympus CF-HQ190L [id number]) Colonoscope was                            introduced through the anus and advanced to the the                            cecum, identified by appendiceal orifice and                            ileocecal valve. The colonoscopy was performed                            without difficulty. The patient tolerated the                            procedure well. The quality of the bowel                            preparation was good. The ileocecal valve,                            appendiceal orifice, and rectum were photographed. Scope In: 9:10:20 AM Scope Out: 9:22:45 AM Scope Withdrawal Time: 0 hours 9 minutes 38 seconds  Total Procedure Duration: 0 hours 12 minutes 25 seconds  Findings:                 The perianal and digital rectal examinations were                            normal.                           Repeat examination of right colon under NBI                            performed.  Multiple diverticula were found in the left colon.                           The exam was otherwise without abnormality on                            direct and retroflexion views. Complications:            No immediate complications. Estimated Blood Loss:     Estimated blood loss: none. Impression:               - Diverticulosis in the left colon.                           - The examination was otherwise normal on direct                            and retroflexion views.                           - No specimens collected. Recommendation:           - Patient has a contact number available for                            emergencies. The signs and symptoms of potential                            delayed complications were discussed with the                            patient. Return to normal activities tomorrow.                            Written discharge instructions were provided to the                            patient.                            - Resume previous diet.                           - Continue present medications.                           - Repeat colonoscopy in 5 years for screening                            purposes. Gricelda Foland L. Myrtie Neither, MD 05/11/2023 9:28:28 AM This report has been signed electronically.

## 2023-05-11 NOTE — Progress Notes (Signed)
History and Physical:  This patient presents for endoscopic testing for: Encounter Diagnoses  Name Primary?   Special screening for malignant neoplasms, colon Yes   CHEK2 gene mutation positive     No polyps 2019 or 2009, and no Fam Hx CRC Found to have CHEK2 mutation with breast CA testing in 2023. Patient denies chronic abdominal pain, rectal bleeding, constipation or diarrhea.   Patient is otherwise without complaints or active issues today.   Past Medical History: Past Medical History:  Diagnosis Date   ABNORMAL ELECTROCARDIOGRAM 08/05/2010   02-19-15 EKG shows NSR. Pt voices no issues with heart related problems.   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   GERD 01/25/2008   uses Prevacid as needed   History of radiation therapy    Left Breast- 12/16/21-01/13/22- Dr. 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   Tendonitis    right wrist-has soft splint in place.   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   VENTRICULAR HYPERTROPHY, LEFT 09/23/2010   Wears glasses      Past Surgical History: Past Surgical History:   Procedure Laterality Date   ABDOMINAL HYSTERECTOMY  2002   TAH-LTSO-LOA   ARTHROSCOPIC REPAIR ACL Right 10/1998   right knee    BACK SURGERY  2019   BREAST EXCISIONAL BIOPSY Left 2015   benign   BREAST LUMPECTOMY     BREAST SURGERY Left    lumpectomy-benign 2015   CARDIOVASCULAR STRESS TEST  2011   CARPAL TUNNEL RELEASE  2014   left   CHOLECYSTECTOMY  1985   open   COLONOSCOPY     COLONOSCOPY W/ POLYPECTOMY     DILATION AND CURETTAGE OF UTERUS     ELBOW ARTHROPLASTY  2014   left   ESOPHAGOGASTRODUODENOSCOPY  04/01/1998   KNEE ARTHROSCOPY Bilateral    meniscus tear-left.   NASAL SINUS SURGERY     s/p   ORIF ELBOW FRACTURE  12/23/2011   Procedure: OPEN REDUCTION INTERNAL FIXATION (ORIF) ELBOW/OLECRANON FRACTURE;  Surgeon: Sharma Covert, MD;  Location: [facility] OR;  Service: Orthopedics;  Laterality: Left;  LEFT ELBOW ORIF OF PROXIMAL OLECRANON AND RADIAL HEAD, POSSIBLE ARTHROPLASTY   OVARIAN CYST REMOVAL     RADIOACTIVE SEED GUIDED EXCISIONAL BREAST BIOPSY Left 11/04/2021   Procedure: RADIOACTIVE SEED GUIDED EXCISIONAL LEFT BREAST BIOPSY;  Surgeon: Emelia Loron, MD;  Location: [facility];  Service: General;  Laterality: Left;   TOTAL KNEE ARTHROPLASTY Right 06/24/2015   Procedure: RIGHT TOTAL KNEE ARTHROPLASTY;  Surgeon: Ollen Gross, MD;  Location: [facility] ORS;  Service: Orthopedics;  Laterality: Right;   TRANSFORAMINAL LUMBAR INTERBODY FUSION (TLIF) WITH PEDICLE SCREW FIXATION 1 LEVEL Left 03/03/2019   Procedure: Left Lumbar four-five Redo Microdiscectomy with transforaminal lumbar interbody fusion;  Surgeon: Maeola Harman, MD;  Location: [facility] OR;  Service: Neurosurgery;  Laterality: Left;   UPPER GASTROINTESTINAL ENDOSCOPY     US ECHOCARDIOGRAPHY  2011    Allergies: Allergies  Allergen Reactions   Elemental Sulfur Hives   Zometa [Zoledronic Acid] Other (See Comments)    Fever, chills, required ED visit   Nitrofurantoin Monohyd Macro Other (See Comments)     Chills and eye pain.  Overall flu type symptoms   Bactrim [Sulfamethoxazole-Trimethoprim] Rash   Other Rash    dermabond    Outpatient Meds: Current Outpatient Medications  Medication Sig Dispense Refill   amLODipine (NORVASC) 5 MG tablet TAKE 1 TABLET (5 MG TOTAL) BY MOUTH DAILY. 90 tablet 2   Aspirin (VAZALORE) 81 MG CAPS Take 81 mg by mouth daily.     calcium carbonate (OS-CAL - DOSED IN MG OF ELEMENTAL CALCIUM) 1250 (500 Ca) MG tablet Take 1 tablet by mouth.     Cholecalciferol (D3 ADULT PO)      FIBER ADULT GUMMIES PO      losartan (COZAAR) 100 MG tablet TAKE 1 TABLET BY MOUTH EVERY DAY 90 tablet 3   Multiple Vitamin (MULTIVITAMIN) tablet Take 1 tablet by mouth daily.     Omega-3 Fatty Acids (FISH OIL) 300 MG CAPS      tamoxifen (NOLVADEX) 20 MG tablet TAKE 1 TABLET BY MOUTH EVERY DAY 90 tablet 3   vitamin E 400 UNIT capsule Take 400 Units by mouth daily.     ketoconazole (NIZORAL) 2 % cream Apply 1 Application topically daily. (Patient not taking: Reported on 04/12/2023) 15 g 0   traZODone (DESYREL) 50 MG tablet TAKE 1/2 TO 1 TABLET BY MOUTH AT BEDTIME AS NEEDED FOR SLEEP 90 tablet 1   No current facility-administered medications for this visit.      ___________________________________________________________________ Objective   Exam:  BP (!) 175/75   Pulse 72   Temp 98.9 F (37.2 C)   Resp 13   Ht 5\' 7"  (1.702 m)   Wt 170 lb (77.1 kg)   SpO2 100%   BMI 26.63 kg/m   CV: regular , S1/S2 Resp: clear to auscultation bilaterally, normal RR and effort noted GI: soft, no tenderness, with active bowel sounds.   Assessment: Encounter Diagnoses  Name Primary?   Special screening for malignant neoplasms, colon Yes   CHEK2 gene mutation positive      Plan: Colonoscopy   The benefits and risks of the planned procedure were described in detail with the patient or (when appropriate) their health care proxy.  Risks were outlined as including, but not limited to,  bleeding, infection, perforation, adverse medication reaction leading to cardiac or pulmonary decompensation, pancreatitis (if ERCP).  The limitation of incomplete mucosal visualization was also discussed.  No guarantees or warranties were given.  The patient is appropriate for an endoscopic procedure in the ambulatory setting.   - Amada Jupiter, MD

## 2023-05-12 ENCOUNTER — Telehealth: Payer: Self-pay

## 2023-05-12 NOTE — Telephone Encounter (Signed)
  Follow up Call-     05/11/2023    7:57 AM  Call back number  Post procedure Call Back phone  # [phone]  Permission to leave phone message Yes     Patient questions:  Do you have a fever, pain , or abdominal swelling? No. Pain Score  0 *  Have you tolerated food without any problems? Yes.    Have you been able to return to your normal activities? Yes.    Do you have any questions about your discharge instructions: Diet   No. Medications  No. Follow up visit  No.  Do you have questions or concerns about your Care? No.  Actions: * If pain score is 4 or above: No action needed, pain <4.

## 2023-05-13 DIAGNOSIS — Z6827 Body mass index (BMI) 27.0-27.9, adult: Secondary | ICD-10-CM | POA: Diagnosis not present

## 2023-05-13 DIAGNOSIS — M5416 Radiculopathy, lumbar region: Secondary | ICD-10-CM | POA: Diagnosis not present

## 2023-05-19 DIAGNOSIS — D485 Neoplasm of uncertain behavior of skin: Secondary | ICD-10-CM | POA: Diagnosis not present

## 2023-05-27 DIAGNOSIS — M5416 Radiculopathy, lumbar region: Secondary | ICD-10-CM | POA: Diagnosis not present

## 2023-05-27 DIAGNOSIS — Z6827 Body mass index (BMI) 27.0-27.9, adult: Secondary | ICD-10-CM | POA: Diagnosis not present

## 2023-06-07 ENCOUNTER — Ambulatory Visit (INDEPENDENT_AMBULATORY_CARE_PROVIDER_SITE_OTHER): Payer: Medicare HMO

## 2023-06-07 DIAGNOSIS — Z23 Encounter for immunization: Secondary | ICD-10-CM | POA: Diagnosis not present

## 2023-06-10 ENCOUNTER — Other Ambulatory Visit: Payer: Self-pay | Admitting: Adult Health

## 2023-06-10 DIAGNOSIS — R928 Other abnormal and inconclusive findings on diagnostic imaging of breast: Secondary | ICD-10-CM

## 2023-07-06 ENCOUNTER — Telehealth: Payer: Self-pay

## 2023-07-06 NOTE — Telephone Encounter (Signed)
Pt called to ask about having her DEXA scheduied. Pt was provided with number to contact [facility]. She verbalized thanks and understanding.

## 2023-07-22 ENCOUNTER — Ambulatory Visit (HOSPITAL_BASED_OUTPATIENT_CLINIC_OR_DEPARTMENT_OTHER)
Admission: RE | Admit: 2023-07-22 | Discharge: 2023-07-22 | Disposition: A | Discharge status: Home / Self Care | Payer: Medicare HMO | Source: Ambulatory Visit | Attending: Hematology and Oncology | Admitting: Hematology and Oncology

## 2023-07-22 DIAGNOSIS — Z780 Asymptomatic menopausal state: Secondary | ICD-10-CM | POA: Diagnosis not present

## 2023-07-22 DIAGNOSIS — M810 Age-related osteoporosis without current pathological fracture: Secondary | ICD-10-CM | POA: Diagnosis not present

## 2023-07-23 ENCOUNTER — Ambulatory Visit
Admission: RE | Admit: 2023-07-23 | Discharge: 2023-07-23 | Disposition: A | Discharge status: Home / Self Care | Payer: Medicare HMO | Source: Ambulatory Visit | Attending: Adult Health | Admitting: Adult Health

## 2023-07-23 DIAGNOSIS — R928 Other abnormal and inconclusive findings on diagnostic imaging of breast: Secondary | ICD-10-CM

## 2023-07-23 DIAGNOSIS — N6489 Other specified disorders of breast: Secondary | ICD-10-CM | POA: Diagnosis not present

## 2023-07-23 HISTORY — DX: Personal history of irradiation: Z92.3

## 2023-08-02 ENCOUNTER — Inpatient Hospital Stay: Payer: Medicare HMO

## 2023-08-02 ENCOUNTER — Other Ambulatory Visit: Payer: Self-pay

## 2023-08-02 ENCOUNTER — Inpatient Hospital Stay: Payer: Medicare HMO | Attending: Hematology and Oncology | Admitting: Hematology and Oncology

## 2023-08-02 ENCOUNTER — Other Ambulatory Visit: Payer: Self-pay | Admitting: Hematology and Oncology

## 2023-08-02 VITALS — BP 163/69 | HR 84 | Temp 97.7°F | Resp 18 | Ht 67.0 in | Wt 174.2 lb

## 2023-08-02 DIAGNOSIS — Z170 Estrogen receptor positive status [ER+]: Secondary | ICD-10-CM | POA: Insufficient documentation

## 2023-08-02 DIAGNOSIS — C50412 Malignant neoplasm of upper-outer quadrant of left female breast: Secondary | ICD-10-CM | POA: Insufficient documentation

## 2023-08-02 DIAGNOSIS — Z79810 Long term (current) use of selective estrogen receptor modulators (SERMs): Secondary | ICD-10-CM | POA: Diagnosis not present

## 2023-08-02 DIAGNOSIS — M810 Age-related osteoporosis without current pathological fracture: Secondary | ICD-10-CM

## 2023-08-02 LAB — CBC WITH DIFFERENTIAL (CANCER CENTER ONLY)
Abs Immature Granulocytes: 0.01 10*3/uL (ref 0.00–0.07)
Basophils Absolute: 0 10*3/uL (ref 0.0–0.1)
Basophils Relative: 1 %
Eosinophils Absolute: 0.2 10*3/uL (ref 0.0–0.5)
Eosinophils Relative: 3 %
HCT: 39.6 % (ref 36.0–46.0)
Hemoglobin: 12.9 g/dL (ref 12.0–15.0)
Immature Granulocytes: 0 %
Lymphocytes Relative: 44 %
Lymphs Abs: 2.7 10*3/uL (ref 0.7–4.0)
MCH: 30.5 pg (ref 26.0–34.0)
MCHC: 32.6 g/dL (ref 30.0–36.0)
MCV: 93.6 fL (ref 80.0–100.0)
Monocytes Absolute: 0.4 10*3/uL (ref 0.1–1.0)
Monocytes Relative: 7 %
Neutro Abs: 2.7 10*3/uL (ref 1.7–7.7)
Neutrophils Relative %: 45 %
Platelet Count: 221 10*3/uL (ref 150–400)
RBC: 4.23 MIL/uL (ref 3.87–5.11)
RDW: 13 % (ref 11.5–15.5)
WBC Count: 6.1 10*3/uL (ref 4.0–10.5)
nRBC: 0 % (ref 0.0–0.2)

## 2023-08-02 LAB — CMP (CANCER CENTER ONLY)
ALT: 24 U/L (ref 0–44)
AST: 25 U/L (ref 15–41)
Albumin: 4.2 g/dL (ref 3.5–5.0)
Alkaline Phosphatase: 40 U/L (ref 38–126)
Anion gap: 5 (ref 5–15)
BUN: 14 mg/dL (ref 8–23)
CO2: 28 mmol/L (ref 22–32)
Calcium: 9.3 mg/dL (ref 8.9–10.3)
Chloride: 107 mmol/L (ref 98–111)
Creatinine: 0.61 mg/dL (ref 0.44–1.00)
GFR, Estimated: >60 mL/min (ref >60)
Glucose, Bld: 110 mg/dL — ABNORMAL HIGH (ref 70–99)
Potassium: 4.2 mmol/L (ref 3.5–5.1)
Sodium: 140 mmol/L (ref 135–145)
Total Bilirubin: 0.5 mg/dL (ref <1.2)
Total Protein: 6.8 g/dL (ref 6.5–8.1)

## 2023-08-02 MED ORDER — EPHEDRINE 5 MG/ML INJ
INTRAVENOUS | Status: AC
  Filled 2023-08-02: qty 5

## 2023-08-02 MED ORDER — PHENYLEPHRINE 80 MCG/ML (10ML) SYRINGE FOR IV PUSH (FOR BLOOD PRESSURE SUPPORT)
PREFILLED_SYRINGE | INTRAVENOUS | Status: AC
  Filled 2023-08-02: qty 10

## 2023-08-02 MED ORDER — DENOSUMAB 60 MG/ML ~~LOC~~ SOSY
60.0000 mg | PREFILLED_SYRINGE | Freq: Once | SUBCUTANEOUS | Status: AC
  Administered 2023-08-02: 60 mg via SUBCUTANEOUS
  Filled 2023-08-02: qty 1

## 2023-08-02 MED ORDER — LIDOCAINE HCL (PF) 2 % IJ SOLN
INTRAMUSCULAR | Status: AC
  Filled 2023-08-02: qty 10

## 2023-08-02 NOTE — Progress Notes (Signed)
Patient Care Team: Kristian Covey, MD as PCP - General (Family Medicine) Luvenia Redden, MD (Obstetrics and Gynecology) Antony Blackbird, MD as Consulting Physician (Radiation Oncology) Emelia Loron, MD as Consulting Physician (General Surgery) Serena Croissant, MD as Consulting Physician (Hematology and Oncology)  DIAGNOSIS:  Encounter Diagnosis  Name Primary?   Malignant neoplasm of upper-outer quadrant of left breast in female, estrogen receptor positive (HCC) Yes    SUMMARY OF ONCOLOGIC HISTORY: Oncology History  Malignant neoplasm of upper-outer quadrant of left breast in female, estrogen receptor positive (HCC)  08/20/2021 Initial Diagnosis   Screening mammogram detected possible distortion in the left breast: Biopsy: Atypical lobular hyperplasia   11/04/2021 Surgery   Left lumpectomy: Grade 2 invasive lobular cancer pleomorphic features, 5 mm, margins negative, ER 100%, PR 95%, Ki-67 2%, HER2 2+ by IHC, FISH negative, ratio 1.31   11/04/2021 Cancer Staging   Staging form: Breast, AJCC 8th Edition - Pathologic stage from 11/04/2021: Stage IA (pT1a, pN0, cM0, G2, ER+, PR+, HER2-) - Signed by Loa Socks, NP on 04/15/2022 Stage prefix: Initial diagnosis Histologic grading system: 3 grade system   12/16/2021 - 01/13/2022 Radiation Therapy   Site Technique Total Dose (Gy) Dose per Fx (Gy) Completed Fx Beam Energies  Breast, Left: Breast_L 3D 40.05/40.05 2.67 15/15 10X  Breast, Left: Breast_L_Bst 3D 12/12 2 6/6 6X, 10X     01/2022 -  Anti-estrogen oral therapy   Tamoxifen   06/23/2022 Genetic Testing   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  Report date is 06/23/2022.   The CancerNext-Expanded gene panel offered by [facility] and includes sequencing, rearrangement, and RNA analysis for the following 77 genes: AIP, ALK, APC, ATM, AXIN2, BAP1, BARD1, BLM, BMPR1A, BRCA1, BRCA2, BRIP1, CDC73, CDH1, CDK4, CDKN1B, CDKN2A, CHEK2, CTNNA1, DICER1,  FANCC, FH, FLCN, GALNT12, KIF1B, LZTR1, MAX, MEN1, MET, MLH1, MSH2, MSH3, MSH6, MUTYH, NBN, NF1, NF2, NTHL1, PALB2, PHOX2B, PMS2, POT1, PRKAR1A, PTCH1, PTEN, RAD51C, RAD51D, RB1, RECQL, RET, SDHA, SDHAF2, SDHB, SDHC, SDHD, SMAD4, SMARCA4, SMARCB1, SMARCE1, STK11, SUFU, TMEM127, TP53, TSC1, TSC2, VHL and XRCC2 (sequencing and deletion/duplication); EGFR, EGLN1, HOXB13, KIT, MITF, PDGFRA, POLD1, and POLE (sequencing only); EPCAM and GREM1 (deletion/duplication only).      CHIEF COMPLIANT: Follow-up on tamoxifen therapy and Prolia for osteoporosis  HISTORY OF PRESENT ILLNESS:   History of Present Illness   The patient, a breast cancer survivor, presents for a routine follow-up. She reports tolerating tamoxifen well, with the only side effect being hot flashes, which she has been experiencing since a hysterectomy at 67 years old. The hot flashes are described as sometimes mild and other times severe. The patient has been adherent to the medication regimen and has not noticed any new or concerning symptoms.  In addition to the breast cancer, the patient has osteoporosis and is on Prolia injections. She is due for her next injection today. 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         ALLERGIES:  is allergic to elemental sulfur, zometa [zoledronic acid], nitrofurantoin monohyd macro, bactrim [sulfamethoxazole-trimethoprim], and other.  MEDICATIONS:  Current Outpatient Medications  Medication Sig Dispense Refill   amLODipine (NORVASC) 5 MG tablet TAKE 1 TABLET (5 MG TOTAL) BY MOUTH DAILY. 90 tablet 2   Aspirin (VAZALORE) 81 MG CAPS Take 81 mg by mouth daily.     calcium carbonate (OS-CAL - DOSED IN MG OF ELEMENTAL CALCIUM) 1250 (500 Ca) MG tablet Take  1 tablet by mouth.     Cholecalciferol (D3 ADULT PO)      FIBER ADULT GUMMIES PO      losartan (COZAAR) 100 MG tablet TAKE 1 TABLET BY MOUTH EVERY DAY 90 tablet 3   Multiple Vitamin  (MULTIVITAMIN) tablet Take 1 tablet by mouth daily.     Omega-3 Fatty Acids (FISH OIL) 300 MG CAPS      tamoxifen (NOLVADEX) 20 MG tablet TAKE 1 TABLET BY MOUTH EVERY DAY 90 tablet 3   traZODone (DESYREL) 50 MG tablet TAKE 1/2 TO 1 TABLET BY MOUTH AT BEDTIME AS NEEDED FOR SLEEP 90 tablet 1   vitamin E 400 UNIT capsule Take 400 Units by mouth daily.     No current facility-administered medications for this visit.    PHYSICAL EXAMINATION: ECOG PERFORMANCE STATUS: 1 - Symptomatic but completely ambulatory  Vitals:   08/02/23 0849  BP: (!) 163/69  Pulse: 84  Resp: 18  Temp: 97.7 F (36.5 C)  SpO2: 100%   Filed Weights   08/02/23 0849  Weight: 174 lb 3.2 oz (79 kg)      LABORATORY DATA:  I have reviewed the data as listed    Latest Ref Rng & Units 08/02/2023    8:36 AM 01/29/2023    7:38 AM 07/31/2022    8:11 AM  CMP  Glucose 70 - 99 mg/dL 161  096  045   BUN 8 - 23 mg/dL 14  13  11    Creatinine 0.44 - 1.00 mg/dL 4.09  8.11  9.14   Sodium 135 - 145 mmol/L 140  143  140   Potassium 3.5 - 5.1 mmol/L 4.2  3.8  4.4   Chloride 98 - 111 mmol/L 107  109  106   CO2 22 - 32 mmol/L 28  28  29    Calcium 8.9 - 10.3 mg/dL 9.3  8.8  9.7   Total Protein 6.5 - 8.1 g/dL 6.8  7.3  7.4   Total Bilirubin <1.2 mg/dL 0.5  0.6  0.5   Alkaline Phos 38 - 126 U/L 40  49  62   AST 15 - 41 U/L 25  30  40   ALT 0 - 44 U/L 24  32  39     Lab Results  Component Value Date   WBC 6.1 08/02/2023   HGB 12.9 08/02/2023   HCT 39.6 08/02/2023   MCV 93.6 08/02/2023   PLT 221 08/02/2023   NEUTROABS 2.7 08/02/2023    ASSESSMENT & PLAN:  Malignant neoplasm of upper-outer quadrant of left breast in female, estrogen receptor positive (HCC) 11/04/2021: Screening mammogram detected possible distortion in the left breast initial biopsy was [facility], lumpectomy revealed grade 2 invasive lobular cancer with pleomorphic features measuring 5 mm size, margins negative, ER 100%, PR 95%, Ki-67 2%, HER2 negative (2+ by  IHC but FISH negative) 12/17/2021-01/13/2022: Adjuvant radiation   Current treatment: Adjuvant antiestrogen therapy with tamoxifen (because of osteoporosis) started 01/13/2022   Tamoxifen toxicities: Moderate to severe hot flashes Muscle cramps   Osteoporosis: T score -2.8 in 2019.  She had a severe reaction to Zometa requiring emergency room visit on 01/21/2022.  Today she will see Prolia injection.  Breast cancer surveillance: Mammogram 07/23/2023: Benign breast density category B Bone density 07/22/2023: T-score -2.7: Osteoporosis: Currently on Prolia along with calcium and vitamin D (bone density 2019: T-score -2.8)   Return to clinic every 6 months with labs and Prolia injections and once a year follow-up with  me     No orders of the defined types were placed in this encounter.  The patient has a good understanding of the overall plan. she agrees with it. she will call with any problems that may develop before the next visit here. Total time spent: 30 mins including face to face time and time spent for planning, charting and co-ordination of care   Tamsen Meek, MD 08/02/23

## 2023-08-02 NOTE — Assessment & Plan Note (Signed)
11/04/2021: Screening mammogram detected possible distortion in the left breast initial biopsy was [facility], lumpectomy revealed grade 2 invasive lobular cancer with pleomorphic features measuring 5 mm size, margins negative, ER 100%, PR 95%, Ki-67 2%, HER2 negative (2+ by IHC but FISH negative) 12/17/2021-01/13/2022: Adjuvant radiation   Current treatment: Adjuvant antiestrogen therapy with tamoxifen (because of osteoporosis) started 01/13/2022   Tamoxifen toxicities: Moderate to severe hot flashes Muscle cramps   Osteoporosis: T score -2.8 in 2019.  She had a severe reaction to Zometa requiring emergency room visit on 01/21/2022.  Today she will see Prolia injection.  Breast cancer surveillance: Breast exam 08/02/2023: Benign Mammogram 07/23/2023: Benign breast density category B Bone density 07/22/2023: T-score -2.7: Osteoporosis: Currently on Prolia along with calcium and vitamin D (bone density 2019: T-score -2.8)   Return to clinic every 6 months with labs and Prolia injections and once a year follow-up with me

## 2023-08-30 ENCOUNTER — Encounter: Payer: Self-pay | Admitting: Internal Medicine

## 2023-08-30 ENCOUNTER — Ambulatory Visit: Payer: Medicare HMO | Attending: Internal Medicine | Admitting: Internal Medicine

## 2023-08-30 VITALS — BP 138/82 | HR 80 | Resp 16 | Ht 67.0 in | Wt 175.8 lb

## 2023-08-30 DIAGNOSIS — E785 Hyperlipidemia, unspecified: Secondary | ICD-10-CM | POA: Diagnosis not present

## 2023-08-30 DIAGNOSIS — I517 Cardiomegaly: Secondary | ICD-10-CM | POA: Diagnosis not present

## 2023-08-30 DIAGNOSIS — E782 Mixed hyperlipidemia: Secondary | ICD-10-CM | POA: Diagnosis not present

## 2023-08-30 NOTE — Progress Notes (Signed)
  Cardiology Office Note:  .    Date:  08/30/2023  ID:  Laura [name], DOB 08/04/1956, MRN [id number] PCP: Kristian Covey, MD  [facility] Providers Cardiologist:  Christell Constant, MD     CC: Prevention visit Consulted for the evaluation of prevention at the behest of Dr. Caryl Never   History of Present Illness: .    Laura [name] is a 67 y.o. female with a history of HLD, LVH and white coat hypertension, and elevated Tgs.  Laura [name], with a history of hyperlipidemia and white coat hypertension, presents for an initial consultation to establish care. She reports feeling generally well, with no complaints of chest pain or dyspnea. She has been under significant stress due to caring for her mother who has stage 4 lung cancer, which has led to a less healthy diet and weight gain. She expresses a desire to improve her health through better diet and exercise, planning to hire a personal trainer at her local gym to help her navigate her physical limitations due to past back surgeries and a knee replacement. She has no new symptoms or changes in her health since her last cardiac evaluation approximately 14 years ago, which included stress testing, echocardiogram, and EKG, all of which were reportedly normal.     Relevant histories: .  Social: Married to [employer], who I see ROS: As per HPI.   Physical Exam:    VS:  BP 138/82 (BP Location: Left Arm, Patient Position: Sitting, Cuff Size: Normal)   Pulse 80   Resp 16   Ht 5\' 7"  (1.702 m)   Wt 175 lb 12.8 oz (79.7 kg)   SpO2 98%   BMI 27.53 kg/m    Wt Readings from Last 3 Encounters:  08/30/23 175 lb 12.8 oz (79.7 kg)  08/02/23 174 lb 3.2 oz (79 kg)  05/11/23 170 lb (77.1 kg)    Gen: no distress,  Cardiac: No Rubs or Gallops, no Murmur, RRR +2 radial pulses Respiratory: Clear to auscultation bilaterally, normal effort, normal  respiratory rate GI: Soft, nontender, non-distended  MS: No  edema;  moves all  extremities Integument: Skin feels warm Neuro:  At time of evaluation, alert and oriented to person/place/time/situation  Psych: Normal affect, patient feels well   ASSESSMENT AND PLAN: .    Hyperlipidemia Discussed importance of monitoring lipid levels and suggested coronary artery calcium score to assess need for aggressive treatment. If no calcium in arteries, annual check-ups will suffice. - Order coronary artery calcium score - Schedule fasting lipid panel and LP(a) test in six months  White Coat Hypertension LVH on 2011 echo with normal EKG - Exhibits elevated blood pressure readings in clinical settings, consistent with white coat hypertension. Home blood pressure readings are within normal limits. - Monitor blood pressure at home  Elevated TGS Emphasized cardiovascular health through exercise, particularly given history of back surgeries and knee replacement. Discussed starting with a personal trainer and considering water aerobics for low-impact exercise. Recommended focusing on exercise before dietary changes. - Engage in a structured exercise program with a personal trainer - Consider water aerobics for low-impact exercise - Focus on exercise before making dietary changes  Follow-up - Schedule follow-up appointment in November 2025.   Riley Lam, MD FASE [facility] Cardiologist [facility]  [address] [place], [address] [place], [place] [address] [phone]  5:19 PM

## 2023-08-30 NOTE — Patient Instructions (Signed)
Medication Instructions:  Your physician recommends that you continue on your current medications as directed. Please refer to the Current Medication list given to you today.  *If you need a refill on your cardiac medications before your next appointment, please call your pharmacy*   Lab Work: NONE If you have labs (blood work) drawn today and your tests are completely normal, you will receive your results only by: MyChart Message (if you have MyChart) OR A paper copy in the mail If you have any lab test that is abnormal or we need to change your treatment, we will call you to review the results.   Testing/Procedures: Your physician has ordered you to have non-invasive CT for Calcium Scoring of your heart. It will calculate your risk of developing Coronary Artery Disease (CAD) by measuring the amount of buildup of calcium in the plaque in the coronary arteries (arteries surrounding your heart).     Follow-Up: At [facility], you and your health needs are our priority.  As part of our continuing mission to provide you with exceptional heart care, we have created designated Provider Care Teams.  These Care Teams include your primary Cardiologist (physician) and Advanced Practice Providers (APPs -  Physician Assistants and Nurse Practitioners) who all work together to provide you with the care you need, when you need it.   Your next appointment:   11 month(s)  Provider:   Christell Constant, MD

## 2023-09-09 ENCOUNTER — Other Ambulatory Visit: Payer: Self-pay | Admitting: Family Medicine

## 2023-09-13 ENCOUNTER — Ambulatory Visit (INDEPENDENT_AMBULATORY_CARE_PROVIDER_SITE_OTHER): Payer: Medicare HMO | Admitting: Family Medicine

## 2023-09-13 ENCOUNTER — Encounter: Payer: Self-pay | Admitting: Family Medicine

## 2023-09-13 VITALS — BP 148/70 | HR 80 | Temp 97.8°F | Ht 68.11 in | Wt 172.7 lb

## 2023-09-13 DIAGNOSIS — R7302 Impaired glucose tolerance (oral): Secondary | ICD-10-CM | POA: Diagnosis not present

## 2023-09-13 DIAGNOSIS — Z0000 Encounter for general adult medical examination without abnormal findings: Secondary | ICD-10-CM

## 2023-09-13 DIAGNOSIS — E785 Hyperlipidemia, unspecified: Secondary | ICD-10-CM | POA: Diagnosis not present

## 2023-09-13 LAB — LIPID PANEL
Cholesterol: 149 mg/dL (ref 0–200)
HDL: 38.4 mg/dL — ABNORMAL LOW (ref >39.00)
LDL Cholesterol: 49 mg/dL (ref 0–99)
NonHDL: 110.7
Total CHOL/HDL Ratio: 4
Triglycerides: 309 mg/dL — ABNORMAL HIGH (ref 0.0–149.0)
VLDL: 61.8 mg/dL — ABNORMAL HIGH (ref 0.0–40.0)

## 2023-09-13 LAB — HEMOGLOBIN A1C: Hgb A1c MFr Bld: 6 % (ref 4.6–6.5)

## 2023-09-13 MED ORDER — AMLODIPINE BESYLATE 5 MG PO TABS: 5.0000 mg | ORAL_TABLET | Freq: Every day | ORAL | 3 refills | Status: DC

## 2023-09-13 MED ORDER — LOSARTAN POTASSIUM 100 MG PO TABS: 100.0000 mg | ORAL_TABLET | Freq: Every day | ORAL | 3 refills | Status: DC

## 2023-09-13 MED ORDER — TRAZODONE HCL 50 MG PO TABS: 25.0000 mg | ORAL_TABLET | Freq: Every evening | ORAL | 1 refills | Status: DC | PRN

## 2023-09-13 NOTE — Progress Notes (Signed)
Established Patient Office Visit  Subjective   Patient ID: Laura [name], female    DOB: 1955/11/01  Age: 67 y.o. MRN: [id number]  Chief Complaint  Patient presents with   Annual Exam    HPI   Candis is seen for physical exam.  She has history of breast cancer, hypertension, dyslipidemia, GERD, impaired glucose tolerance, osteoarthritis.  She recently initiated care with cardiology and has pending coronary calcium score study.  Her mom has longstanding history of diabetes.  Ascencion has had several years of prediabetes range blood sugars.  She states that she stress eats frequently.  Tries to watch refined sugars.  No polyuria or polydipsia recently  She has hypertension treated with amlodipine and losartan.  Needs refills.  She states she has "whitecoat syndrome ".  She had recent blood pressure at gynecologist though that was 122/70.  She states her blood pressures are much better controlled at home than they are today here and generally 120s systolic and 70s diastolic.  She does take trazodone infrequently for insomnia.  Requesting refills..  Had recent DEXA scan through oncology and states this was favorable.  Health maintenance reviewed:  Health Maintenance  Topic Date Due   Medicare Annual Wellness (AWV)  Never done   Zoster Vaccines- Shingrix (1 of 2) 11/09/1975   COVID-19 Vaccine (5 - 2024-25 season) 05/16/2023   DTaP/Tdap/Td (2 - Td or Tdap) 06/22/2024   MAMMOGRAM  07/22/2025   Colonoscopy  05/10/2033   Pneumonia Vaccine 28+ Years old  Completed   INFLUENZA VACCINE  Completed   DEXA SCAN  Completed   Hepatitis C Screening  Completed   HPV VACCINES  Aged Out   -Has had prior Zostavax but not Shingrix.  She is considering getting this this year.  Family History  Problem Relation Age of Onset   Diabetes Mother    Thyroid disease Mother        hypothyroidism   Lung cancer Mother        no smoking hx   Heart disease Father 17       cad   Other Brother         benign brain mass in late 53s   Pancreatic cancer Maternal Aunt        dx and d. late 19s   Cancer Maternal Aunt        unknown GYN; mets; dx and d. 81s   Thyroid cancer Maternal Aunt        mets; dx and d. 48s   Colon cancer Neg Hx    Colon polyps Neg Hx    Esophageal cancer Neg Hx    Stomach cancer Neg Hx    Rectal cancer Neg Hx    Breast cancer Neg Hx     Social History   Socioeconomic History   Marital status: Married    Spouse name: Not on file   Number of children: Not on file   Years of education: Not on file   Highest education level: Not on file  Occupational History   Not on file  Tobacco Use   Smoking status: Never   Smokeless tobacco: Never  Vaping Use   Vaping status: Never Used  Substance and Sexual Activity   Alcohol use: No   Drug use: No   Sexual activity: Yes    Birth control/protection: Post-menopausal  Other Topics Concern   Not on file  Social History Narrative   Pt does not get regular exercise.   Daily  Caffeine Use (12 oz Coke Zero per day)   Social Drivers of Corporate investment banker Strain: Not on file  Food Insecurity: Not on file  Transportation Needs: Not on file  Physical Activity: Not on file  Stress: Not on file  Social Connections: Not on file  Intimate Partner Violence: Not on file   Social history-married.  1 son.  He is not married.  No grandchildren.  She retired 4 years ago.  Never smoked.  No alcohol.  Husband also retired.   Past Medical History:  Diagnosis Date   ABNORMAL ELECTROCARDIOGRAM 08/05/2010   02-19-15 EKG shows NSR. Pt voices no issues with heart related problems.   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   GERD 01/25/2008   uses Prevacid as needed   History of radiation therapy    Left Breast- 12/16/21-01/13/22- Dr. 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   Tendonitis    right wrist-has soft splint in place.   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   VENTRICULAR HYPERTROPHY, LEFT 09/23/2010   Wears glasses    Past Surgical History:  Procedure Laterality Date   ABDOMINAL HYSTERECTOMY  2002   TAH-LTSO-LOA   ARTHROSCOPIC REPAIR ACL Right 10/1998   right knee    BACK SURGERY  2019   BREAST EXCISIONAL BIOPSY Left 2015   benign   BREAST LUMPECTOMY     BREAST LUMPECTOMY Left 10/2021   BREAST SURGERY Left    lumpectomy-benign 2015   CARDIOVASCULAR STRESS TEST  2011   CARPAL TUNNEL RELEASE  2014   left   CHOLECYSTECTOMY  1985   open   COLONOSCOPY     COLONOSCOPY W/ POLYPECTOMY     DILATION AND CURETTAGE OF UTERUS     ELBOW ARTHROPLASTY  2014   left   ESOPHAGOGASTRODUODENOSCOPY  04/01/1998   KNEE ARTHROSCOPY Bilateral    meniscus tear-left.   NASAL SINUS SURGERY     s/p   ORIF ELBOW FRACTURE  12/23/2011   Procedure: OPEN REDUCTION INTERNAL FIXATION (ORIF) ELBOW/OLECRANON FRACTURE;  Surgeon: Sharma Covert, MD;  Location: [facility] OR;  Service: Orthopedics;  Laterality: Left;  LEFT ELBOW ORIF OF PROXIMAL OLECRANON AND RADIAL HEAD, POSSIBLE ARTHROPLASTY   OVARIAN CYST REMOVAL      RADIOACTIVE SEED GUIDED EXCISIONAL BREAST BIOPSY Left 11/04/2021   Procedure: RADIOACTIVE SEED GUIDED EXCISIONAL LEFT BREAST BIOPSY;  Surgeon: Emelia Loron, MD;  Location: [facility];  Service: General;  Laterality: Left;   TOTAL KNEE ARTHROPLASTY Right 06/24/2015  Procedure: RIGHT TOTAL KNEE ARTHROPLASTY;  Surgeon: Ollen Gross, MD;  Location: [facility];  Service: Orthopedics;  Laterality: Right;   TRANSFORAMINAL LUMBAR INTERBODY FUSION (TLIF) WITH PEDICLE SCREW FIXATION 1 LEVEL Left 03/03/2019   Procedure: Left Lumbar four-five Redo Microdiscectomy with transforaminal lumbar interbody fusion;  Surgeon: Maeola Harman, MD;  Location: [facility] OR;  Service: Neurosurgery;  Laterality: Left;   UPPER GASTROINTESTINAL ENDOSCOPY     US ECHOCARDIOGRAPHY  2011    reports that she has never smoked. She has never used smokeless tobacco. She reports that she does not drink alcohol and does not use drugs. family history includes Cancer in her maternal aunt; Diabetes in her mother; Heart disease (age of onset: 16) in her father; Lung cancer in her mother; Other in her brother; Pancreatic cancer in her maternal aunt; Thyroid cancer in her maternal aunt; Thyroid disease in her mother. Allergies  Allergen Reactions   Elemental Sulfur Hives   Zometa [Zoledronic Acid] Other (See Comments)    Fever, chills, required ED visit   Nitrofurantoin Monohyd Macro Other (See Comments)    Chills and eye pain.  Overall flu type symptoms   Bactrim [Sulfamethoxazole-Trimethoprim] Rash   Other Rash    dermabond     Review of Systems  Constitutional:  Negative for malaise/fatigue.  Eyes:  Negative for blurred vision.  Respiratory:  Negative for shortness of breath.   Cardiovascular:  Negative for chest pain.  Neurological:  Negative for dizziness, weakness and headaches.      Objective:     BP (!) 148/70 (BP Location: Left Arm, Patient Position: Sitting, Cuff Size: Normal)   Pulse 80   Temp 97.8 F  (36.6 C) (Oral)   Ht 5' 8.11" (1.73 m)   Wt 172 lb 11.2 oz (78.3 kg)   SpO2 98%   BMI 26.17 kg/m  BP Readings from Last 3 Encounters:  09/13/23 (!) 148/70  08/30/23 138/82  08/02/23 (!) 163/69   Wt Readings from Last 3 Encounters:  09/13/23 172 lb 11.2 oz (78.3 kg)  08/30/23 175 lb 12.8 oz (79.7 kg)  08/02/23 174 lb 3.2 oz (79 kg)      Physical Exam Vitals reviewed.  Constitutional:      General: She is not in acute distress.    Appearance: She is well-developed.  HENT:     Head: Normocephalic and atraumatic.  Eyes:     Pupils: Pupils are equal, round, and reactive to light.  Neck:     Thyroid: No thyromegaly.  Cardiovascular:     Rate and Rhythm: Normal rate and regular rhythm.     Heart sounds: Normal heart sounds. No murmur heard. Pulmonary:     Effort: No respiratory distress.     Breath sounds: Normal breath sounds. No wheezing or rales.  Abdominal:     General: Bowel sounds are normal. There is no distension.     Palpations: Abdomen is soft. There is no mass.     Tenderness: There is no abdominal tenderness. There is no guarding or rebound.  Musculoskeletal:        General: Normal range of motion.     Cervical back: Normal range of motion and neck supple.     Right lower leg: No edema.     Left lower leg: No edema.  Lymphadenopathy:     Cervical: No cervical adenopathy.  Skin:    Findings: No rash.  Neurological:     Mental Status: She is alert and oriented to person, place, and  time.     Cranial Nerves: No cranial nerve deficit.  Psychiatric:        Behavior: Behavior normal.        Thought Content: Thought content normal.        Judgment: Judgment normal.      No results found for any visits on 09/13/23.  Last CBC Lab Results  Component Value Date   WBC 6.1 08/02/2023   HGB 12.9 08/02/2023   HCT 39.6 08/02/2023   MCV 93.6 08/02/2023   MCH 30.5 08/02/2023   RDW 13.0 08/02/2023   PLT 221 08/02/2023   Last metabolic panel Lab Results   Component Value Date   GLUCOSE 110 (H) 08/02/2023   NA 140 08/02/2023   K 4.2 08/02/2023   CL 107 08/02/2023   CO2 28 08/02/2023   BUN 14 08/02/2023   CREATININE 0.61 08/02/2023   GFRNONAA >60 08/02/2023   CALCIUM 9.3 08/02/2023   PROT 6.8 08/02/2023   ALBUMIN 4.2 08/02/2023   BILITOT 0.5 08/02/2023   ALKPHOS 40 08/02/2023   AST 25 08/02/2023   ALT 24 08/02/2023   ANIONGAP 5 08/02/2023      The 10-year ASCVD risk score (Arnett DK, et al., 2019) is: 10.7%    Assessment & Plan:   Problem List Items Addressed This Visit       Unprioritized   Hyperlipidemia   Relevant Medications   amLODipine (NORVASC) 5 MG tablet   losartan (COZAAR) 100 MG tablet   Other Relevant Orders   Lipid panel   Impaired glucose tolerance   Relevant Orders   Hemoglobin A1c   Other Visit Diagnoses       Physical exam    -  Primary     Abagail is here for physical exam.  We discussed the following health maintenance items  -Flu vaccine already given -Discussed Shingrix vaccine and she will consider at some point in the next year -She is getting regular mammograms through oncology -Colonoscopy up-to-date -Recent DEXA scan -Pneumonia vaccine completed -Had recent CBC and CMP through oncology and these were not repeated.  She does have history of hyperlipidemia and prediabetes range blood sugars and will check A1c and fasting lipid panel.  Discussed low glycemic diet and also importance of good natural sources omega-3's to help combat her hypertriglyceridemia -She has scheduled coronary calcium score through cardiology which she will help further risk stratify -Refills blood pressure medication given.  Blood pressure was up a bit today but she states this is atypical for her.  Recommend close monitoring over the next several weeks and be in touch if consistently over 140 systolic.  We have challenged her to try to lose some weight which should help her blood pressure as well  No follow-ups on  file.    Evelena Peat, MD

## 2023-09-13 NOTE — Patient Instructions (Signed)
Monitor blood pressure at home and be in touch if consistently > 140 systolic (top number)  Consider Shingrix vaccine at some point this next year.

## 2023-09-21 ENCOUNTER — Ambulatory Visit (HOSPITAL_BASED_OUTPATIENT_CLINIC_OR_DEPARTMENT_OTHER): Payer: Medicare HMO

## 2023-10-11 ENCOUNTER — Ambulatory Visit (HOSPITAL_BASED_OUTPATIENT_CLINIC_OR_DEPARTMENT_OTHER)
Admission: RE | Admit: 2023-10-11 | Discharge: 2023-10-11 | Disposition: A | Discharge status: Home / Self Care | Payer: Self-pay | Source: Ambulatory Visit | Attending: Internal Medicine | Admitting: Internal Medicine

## 2023-10-11 DIAGNOSIS — E785 Hyperlipidemia, unspecified: Secondary | ICD-10-CM | POA: Insufficient documentation

## 2023-10-12 ENCOUNTER — Encounter: Payer: Self-pay | Admitting: Internal Medicine

## 2023-10-15 ENCOUNTER — Other Ambulatory Visit: Payer: Self-pay

## 2023-10-15 ENCOUNTER — Emergency Department (HOSPITAL_BASED_OUTPATIENT_CLINIC_OR_DEPARTMENT_OTHER)
Admission: EM | Admit: 2023-10-15 | Discharge: 2023-10-15 | Disposition: A | Discharge status: Home / Self Care | Payer: Medicare HMO | Attending: Emergency Medicine | Admitting: Emergency Medicine

## 2023-10-15 ENCOUNTER — Emergency Department (HOSPITAL_BASED_OUTPATIENT_CLINIC_OR_DEPARTMENT_OTHER): Payer: Medicare HMO

## 2023-10-15 DIAGNOSIS — R0602 Shortness of breath: Secondary | ICD-10-CM | POA: Insufficient documentation

## 2023-10-15 DIAGNOSIS — R079 Chest pain, unspecified: Secondary | ICD-10-CM | POA: Diagnosis not present

## 2023-10-15 DIAGNOSIS — R072 Precordial pain: Secondary | ICD-10-CM | POA: Insufficient documentation

## 2023-10-15 DIAGNOSIS — Z7982 Long term (current) use of aspirin: Secondary | ICD-10-CM | POA: Diagnosis not present

## 2023-10-15 DIAGNOSIS — J45909 Unspecified asthma, uncomplicated: Secondary | ICD-10-CM | POA: Diagnosis not present

## 2023-10-15 DIAGNOSIS — R0789 Other chest pain: Secondary | ICD-10-CM | POA: Diagnosis not present

## 2023-10-15 LAB — BASIC METABOLIC PANEL
Anion gap: 11 (ref 5–15)
BUN: 12 mg/dL (ref 8–23)
CO2: 25 mmol/L (ref 22–32)
Calcium: 9.1 mg/dL (ref 8.9–10.3)
Chloride: 104 mmol/L (ref 98–111)
Creatinine, Ser: 0.63 mg/dL (ref 0.44–1.00)
GFR, Estimated: >60 mL/min (ref >60)
Glucose, Bld: 208 mg/dL — ABNORMAL HIGH (ref 70–99)
Potassium: 3.7 mmol/L (ref 3.5–5.1)
Sodium: 140 mmol/L (ref 135–145)

## 2023-10-15 LAB — CBC
HCT: 40.3 % (ref 36.0–46.0)
Hemoglobin: 13.2 g/dL (ref 12.0–15.0)
MCH: 30.6 pg (ref 26.0–34.0)
MCHC: 32.8 g/dL (ref 30.0–36.0)
MCV: 93.3 fL (ref 80.0–100.0)
Platelets: 259 10*3/uL (ref 150–400)
RBC: 4.32 MIL/uL (ref 3.87–5.11)
RDW: 13.2 % (ref 11.5–15.5)
WBC: 6.7 10*3/uL (ref 4.0–10.5)
nRBC: 0 % (ref 0.0–0.2)

## 2023-10-15 LAB — TROPONIN I (HIGH SENSITIVITY)
Troponin I (High Sensitivity): <2 ng/L (ref <18)
Troponin I (High Sensitivity): 2 ng/L (ref <18)

## 2023-10-15 MED ORDER — CYCLOBENZAPRINE HCL 10 MG PO TABS
10.0000 mg | ORAL_TABLET | Freq: Two times a day (BID) | ORAL | 0 refills | Status: DC | PRN
Start: 2023-10-15 — End: 2023-10-19

## 2023-10-15 MED ORDER — KETOROLAC TROMETHAMINE 15 MG/ML IJ SOLN
15.0000 mg | Freq: Once | INTRAMUSCULAR | Status: AC
Start: 2023-10-15 — End: 2023-10-15
  Administered 2023-10-15: 15 mg via INTRAVENOUS
  Filled 2023-10-15: qty 1

## 2023-10-15 NOTE — ED Triage Notes (Signed)
Pt POV from home reporting mid chest pain that began 30 min ago, reports muscle spasms in flank area that have improved with muscle relaxers, thinks this is what it is. Denies SOB.

## 2023-10-15 NOTE — ED Notes (Signed)
 Discharge paperwork given and verbally understood.

## 2023-10-15 NOTE — ED Provider Notes (Signed)
[place] EMERGENCY DEPARTMENT AT [facility] Provider Note   CSN: [id number] Arrival date & time: 10/15/23  1623     History  Chief Complaint  Patient presents with   Chest Pain   Shortness of Breath    Laura [name] is a 68 y.o. female.  Patient is a 68 year old female with past medical history of ALH of the breast, asthma, hyperlipidemia, and GERD presenting for complaints of chest pain.  Patient admits to intermittent and chronic bilateral muscle spasms in her ribs but states she developed left sternal chest pain today and it concerned her.  She states the pain was severe and associated with shortness of breath.  Denies nausea, diaphoresis, or radiation.  States that it improved after Flexeril.  Denies prior history of myocardial infarction or catheterization procedures.  The history is provided by the patient. No language interpreter was used.  Chest Pain Associated symptoms: shortness of breath   Associated symptoms: no abdominal pain, no back pain, no cough, no fever, no palpitations and no vomiting   Shortness of Breath Associated symptoms: chest pain   Associated symptoms: no abdominal pain, no cough, no ear pain, no fever, no rash, no sore throat and no vomiting        Home Medications Prior to Admission medications   Medication Sig Start Date End Date Taking? Authorizing Provider  amLODipine (NORVASC) 5 MG tablet Take 1 tablet (5 mg total) by mouth daily. 09/13/23   Burchette, Elberta Fortis, MD  Aspirin (VAZALORE) 81 MG CAPS Take 81 mg by mouth daily.    [provider]  calcium carbonate (OS-CAL - DOSED IN MG OF ELEMENTAL CALCIUM) 1250 (500 Ca) MG tablet Take 1 tablet by mouth.    [provider]  Cholecalciferol (D3 ADULT PO)     [provider]  FIBER ADULT GUMMIES PO     [provider]  losartan (COZAAR) 100 MG tablet Take 1 tablet (100 mg total) by mouth daily. 09/13/23   Burchette, Elberta Fortis, MD  Multiple Vitamin  (MULTIVITAMIN) tablet Take 1 tablet by mouth daily.    [provider]  Omega-3 Fatty Acids (FISH OIL) 300 MG CAPS     [provider]  tamoxifen (NOLVADEX) 20 MG tablet TAKE 1 TABLET BY MOUTH EVERY DAY 04/14/23   Serena Croissant, MD  traZODone (DESYREL) 50 MG tablet Take 0.5-1 tablets (25-50 mg total) by mouth at bedtime as needed. for sleep 09/13/23   Kristian Covey, MD  vitamin E 400 UNIT capsule Take 400 Units by mouth daily.    [provider]  olmesartan (BENICAR) 20 MG tablet Take 20 mg by mouth daily.    11/30/11  [provider]      Allergies    Elemental sulfur, Zometa [zoledronic acid], Nitrofurantoin monohyd macro, Bactrim [sulfamethoxazole-trimethoprim], and Other    Review of Systems   Review of Systems  Constitutional:  Negative for chills and fever.  HENT:  Negative for ear pain and sore throat.   Eyes:  Negative for pain and visual disturbance.  Respiratory:  Positive for shortness of breath. Negative for cough.   Cardiovascular:  Positive for chest pain. Negative for palpitations.  Gastrointestinal:  Negative for abdominal pain and vomiting.  Genitourinary:  Negative for dysuria and hematuria.  Musculoskeletal:  Negative for arthralgias and back pain.  Skin:  Negative for color change and rash.  Neurological:  Negative for seizures and syncope.  All other systems reviewed and are negative.  Physical Exam Updated Vital Signs BP 122/70   Pulse 85   Temp 98.8 F (37.1 C) (Oral)   Resp 17   Ht 5\' 7"  (1.702 m)   Wt 76.7 kg   SpO2 97%   BMI 26.47 kg/m  Physical Exam Vitals and nursing note reviewed.  Constitutional:      General: She is not in acute distress.    Appearance: She is well-developed.  HENT:     Head: Normocephalic and atraumatic.  Eyes:     Conjunctiva/sclera: Conjunctivae normal.  Cardiovascular:     Rate and Rhythm: Normal rate and regular rhythm.     Heart sounds: No murmur heard. Pulmonary:      Effort: Pulmonary effort is normal. No respiratory distress.     Breath sounds: Normal breath sounds.  Abdominal:     Palpations: Abdomen is soft.     Tenderness: There is no abdominal tenderness.  Musculoskeletal:        General: No swelling.     Cervical back: Neck supple.     Right lower leg: No edema.     Left lower leg: No edema.  Skin:    General: Skin is warm and dry.     Capillary Refill: Capillary refill takes less than 2 seconds.  Neurological:     Mental Status: She is alert.  Psychiatric:        Mood and Affect: Mood normal.     ED Results / Procedures / Treatments   Labs (all labs ordered are listed, but only abnormal results are displayed) Labs Reviewed  BASIC METABOLIC PANEL - Abnormal; Notable for the following components:      Result Value   Glucose, Bld 208 (*)    All other components within normal limits  CBC  TROPONIN I (HIGH SENSITIVITY)  TROPONIN I (HIGH SENSITIVITY)    EKG EKG Interpretation Date/Time:  Friday October 15 2023 16:34:02 EST Ventricular Rate:  98 PR Interval:  132 QRS Duration:  76 QT Interval:  360 QTC Calculation: 459 R Axis:   45  Text Interpretation: Normal sinus rhythm Cannot rule out Anterior infarct , age undetermined Abnormal ECG When compared with ECG of 30-Aug-2023 15:25, Minimal criteria for Anterior infarct are now Present Confirmed by Edwin Dada ([id number]) on 10/15/2023 5:45:18 PM  Radiology DG Chest Port 1 View Result Date: 10/15/2023 CLINICAL DATA:  Chest pain EXAM: PORTABLE CHEST 1 VIEW COMPARISON:  01/22/2022 FINDINGS: Heart size and mediastinal contours are normal. No pleural effusion or interstitial edema. No airspace consolidation identified. The visualized osseous structures appear intact. IMPRESSION: No active disease. Electronically Signed   By: Signa Kell M.D.   On: 10/15/2023 18:50    Procedures Procedures    Medications Ordered in ED Medications  ketorolac (TORADOL) 15 MG/ML injection 15 mg (15 mg  Intravenous Given 10/15/23 1906)    ED Course/ Medical Decision Making/ A&P                                 Medical Decision Making Amount and/or Complexity of Data Reviewed Labs: ordered. Radiology: ordered.  Risk Prescription drug management.   2:48 PM 68 year old female with past medical history of ALH of the breast, asthma, hyperlipidemia, and GERD presenting for complaints of chest pain.  Patient is alert and oriented x 3, no acute distress, afebrile, stable vital signs.  She has equal bilateral breath sounds with no adventitious lung sounds.  No  signs of respiratory distress.  No hypoxia.  No lower extremity edema.  EKG completed on October 14, 2023 and interpreted by myself demonstrates normal sinus rhythm.  No ST segment elevation or depression.  Upright T waves.  Some QTc prolongation.  Otherwise stable.  The patient's chest pain is not suggestive of pulmonary embolus, cardiac ischemia, aortic dissection, pericarditis, myocarditis, pulmonary embolism, pneumothorax, pneumonia, Zoster, or esophageal perforation, or other serious etiology.  Historically not abrupt in onset, tearing or ripping, pulses symmetric. EKG nonspecific for ischemia/infarction. No dysrhythmias, brugada, WPW, prolonged QT noted. CXR reviewed and WNL. Troponin negative x2. CXR reviewed. Labs without demonstration of acute pathology unless otherwise noted above.   Low HEART Score: 0-3 points (0.9-1.7% risk of MACE).] Given the extremely low risk of these diagnoses further testing and evaluation for these possibilities does not appear to be indicated at this time.   Patient in no distress and overall condition improved here in the ED. Detailed discussions were had with the patient regarding current findings, and need for close f/u with PCP or on call doctor. The patient has been instructed to return immediately if the symptoms worsen in any way for re-evaluation. Patient verbalized understanding and is in agreement  with current care plan. All questions answered prior to discharge.         Final Clinical Impression(s) / ED Diagnoses Final diagnoses:  Chest pain, unspecified type    Rx / DC Orders ED Discharge Orders     None         Franne Forts, DO 10/15/23 2024

## 2023-10-19 ENCOUNTER — Ambulatory Visit (INDEPENDENT_AMBULATORY_CARE_PROVIDER_SITE_OTHER): Payer: Medicare HMO | Admitting: Family Medicine

## 2023-10-19 ENCOUNTER — Encounter: Payer: Self-pay | Admitting: Family Medicine

## 2023-10-19 VITALS — BP 120/80 | HR 103 | Temp 98.1°F | Ht 67.0 in | Wt 171.8 lb

## 2023-10-19 DIAGNOSIS — R0789 Other chest pain: Secondary | ICD-10-CM

## 2023-10-19 MED ORDER — CYCLOBENZAPRINE HCL 10 MG PO TABS: 10.0000 mg | ORAL_TABLET | Freq: Three times a day (TID) | ORAL | 0 refills | Status: AC | PRN

## 2023-10-19 NOTE — Progress Notes (Signed)
 Established Patient Office Visit  Subjective   Patient ID: Laura [name], female    DOB: Apr 17, 1956  Age: 68 y.o. MRN: [id number]  Chief Complaint  Patient presents with   Hospitalization Follow-up    Chest pain and SOB seen in the hospital 10/15/23, EKG done at that time given flexeril , patient has been having muscle  spasms the week before,     HPI   Laura [name] is seen for ER follow-up.  She had recent sensation of muscle spasm in her right back and took some leftover Flexeril .  Symptoms promptly improved. Then last Friday she developed some symptoms on the left side with radiation toward the left chest region.  She and her husband had just been out to eat at 3 PM and pain started just prior to her eating.  She described this as a sharp knifelike pain.  She had some mild dyspnea.  She went to ER at [facility] and workup there was unremarkable.  She had chest x-ray and EKG.  No acute findings.  Troponin is negative.  Glucose 208 but this was nonfasting and was about an hour postprandial.  She had recent coronary calcium  score of 0 on 27 January.  She was given Toradol  in the ER and noticed prompt improvement in her discomfort.  She has not had any episodes since then.  She feels like this is muscle spasm related and has responded to Flexeril  previously for similar type discomfort.  Denies any recent exertional symptoms.  No cough.  No fevers or chills.  No recent skin rash.  No GERD symptoms.  Past Medical History:  Diagnosis Date   ABNORMAL ELECTROCARDIOGRAM 08/05/2010   02-19-15 EKG shows NSR. Pt voices no issues with heart related problems.   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   GERD 01/25/2008   uses Prevacid  as needed   History of radiation therapy    Left Breast- 12/16/21-01/13/22- Dr. 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   Tendonitis    right wrist-has soft splint in place.   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   VENTRICULAR HYPERTROPHY, LEFT 09/23/2010   Wears glasses    Past Surgical History:  Procedure Laterality Date   ABDOMINAL HYSTERECTOMY  2002   TAH-LTSO-LOA   ARTHROSCOPIC REPAIR ACL Right 10/1998   right knee    BACK SURGERY  2019   BREAST EXCISIONAL BIOPSY Left 2015   benign   BREAST LUMPECTOMY     BREAST LUMPECTOMY Left 10/2021   BREAST SURGERY Left  lumpectomy-benign 2015   CARDIOVASCULAR STRESS TEST  2011   CARPAL TUNNEL RELEASE  2014   left   CHOLECYSTECTOMY  1985   open   COLONOSCOPY     COLONOSCOPY W/ POLYPECTOMY     DILATION AND CURETTAGE OF UTERUS     ELBOW ARTHROPLASTY  2014   left   ESOPHAGOGASTRODUODENOSCOPY  04/01/1998   KNEE ARTHROSCOPY Bilateral    meniscus tear-left.   NASAL SINUS SURGERY     s/p   ORIF ELBOW FRACTURE  12/23/2011   Procedure: OPEN REDUCTION INTERNAL FIXATION (ORIF) ELBOW/OLECRANON FRACTURE;  Surgeon: Prentice LELON Pagan, MD;  Location: [facility] OR;  Service: Orthopedics;  Laterality: Left;  LEFT ELBOW ORIF OF PROXIMAL OLECRANON AND RADIAL HEAD, POSSIBLE ARTHROPLASTY   OVARIAN CYST REMOVAL      RADIOACTIVE SEED GUIDED EXCISIONAL BREAST BIOPSY Left 11/04/2021   Procedure: RADIOACTIVE SEED GUIDED EXCISIONAL LEFT BREAST BIOPSY;  Surgeon: Ebbie Cough, MD;  Location:  SURGERY CENTER;  Service: General;  Laterality: Left;   TOTAL KNEE ARTHROPLASTY Right 06/24/2015   Procedure: RIGHT TOTAL KNEE ARTHROPLASTY;  Surgeon: Dempsey Moan, MD;  Location: [facility];  Service: Orthopedics;  Laterality: Right;   TRANSFORAMINAL LUMBAR INTERBODY FUSION (TLIF) WITH PEDICLE SCREW FIXATION 1 LEVEL Left 03/03/2019   Procedure: Left Lumbar four-five Redo Microdiscectomy with transforaminal lumbar interbody fusion;  Surgeon: Unice Pac, MD;  Location: [facility] OR;  Service: Neurosurgery;  Laterality: Left;   UPPER GASTROINTESTINAL ENDOSCOPY     US  ECHOCARDIOGRAPHY  2011    reports that she has never smoked. She has never used smokeless tobacco. She reports that she does not drink alcohol and does not use drugs. family history includes Cancer in her maternal aunt; Diabetes in her mother; Heart disease (age of onset: 36) in her father; Lung cancer in her mother; Other in her brother; Pancreatic cancer in her maternal aunt; Thyroid cancer in her maternal aunt; Thyroid disease in her mother. Allergies  Allergen Reactions   Elemental Sulfur Hives   Zometa  [Zoledronic  Acid] Other (See Comments)    Fever, chills, required ED visit   Nitrofurantoin  Monohyd Macro Other (See Comments)    Chills and eye pain.  Overall flu type symptoms   Bactrim  [Sulfamethoxazole -Trimethoprim ] Rash   Other Rash    dermabond    Review of Systems  Constitutional:  Negative for chills and fever.  HENT:  Negative for sore throat.   Respiratory:  Negative for cough, hemoptysis, sputum production, shortness of breath and wheezing.   Cardiovascular:  Negative for palpitations, leg swelling and PND.       See HPI  Neurological:  Negative for dizziness and loss of consciousness.      Objective:     BP 120/80 (BP  Location: Right Arm, Patient Position: Sitting, Cuff Size: Normal)   Pulse (!) 103   Temp 98.1 F (36.7 C) (Oral)   Ht 5' 7 (1.702 m)   Wt 171 lb 12.8 oz (77.9 kg)   SpO2 98%   BMI 26.91 kg/m  BP Readings from Last 3 Encounters:  10/19/23 120/80  10/15/23 122/70  09/13/23 (!) 148/70   Wt Readings from Last 3 Encounters:  10/19/23 171 lb 12.8 oz (77.9 kg)  10/15/23 169 lb (76.7 kg)  09/13/23 172 lb 11.2 oz (78.3 kg)      Physical Exam Vitals reviewed.  Constitutional:      General: She is not in acute distress.    Appearance: She is not ill-appearing.  Cardiovascular:  Rate and Rhythm: Normal rate and regular rhythm.  Pulmonary:     Effort: Pulmonary effort is normal.     Breath sounds: Normal breath sounds. No wheezing or rales.  Neurological:     Mental Status: She is alert.      No results found for any visits on 10/19/23.  Last CBC Lab Results  Component Value Date   WBC 6.7 10/15/2023   HGB 13.2 10/15/2023   HCT 40.3 10/15/2023   MCV 93.3 10/15/2023   MCH 30.6 10/15/2023   RDW 13.2 10/15/2023   PLT 259 10/15/2023   Last metabolic panel Lab Results  Component Value Date   GLUCOSE 208 (H) 10/15/2023   NA 140 10/15/2023   K 3.7 10/15/2023   CL 104 10/15/2023   CO2 25 10/15/2023   BUN 12 10/15/2023   CREATININE 0.63 10/15/2023   GFRNONAA >60 10/15/2023   CALCIUM  9.1 10/15/2023   PROT 6.8 08/02/2023   ALBUMIN 4.2 08/02/2023   BILITOT 0.5 08/02/2023   ALKPHOS 40 08/02/2023   AST 25 08/02/2023   ALT 24 08/02/2023   ANIONGAP 11 10/15/2023   Last lipids Lab Results  Component Value Date   CHOL 149 09/13/2023   HDL 38.40 (L) 09/13/2023   LDLCALC 49 09/13/2023   LDLDIRECT 54.0 09/11/2022   TRIG 309.0 (H) 09/13/2023   CHOLHDL 4 09/13/2023   Last hemoglobin A1c Lab Results  Component Value Date   HGBA1C 6.0 09/13/2023      The 10-year ASCVD risk score (Arnett DK, et al., 2019) is: 7.3%    Assessment & Plan:   Atypical chest pain.   Presumed musculoskeletal.  ER workup reviewed.  Unremarkable.  She had recent coronary calcium  score of 0.  Suspect noncardiac source of pain.  She did respond to Toradol  and is also respond to Flexeril .  We did agree to refill her Flexeril  for as needed use for similar future symptoms.  Follow-up for any persistent or worsening symptoms.  30 minutes were spent between face-to-face and non-face-to-face time reviewing recent ER evaluation and symptoms  Wolm Scarlet, MD

## 2023-11-30 DIAGNOSIS — H43813 Vitreous degeneration, bilateral: Secondary | ICD-10-CM | POA: Diagnosis not present

## 2023-11-30 DIAGNOSIS — H524 Presbyopia: Secondary | ICD-10-CM | POA: Diagnosis not present

## 2023-11-30 DIAGNOSIS — H04123 Dry eye syndrome of bilateral lacrimal glands: Secondary | ICD-10-CM | POA: Diagnosis not present

## 2023-11-30 DIAGNOSIS — H52203 Unspecified astigmatism, bilateral: Secondary | ICD-10-CM | POA: Diagnosis not present

## 2023-11-30 DIAGNOSIS — H25043 Posterior subcapsular polar age-related cataract, bilateral: Secondary | ICD-10-CM | POA: Diagnosis not present

## 2023-11-30 DIAGNOSIS — H2513 Age-related nuclear cataract, bilateral: Secondary | ICD-10-CM | POA: Diagnosis not present

## 2023-12-17 DIAGNOSIS — M25512 Pain in left shoulder: Secondary | ICD-10-CM | POA: Diagnosis not present

## 2024-01-19 DIAGNOSIS — L738 Other specified follicular disorders: Secondary | ICD-10-CM | POA: Diagnosis not present

## 2024-01-19 DIAGNOSIS — L820 Inflamed seborrheic keratosis: Secondary | ICD-10-CM | POA: Diagnosis not present

## 2024-01-26 DIAGNOSIS — J3089 Other allergic rhinitis: Secondary | ICD-10-CM | POA: Diagnosis not present

## 2024-01-26 DIAGNOSIS — J301 Allergic rhinitis due to pollen: Secondary | ICD-10-CM | POA: Diagnosis not present

## 2024-01-26 DIAGNOSIS — J4540 Moderate persistent asthma, uncomplicated: Secondary | ICD-10-CM | POA: Diagnosis not present

## 2024-01-28 ENCOUNTER — Other Ambulatory Visit: Payer: Self-pay | Admitting: *Deleted

## 2024-01-28 DIAGNOSIS — C50412 Malignant neoplasm of upper-outer quadrant of left female breast: Secondary | ICD-10-CM

## 2024-01-31 ENCOUNTER — Inpatient Hospital Stay: Payer: Medicare HMO

## 2024-01-31 ENCOUNTER — Inpatient Hospital Stay: Payer: Medicare HMO | Attending: Internal Medicine

## 2024-01-31 VITALS — BP 155/70 | HR 73 | Temp 98.3°F | Resp 14

## 2024-01-31 DIAGNOSIS — Z1721 Progesterone receptor positive status: Secondary | ICD-10-CM | POA: Diagnosis not present

## 2024-01-31 DIAGNOSIS — Z1732 Human epidermal growth factor receptor 2 negative status: Secondary | ICD-10-CM | POA: Diagnosis not present

## 2024-01-31 DIAGNOSIS — M810 Age-related osteoporosis without current pathological fracture: Secondary | ICD-10-CM | POA: Diagnosis not present

## 2024-01-31 DIAGNOSIS — Z923 Personal history of irradiation: Secondary | ICD-10-CM | POA: Insufficient documentation

## 2024-01-31 DIAGNOSIS — Z170 Estrogen receptor positive status [ER+]: Secondary | ICD-10-CM | POA: Diagnosis not present

## 2024-01-31 DIAGNOSIS — C50412 Malignant neoplasm of upper-outer quadrant of left female breast: Secondary | ICD-10-CM | POA: Insufficient documentation

## 2024-01-31 DIAGNOSIS — Z79810 Long term (current) use of selective estrogen receptor modulators (SERMs): Secondary | ICD-10-CM | POA: Insufficient documentation

## 2024-01-31 LAB — CBC WITH DIFFERENTIAL (CANCER CENTER ONLY)
Abs Immature Granulocytes: 0.01 10*3/uL (ref 0.00–0.07)
Basophils Absolute: 0.1 10*3/uL (ref 0.0–0.1)
Basophils Relative: 1 %
Eosinophils Absolute: 0.1 10*3/uL (ref 0.0–0.5)
Eosinophils Relative: 3 %
HCT: 39.5 % (ref 36.0–46.0)
Hemoglobin: 13.2 g/dL (ref 12.0–15.0)
Immature Granulocytes: 0 %
Lymphocytes Relative: 49 %
Lymphs Abs: 2.8 10*3/uL (ref 0.7–4.0)
MCH: 30.3 pg (ref 26.0–34.0)
MCHC: 33.4 g/dL (ref 30.0–36.0)
MCV: 90.6 fL (ref 80.0–100.0)
Monocytes Absolute: 0.4 10*3/uL (ref 0.1–1.0)
Monocytes Relative: 7 %
Neutro Abs: 2.3 10*3/uL (ref 1.7–7.7)
Neutrophils Relative %: 40 %
Platelet Count: 219 10*3/uL (ref 150–400)
RBC: 4.36 MIL/uL (ref 3.87–5.11)
RDW: 13.2 % (ref 11.5–15.5)
WBC Count: 5.7 10*3/uL (ref 4.0–10.5)
nRBC: 0 % (ref 0.0–0.2)

## 2024-01-31 LAB — CMP (CANCER CENTER ONLY)
ALT: 26 U/L (ref 0–44)
AST: 27 U/L (ref 15–41)
Albumin: 4.3 g/dL (ref 3.5–5.0)
Alkaline Phosphatase: 44 U/L (ref 38–126)
Anion gap: 6 (ref 5–15)
BUN: 13 mg/dL (ref 8–23)
CO2: 29 mmol/L (ref 22–32)
Calcium: 9.4 mg/dL (ref 8.9–10.3)
Chloride: 106 mmol/L (ref 98–111)
Creatinine: 0.56 mg/dL (ref 0.44–1.00)
GFR, Estimated: >60 mL/min (ref >60)
Glucose, Bld: 113 mg/dL — ABNORMAL HIGH (ref 70–99)
Potassium: 4.4 mmol/L (ref 3.5–5.1)
Sodium: 141 mmol/L (ref 135–145)
Total Bilirubin: 0.5 mg/dL (ref 0.0–1.2)
Total Protein: 7.1 g/dL (ref 6.5–8.1)

## 2024-01-31 MED ORDER — DENOSUMAB 60 MG/ML ~~LOC~~ SOSY
60.0000 mg | PREFILLED_SYRINGE | Freq: Once | SUBCUTANEOUS | Status: AC
  Administered 2024-01-31: 60 mg via SUBCUTANEOUS
  Filled 2024-01-31: qty 1

## 2024-02-09 DIAGNOSIS — G8929 Other chronic pain: Secondary | ICD-10-CM | POA: Diagnosis not present

## 2024-02-09 DIAGNOSIS — M7542 Impingement syndrome of left shoulder: Secondary | ICD-10-CM | POA: Diagnosis not present

## 2024-02-29 ENCOUNTER — Other Ambulatory Visit: Payer: Self-pay | Admitting: Family Medicine

## 2024-03-15 DIAGNOSIS — M79622 Pain in left upper arm: Secondary | ICD-10-CM | POA: Diagnosis not present

## 2024-03-15 DIAGNOSIS — M79629 Pain in unspecified upper arm: Secondary | ICD-10-CM | POA: Diagnosis not present

## 2024-04-28 DIAGNOSIS — L820 Inflamed seborrheic keratosis: Secondary | ICD-10-CM | POA: Diagnosis not present

## 2024-04-28 DIAGNOSIS — D2271 Melanocytic nevi of right lower limb, including hip: Secondary | ICD-10-CM | POA: Diagnosis not present

## 2024-04-28 DIAGNOSIS — D1801 Hemangioma of skin and subcutaneous tissue: Secondary | ICD-10-CM | POA: Diagnosis not present

## 2024-04-28 DIAGNOSIS — L819 Disorder of pigmentation, unspecified: Secondary | ICD-10-CM | POA: Diagnosis not present

## 2024-04-28 DIAGNOSIS — L738 Other specified follicular disorders: Secondary | ICD-10-CM | POA: Diagnosis not present

## 2024-04-28 DIAGNOSIS — L821 Other seborrheic keratosis: Secondary | ICD-10-CM | POA: Diagnosis not present

## 2024-04-28 DIAGNOSIS — Z85828 Personal history of other malignant neoplasm of skin: Secondary | ICD-10-CM | POA: Diagnosis not present

## 2024-04-28 DIAGNOSIS — L814 Other melanin hyperpigmentation: Secondary | ICD-10-CM | POA: Diagnosis not present

## 2024-05-06 ENCOUNTER — Other Ambulatory Visit: Payer: Self-pay

## 2024-05-06 ENCOUNTER — Ambulatory Visit: Admission: EM | Admit: 2024-05-06 | Discharge: 2024-05-06 | Disposition: A | Discharge status: Home / Self Care

## 2024-05-06 ENCOUNTER — Encounter: Payer: Self-pay | Admitting: Emergency Medicine

## 2024-05-06 DIAGNOSIS — J4520 Mild intermittent asthma, uncomplicated: Secondary | ICD-10-CM | POA: Diagnosis not present

## 2024-05-06 DIAGNOSIS — U071 COVID-19: Secondary | ICD-10-CM | POA: Diagnosis not present

## 2024-05-06 DIAGNOSIS — R030 Elevated blood-pressure reading, without diagnosis of hypertension: Secondary | ICD-10-CM

## 2024-05-06 DIAGNOSIS — J069 Acute upper respiratory infection, unspecified: Secondary | ICD-10-CM

## 2024-05-06 LAB — POC SOFIA SARS ANTIGEN FIA: SARS Coronavirus 2 Ag: POSITIVE — AB

## 2024-05-06 MED ORDER — PROMETHAZINE-DM 6.25-15 MG/5ML PO SYRP: 5.0000 mL | ORAL_SOLUTION | Freq: Four times a day (QID) | ORAL | 0 refills | Status: DC | PRN

## 2024-05-06 MED ORDER — AZELASTINE HCL 0.1 % NA SOLN: 1.0000 | Freq: Two times a day (BID) | NASAL | 0 refills | Status: AC

## 2024-05-06 NOTE — ED Triage Notes (Addendum)
 Pt reports scratchy throat, nasal congestion, facial pressure, bilateral ear pressure since yesterday. Has tried salt water  rinses in throat,nasal spray, and ibuprofen. Denies fever.

## 2024-05-06 NOTE — ED Provider Notes (Signed)
 [facility]    CSN: [id number] Arrival date & time: 05/06/24  1059      History   Chief Complaint Chief Complaint  Patient presents with   Nasal Congestion    HPI Laura [name] is a 68 y.o. female.   Patient presenting today with 1 day history of scratchy throat, nasal congestion, facial pressure, bilateral ear pressure, cough.  Denies fever, chills, body aches, chest pain, shortness of breath, abdominal pain, vomiting, diarrhea.  So far trying nasal saline and ibuprofen with minimal relief.  History of asthma on albuterol  as needed, has not needed it since onset of symptoms.    Past Medical History:  Diagnosis Date   ABNORMAL ELECTROCARDIOGRAM 08/05/2010   02-19-15 EKG shows NSR. Pt voices no issues with heart related problems.   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   GERD 01/25/2008   uses Prevacid  as needed   History of radiation therapy    Left Breast- 12/16/21-01/13/22- Dr. 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   Tendonitis    right wrist-has soft splint in place.   Tibialis tendinitis 05/13/2010   Unspecified disorder of liver 01/19/2008   evaluated and thought to be related to  pain med use at current time- no further issues noted.   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/03/2019   OA (osteoarthritis) of knee 06/24/2015   Preop cardiovascular exam 02/19/2015   UTI (urinary tract infection) 12/16/2013   Osteoporosis 10/30/2013   Menopausal state 05/18/2013   Routine general medical examination at a health care facility 12/02/2011   Impaired glucose tolerance 01/11/2011   Sternal fracture 12/29/2010   Preventative health care 12/26/2010   VENTRICULAR HYPERTROPHY, LEFT 09/23/2010   Essential hypertension 08/05/2010   ABNORMAL ELECTROCARDIOGRAM 08/05/2010   Tibialis tendinitis 05/13/2010   CONSTIPATION 02/21/2008   ESOPHAGEAL STRICTURE 02/17/2008   ACNE, MILD 02/17/2008   GERD 01/25/2008   HYPERCHOLESTEROLEMIA 01/19/2008   Unspecified disorder of liver 01/19/2008   METATARSALGIA 11/08/2007   Cavus deformity of foot, 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    Past Surgical History:  Procedure Laterality Date   ABDOMINAL HYSTERECTOMY  2002   TAH-LTSO-LOA  ARTHROSCOPIC REPAIR ACL Right 10/1998   right knee    BACK SURGERY  2019   BREAST EXCISIONAL BIOPSY Left 2015   benign   BREAST LUMPECTOMY     BREAST LUMPECTOMY Left 10/2021   BREAST SURGERY Left    lumpectomy-benign 2015   CARDIOVASCULAR STRESS TEST  2011   CARPAL TUNNEL RELEASE  2014   left   CHOLECYSTECTOMY  1985   open   COLONOSCOPY     COLONOSCOPY W/ POLYPECTOMY      DILATION AND CURETTAGE OF UTERUS     ELBOW ARTHROPLASTY  2014   left   ESOPHAGOGASTRODUODENOSCOPY  04/01/1998   KNEE ARTHROSCOPY Bilateral    meniscus tear-left.   NASAL SINUS SURGERY     s/p   ORIF ELBOW FRACTURE  12/23/2011   Procedure: OPEN REDUCTION INTERNAL FIXATION (ORIF) ELBOW/OLECRANON FRACTURE;  Surgeon: Prentice LELON Pagan, MD;  Location: [facility] OR;  Service: Orthopedics;  Laterality: Left;  LEFT ELBOW ORIF OF PROXIMAL OLECRANON AND RADIAL HEAD, POSSIBLE ARTHROPLASTY   OVARIAN CYST REMOVAL     RADIOACTIVE SEED GUIDED EXCISIONAL BREAST BIOPSY Left 11/04/2021   Procedure: RADIOACTIVE SEED GUIDED EXCISIONAL LEFT BREAST BIOPSY;  Surgeon: Ebbie Cough, MD;  Location: [facility];  Service: General;  Laterality: Left;   TOTAL KNEE ARTHROPLASTY Right 06/24/2015   Procedure: RIGHT TOTAL KNEE ARTHROPLASTY;  Surgeon: Dempsey Moan, MD;  Location: [facility];  Service: Orthopedics;  Laterality: Right;   TRANSFORAMINAL LUMBAR INTERBODY FUSION (TLIF) WITH PEDICLE SCREW FIXATION 1 LEVEL Left 03/03/2019   Procedure: Left Lumbar four-five Redo Microdiscectomy with transforaminal lumbar interbody fusion;  Surgeon: Unice Pac, MD;  Location: [facility] OR;  Service: Neurosurgery;  Laterality: Left;   UPPER GASTROINTESTINAL ENDOSCOPY     US  ECHOCARDIOGRAPHY  2011    OB History   No obstetric history on file.      Home Medications    Prior to Admission medications   Medication Sig Start Date End Date Taking? Authorizing Provider  azelastine  (ASTELIN ) 0.1 % nasal spray Place 1 spray into both nostrils 2 (two) times daily. Use in each nostril as directed 05/06/24  Yes Stuart Vernell Norris, PA-C  promethazine -dextromethorphan (PROMETHAZINE -DM) 6.25-15 MG/5ML syrup Take 5 mLs by mouth 4 (four) times daily as needed. 05/06/24  Yes Stuart Vernell Norris, PA-C  albuterol  (VENTOLIN  HFA) 108 (90 Base) MCG/ACT inhaler 2 PUFFS INHALATION EVERY 4-6 HOURS AS NEEDED COUGH/WHEEZE 90 DAYS    [provider]  albuterol  (VENTOLIN  HFA) 108 (90 Base) MCG/ACT inhaler 2 PUFFS INHALATION EVERY 4-6 HOURS AS NEEDED COUGH/WHEEZE 90 DAYS; Duration: 90    [provider]  amLODipine  (NORVASC ) 5 MG tablet Take 1 tablet (5 mg total) by mouth daily. 09/13/23   Burchette, Wolm LELON, MD  Aspirin  (VAZALORE ) 81 MG CAPS Take 81 mg by mouth daily.    [provider]  calcium  carbonate (OS-CAL - DOSED IN MG OF ELEMENTAL CALCIUM ) 1250 (500 Ca) MG tablet Take 1 tablet by mouth.    [provider]  Cholecalciferol (D3 ADULT PO)     [provider]  cyclobenzaprine  (FLEXERIL ) 10 MG tablet Take 1 tablet (10 mg total) by mouth 3 (three) times daily as needed for muscle spasms. 10/19/23   Burchette, Wolm LELON, MD  FIBER ADULT GUMMIES PO     [provider]  fluticasone  (FLONASE ) 50 MCG/ACT nasal spray PLACE 1 SPRAY INTO BOTH NOSTRILS 2 (TWO) TIMES DAILY    [provider]  losartan  (COZAAR ) 100 MG tablet Take  1 tablet (100 mg total) by mouth daily. 09/13/23   Burchette, Wolm ORN, MD  Multiple Vitamin (MULTIVITAMIN) tablet Take 1 tablet by mouth daily.    [provider]  Omega-3 Fatty Acids (FISH OIL) 300 MG CAPS     [provider]  tamoxifen  (NOLVADEX ) 20 MG tablet TAKE 1 TABLET BY MOUTH EVERY DAY 04/14/23   Odean Potts, MD  traZODone  (DESYREL ) 50 MG tablet TAKE HALF TO ONE TABLET BY MOUTH AT BEDTIME AS NEEDED FOR SLEEP 02/29/24   Burchette, Wolm ORN, MD  vitamin E  400 UNIT capsule Take 400 Units by mouth daily.    [provider]  olmesartan (BENICAR) 20 MG tablet Take 20 mg by mouth daily.    11/30/11  [provider]    Family History Family History  Problem Relation Age of Onset   Diabetes Mother    Thyroid disease Mother        hypothyroidism   Lung cancer Mother        no smoking hx   Heart disease Father 67       cad   Other Brother        benign brain mass in late 42s   Pancreatic cancer Maternal Aunt        dx  and d. late 59s   Cancer Maternal Aunt        unknown GYN; mets; dx and d. 44s   Thyroid cancer Maternal Aunt        mets; dx and d. 76s   Colon cancer Neg Hx    Colon polyps Neg Hx    Esophageal cancer Neg Hx    Stomach cancer Neg Hx    Rectal cancer Neg Hx    Breast cancer Neg Hx     Social History Social History   Tobacco Use   Smoking status: Never   Smokeless tobacco: Never  Vaping Use   Vaping status: Never Used  Substance Use Topics   Alcohol use: No   Drug use: No     Allergies   Elemental sulfur, Zometa  [zoledronic  acid], Nitrofurantoin  monohyd macro, Bactrim  [sulfamethoxazole -trimethoprim ], and Other   Review of Systems Review of Systems Per HPI  Physical Exam Triage Vital Signs ED Triage Vitals  Encounter Vitals Group     BP 05/06/24 1150 (!) 157/88     Girls Systolic BP Percentile --      Girls Diastolic BP Percentile --      Boys Systolic BP Percentile --      Boys Diastolic BP Percentile --      Pulse Rate 05/06/24 1150 74     Resp 05/06/24 1150 20     Temp 05/06/24 1150 98.5 F (36.9 C)     Temp Source 05/06/24 1150 Oral     SpO2 05/06/24 1150 97 %     Weight --      Height --      Head Circumference --      Peak Flow --      Pain Score 05/06/24 1148 2     Pain Loc --      Pain Education --      Exclude from Growth Chart --    No data found.  Updated Vital Signs BP (!) 157/88 (BP Location: Right Arm)   Pulse 74   Temp 98.5 F (36.9 C) (Oral)   Resp 20   SpO2 97%   Visual Acuity Right Eye Distance:   Left Eye Distance:  Bilateral Distance:    Right Eye Near:   Left Eye Near:    Bilateral Near:     Physical Exam Vitals and nursing note reviewed.  Constitutional:      Appearance: Normal appearance. She is not ill-appearing.  HENT:     Head: Atraumatic.     Right Ear: Tympanic membrane and external ear normal.     Left Ear: Tympanic membrane and external ear normal.     Nose: Rhinorrhea present.     Mouth/Throat:      Mouth: Mucous membranes are moist.     Pharynx: Posterior oropharyngeal erythema present.  Eyes:     Extraocular Movements: Extraocular movements intact.     Conjunctiva/sclera: Conjunctivae normal.  Cardiovascular:     Rate and Rhythm: Normal rate and regular rhythm.     Heart sounds: Normal heart sounds.  Pulmonary:     Effort: Pulmonary effort is normal.     Breath sounds: Normal breath sounds. No wheezing or rales.  Musculoskeletal:        General: Normal range of motion.     Cervical back: Normal range of motion and neck supple.  Skin:    General: Skin is warm and dry.  Neurological:     Mental Status: She is alert and oriented to person, place, and time.  Psychiatric:        Mood and Affect: Mood normal.        Thought Content: Thought content normal.        Judgment: Judgment normal.      UC Treatments / Results  Labs (all labs ordered are listed, but only abnormal results are displayed) Labs Reviewed  POC SOFIA SARS ANTIGEN FIA - Abnormal; Notable for the following components:      Result Value   SARS Coronavirus 2 Ag Positive (*)    All other components within normal limits    EKG   Radiology No results found.  Procedures Procedures (including critical care time)  Medications Ordered in UC Medications - No data to display  Initial Impression / Assessment and Plan / UC Course  I have reviewed the triage vital signs and the nursing notes.  Pertinent labs & imaging results that were available during my care of the patient were reviewed by me and considered in my medical decision making (see chart for details).     Rapid COVID-positive, will treat with Phenergan  DM, Astelin , supportive over-the-counter medications and home care.  List provided of safe over-the-counter remedies that will not further elevate blood pressure as blood pressure already elevated.  She states she has albuterol  at home that she may use as needed if her asthma begins to be  exacerbated.  Return for worsening symptoms.  Final Clinical Impressions(s) / UC Diagnoses   Final diagnoses:  Viral URI with cough  COVID-19  Elevated blood pressure reading  Mild intermittent asthma without complication     Discharge Instructions      I have prescribed a good nasal spray and cough syrup to help with your symptoms and you may also take over-the-counter medications such as Coricidin HBP and plain Mucinex that will not elevate your blood pressure.  You may use nasal saline, humidifiers, drink lots of fluids, get lots of rest.  Use your albuterol  as needed for wheezing, chest tightness, cough.  Follow-up for significantly worsening symptoms.    ED Prescriptions     Medication Sig Dispense Auth. Provider   azelastine  (ASTELIN ) 0.1 % nasal spray Place 1  spray into both nostrils 2 (two) times daily. Use in each nostril as directed 30 mL Stuart Vernell Norris, PA-C   promethazine -dextromethorphan (PROMETHAZINE -DM) 6.25-15 MG/5ML syrup Take 5 mLs by mouth 4 (four) times daily as needed. 100 mL Stuart Vernell Norris, [place]      PDMP not reviewed this encounter.   Stuart Vernell Norris, [place] 05/06/24 1314

## 2024-05-06 NOTE — Discharge Instructions (Signed)
 I have prescribed a good nasal spray and cough syrup to help with your symptoms and you may also take over-the-counter medications such as Coricidin HBP and plain Mucinex that will not elevate your blood pressure.  You may use nasal saline, humidifiers, drink lots of fluids, get lots of rest.  Use your albuterol  as needed for wheezing, chest tightness, cough.  Follow-up for significantly worsening symptoms.

## 2024-05-11 ENCOUNTER — Other Ambulatory Visit: Payer: Self-pay | Admitting: Hematology and Oncology

## 2024-05-22 DIAGNOSIS — L308 Other specified dermatitis: Secondary | ICD-10-CM | POA: Diagnosis not present

## 2024-05-28 ENCOUNTER — Other Ambulatory Visit: Payer: Self-pay | Admitting: Family Medicine

## 2024-06-13 ENCOUNTER — Other Ambulatory Visit: Payer: Self-pay | Admitting: Hematology and Oncology

## 2024-06-13 DIAGNOSIS — Z9889 Other specified postprocedural states: Secondary | ICD-10-CM

## 2024-06-27 ENCOUNTER — Ambulatory Visit (INDEPENDENT_AMBULATORY_CARE_PROVIDER_SITE_OTHER)

## 2024-06-27 DIAGNOSIS — Z23 Encounter for immunization: Secondary | ICD-10-CM

## 2024-07-25 ENCOUNTER — Ambulatory Visit
Admission: RE | Admit: 2024-07-25 | Discharge: 2024-07-25 | Disposition: A | Discharge status: Home / Self Care | Source: Ambulatory Visit | Attending: Hematology and Oncology | Admitting: Hematology and Oncology

## 2024-07-25 DIAGNOSIS — R928 Other abnormal and inconclusive findings on diagnostic imaging of breast: Secondary | ICD-10-CM | POA: Diagnosis not present

## 2024-07-25 DIAGNOSIS — Z9889 Other specified postprocedural states: Secondary | ICD-10-CM

## 2024-08-01 ENCOUNTER — Other Ambulatory Visit: Payer: Self-pay | Admitting: *Deleted

## 2024-08-01 DIAGNOSIS — C50412 Malignant neoplasm of upper-outer quadrant of left female breast: Secondary | ICD-10-CM

## 2024-08-02 ENCOUNTER — Encounter: Payer: Self-pay | Admitting: Hematology and Oncology

## 2024-08-02 ENCOUNTER — Inpatient Hospital Stay: Payer: Medicare HMO | Attending: Hematology and Oncology | Admitting: Hematology and Oncology

## 2024-08-02 ENCOUNTER — Inpatient Hospital Stay: Payer: Medicare HMO

## 2024-08-02 VITALS — BP 137/78 | HR 72 | Temp 97.9°F | Resp 18 | Ht 67.0 in | Wt 174.1 lb

## 2024-08-02 DIAGNOSIS — Z79899 Other long term (current) drug therapy: Secondary | ICD-10-CM | POA: Insufficient documentation

## 2024-08-02 DIAGNOSIS — Z7982 Long term (current) use of aspirin: Secondary | ICD-10-CM | POA: Insufficient documentation

## 2024-08-02 DIAGNOSIS — M810 Age-related osteoporosis without current pathological fracture: Secondary | ICD-10-CM | POA: Insufficient documentation

## 2024-08-02 DIAGNOSIS — Z170 Estrogen receptor positive status [ER+]: Secondary | ICD-10-CM | POA: Insufficient documentation

## 2024-08-02 DIAGNOSIS — Z780 Asymptomatic menopausal state: Secondary | ICD-10-CM

## 2024-08-02 DIAGNOSIS — C50412 Malignant neoplasm of upper-outer quadrant of left female breast: Secondary | ICD-10-CM | POA: Diagnosis not present

## 2024-08-02 DIAGNOSIS — Z79810 Long term (current) use of selective estrogen receptor modulators (SERMs): Secondary | ICD-10-CM | POA: Insufficient documentation

## 2024-08-02 LAB — CBC WITH DIFFERENTIAL (CANCER CENTER ONLY)
Abs Immature Granulocytes: 0.02 K/uL (ref 0.00–0.07)
Basophils Absolute: 0 K/uL (ref 0.0–0.1)
Basophils Relative: 1 %
Eosinophils Absolute: 0.2 K/uL (ref 0.0–0.5)
Eosinophils Relative: 2 %
HCT: 38.6 % (ref 36.0–46.0)
Hemoglobin: 13 g/dL (ref 12.0–15.0)
Immature Granulocytes: 0 %
Lymphocytes Relative: 34 %
Lymphs Abs: 2.8 K/uL (ref 0.7–4.0)
MCH: 30.3 pg (ref 26.0–34.0)
MCHC: 33.7 g/dL (ref 30.0–36.0)
MCV: 90 fL (ref 80.0–100.0)
Monocytes Absolute: 0.4 K/uL (ref 0.1–1.0)
Monocytes Relative: 5 %
Neutro Abs: 4.8 K/uL (ref 1.7–7.7)
Neutrophils Relative %: 58 %
Platelet Count: 224 K/uL (ref 150–400)
RBC: 4.29 MIL/uL (ref 3.87–5.11)
RDW: 13.1 % (ref 11.5–15.5)
WBC Count: 8.3 K/uL (ref 4.0–10.5)
nRBC: 0 % (ref 0.0–0.2)

## 2024-08-02 LAB — CMP (CANCER CENTER ONLY)
ALT: 32 U/L (ref 0–44)
AST: 37 U/L (ref 15–41)
Albumin: 4.4 g/dL (ref 3.5–5.0)
Alkaline Phosphatase: 59 U/L (ref 38–126)
Anion gap: 10 (ref 5–15)
BUN: 12 mg/dL (ref 8–23)
CO2: 27 mmol/L (ref 22–32)
Calcium: 9.4 mg/dL (ref 8.9–10.3)
Chloride: 104 mmol/L (ref 98–111)
Creatinine: 0.65 mg/dL (ref 0.44–1.00)
GFR, Estimated: >60 mL/min (ref >60)
Glucose, Bld: 113 mg/dL — ABNORMAL HIGH (ref 70–99)
Potassium: 4.3 mmol/L (ref 3.5–5.1)
Sodium: 141 mmol/L (ref 135–145)
Total Bilirubin: 0.5 mg/dL (ref 0.0–1.2)
Total Protein: 7 g/dL (ref 6.5–8.1)

## 2024-08-02 MED ORDER — DENOSUMAB 60 MG/ML ~~LOC~~ SOSY
60.0000 mg | PREFILLED_SYRINGE | Freq: Once | SUBCUTANEOUS | Status: AC
  Administered 2024-08-02: 60 mg via SUBCUTANEOUS
  Filled 2024-08-02: qty 1

## 2024-08-02 NOTE — Progress Notes (Signed)
 Patient Care Team: Micheal Wolm ORN, MD as PCP - General (Family Medicine) Santo Stanly LABOR, MD as PCP - Cardiology (Cardiology) Lonni ORN Hamilton, MD (Obstetrics and Gynecology) Shannon Agent, MD as Consulting Physician (Radiation Oncology) Ebbie Cough, MD as Consulting Physician (General Surgery) Odean Potts, MD as Consulting Physician (Hematology and Oncology)  DIAGNOSIS:  Encounter Diagnosis  Name Primary?   Malignant neoplasm of upper-outer quadrant of left breast in female, estrogen receptor positive (HCC) Yes    SUMMARY OF ONCOLOGIC HISTORY: Oncology History  Malignant neoplasm of upper-outer quadrant of left breast in female, estrogen receptor positive (HCC)  08/20/2021 Initial Diagnosis   Screening mammogram detected possible distortion in the left breast: Biopsy: Atypical lobular hyperplasia   11/04/2021 Surgery   Left lumpectomy: Grade 2 invasive lobular cancer pleomorphic features, 5 mm, margins negative, ER 100%, PR 95%, Ki-67 2%, HER2 2+ by IHC, FISH negative, ratio 1.31   11/04/2021 Cancer Staging   Staging form: Breast, AJCC 8th Edition - Pathologic stage from 11/04/2021: Stage IA (pT1a, pN0, cM0, G2, ER+, PR+, HER2-) - Signed by Crawford Morna Pickle, NP on 04/15/2022 Stage prefix: Initial diagnosis Histologic grading system: 3 grade system   12/16/2021 - 01/13/2022 Radiation Therapy   Site Technique Total Dose (Gy) Dose per Fx (Gy) Completed Fx Beam Energies  Breast, Left: Breast_L 3D 40.05/40.05 2.67 15/15 10X  Breast, Left: Breast_L_Bst 3D 12/12 2 6/6 6X, 10X     01/2022 -  Anti-estrogen oral therapy   Tamoxifen    06/23/2022 Genetic Testing   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  Report date is 06/23/2022.   The CancerNext-Expanded gene panel offered by [facility] and includes sequencing, rearrangement, and RNA analysis for the following 77 genes: AIP, ALK, APC, ATM, AXIN2, BAP1, BARD1, BLM, BMPR1A, BRCA1, BRCA2,  BRIP1, CDC73, CDH1, CDK4, CDKN1B, CDKN2A, CHEK2, CTNNA1, DICER1, FANCC, FH, FLCN, GALNT12, KIF1B, LZTR1, MAX, MEN1, MET, MLH1, MSH2, MSH3, MSH6, MUTYH, NBN, NF1, NF2, NTHL1, PALB2, PHOX2B, PMS2, POT1, PRKAR1A, PTCH1, PTEN, RAD51C, RAD51D, RB1, RECQL, RET, SDHA, SDHAF2, SDHB, SDHC, SDHD, SMAD4, SMARCA4, SMARCB1, SMARCE1, STK11, SUFU, TMEM127, TP53, TSC1, TSC2, VHL and XRCC2 (sequencing and deletion/duplication); EGFR, EGLN1, HOXB13, KIT, MITF, PDGFRA, POLD1, and POLE (sequencing only); EPCAM and GREM1 (deletion/duplication only).      CHIEF COMPLIANT: Follow-up on tamoxifen   HISTORY OF PRESENT ILLNESS:  History of Present Illness Laura [name] is a 68 year old female with breast cancer on tamoxifen  who presents for routine follow-up.  She has been on tamoxifen  for two and a half years, experiencing occasional hot flashes and insomnia without significant side effects. A recent mammogram showed non-dense breast tissue, categorized as A or B. She is due for a bone density scan next year. She denies any pain or discomfort and has no other complaints at this time.     ALLERGIES:  is allergic to elemental sulfur, zometa  [zoledronic  acid], nitrofurantoin  monohyd macro, bactrim  [sulfamethoxazole -trimethoprim ], and other.  MEDICATIONS:  Current Outpatient Medications  Medication Sig Dispense Refill   albuterol  (VENTOLIN  HFA) 108 (90 Base) MCG/ACT inhaler 2 PUFFS INHALATION EVERY 4-6 HOURS AS NEEDED COUGH/WHEEZE 90 DAYS     albuterol  (VENTOLIN  HFA) 108 (90 Base) MCG/ACT inhaler 2 PUFFS INHALATION EVERY 4-6 HOURS AS NEEDED COUGH/WHEEZE 90 DAYS; Duration: 90     amLODipine  (NORVASC ) 5 MG tablet Take 1 tablet (5 mg total) by mouth daily. 90 tablet 3   Aspirin  (VAZALORE ) 81 MG CAPS Take 81 mg by mouth daily.     azelastine  (ASTELIN )  0.1 % nasal spray Place 1 spray into both nostrils 2 (two) times daily. Use in each nostril as directed 30 mL 0   calcium  carbonate (OS-CAL - DOSED IN MG OF ELEMENTAL  CALCIUM ) 1250 (500 Ca) MG tablet Take 1 tablet by mouth.     Cholecalciferol (D3 ADULT PO)      cyclobenzaprine  (FLEXERIL ) 10 MG tablet Take 1 tablet (10 mg total) by mouth 3 (three) times daily as needed for muscle spasms. 20 tablet 0   FIBER ADULT GUMMIES PO      fluticasone  (FLONASE ) 50 MCG/ACT nasal spray PLACE 1 SPRAY INTO BOTH NOSTRILS 2 (TWO) TIMES DAILY     losartan  (COZAAR ) 100 MG tablet Take 1 tablet (100 mg total) by mouth daily. 90 tablet 3   Multiple Vitamin (MULTIVITAMIN) tablet Take 1 tablet by mouth daily.     Omega-3 Fatty Acids (FISH OIL) 300 MG CAPS      promethazine -dextromethorphan (PROMETHAZINE -DM) 6.25-15 MG/5ML syrup Take 5 mLs by mouth 4 (four) times daily as needed. 100 mL 0   tamoxifen  (NOLVADEX ) 20 MG tablet TAKE 1 TABLET BY MOUTH EVERY DAY 90 tablet 3   traZODone  (DESYREL ) 50 MG tablet TAKE HALF TO ONE TABLET BY MOUTH AT BEDTIME AS NEEDED FOR SLEEP 90 tablet 1   vitamin E  400 UNIT capsule Take 400 Units by mouth daily.     No current facility-administered medications for this visit.    PHYSICAL EXAMINATION: ECOG PERFORMANCE STATUS: 1 - Symptomatic but completely ambulatory  Vitals:   08/02/24 0915 08/02/24 0918  BP: (!) 140/88 137/78  Pulse: 72   Resp: 18   Temp: 97.9 F (36.6 C)   SpO2: 99%    Filed Weights   08/02/24 0915  Weight: 174 lb 1.6 oz (79 kg)      LABORATORY DATA:  I have reviewed the data as listed    Latest Ref Rng & Units 01/31/2024    9:02 AM 10/15/2023    4:46 PM 08/02/2023    8:36 AM  CMP  Glucose 70 - 99 mg/dL 886  791  889   BUN 8 - 23 mg/dL 13  12  14    Creatinine 0.44 - 1.00 mg/dL 9.43  9.36  9.38   Sodium 135 - 145 mmol/L 141  140  140   Potassium 3.5 - 5.1 mmol/L 4.4  3.7  4.2   Chloride 98 - 111 mmol/L 106  104  107   CO2 22 - 32 mmol/L 29  25  28    Calcium  8.9 - 10.3 mg/dL 9.4  9.1  9.3   Total Protein 6.5 - 8.1 g/dL 7.1   6.8   Total Bilirubin 0.0 - 1.2 mg/dL 0.5   0.5   Alkaline Phos 38 - 126 U/L 44   40   AST  15 - 41 U/L 27   25   ALT 0 - 44 U/L 26   24     Lab Results  Component Value Date   WBC 8.3 08/02/2024   HGB 13.0 08/02/2024   HCT 38.6 08/02/2024   MCV 90.0 08/02/2024   PLT 224 08/02/2024   NEUTROABS 4.8 08/02/2024    ASSESSMENT & PLAN:  Malignant neoplasm of upper-outer quadrant of left breast in female, estrogen receptor positive (HCC) 11/04/2021: Screening mammogram detected possible distortion in the left breast initial biopsy was [facility], lumpectomy revealed grade 2 invasive lobular cancer with pleomorphic features measuring 5 mm size, margins negative, ER 100%, PR 95%, Ki-67 2%,  HER2 negative (2+ by IHC but FISH negative) 12/17/2021-01/13/2022: Adjuvant radiation   Current treatment: Adjuvant antiestrogen therapy with tamoxifen  (because of osteoporosis) started 01/13/2022   Tamoxifen  toxicities: Moderate to severe hot flashes Muscle cramps   Osteoporosis: T score -2.8 in 2019.  She had a severe reaction to Zometa  requiring emergency room visit on 01/21/2022.  Today she will see Prolia  injection.   Breast cancer surveillance: Mammogram 07/25/2024: Benign breast density category B Bone density 08/02/2024: T-score -2.7: Osteoporosis: Currently on Prolia  along with calcium  and vitamin D  (bone density 2019: T-score -2.8) I ordered a bone density test to be done next year. Return to clinic every 6 months with labs and Prolia  injections and once a year follow-up with me    No orders of the defined types were placed in this encounter.  The patient has a good understanding of the overall plan. she agrees with it. she will call with any problems that may develop before the next visit here.  I personally spent a total of 30 minutes in the care of the patient today including preparing to see the patient, getting/reviewing separately obtained history, performing a medically appropriate exam/evaluation, counseling and educating, placing orders, referring and communicating with other health care  professionals, documenting clinical information in the EHR, independently interpreting results, communicating results, and coordinating care.   Viinay K Ranisha Allaire, MD 08/02/24

## 2024-08-02 NOTE — Assessment & Plan Note (Signed)
 11/04/2021: Screening mammogram detected possible distortion in the left breast initial biopsy was [facility], lumpectomy revealed grade 2 invasive lobular cancer with pleomorphic features measuring 5 mm size, margins negative, ER 100%, PR 95%, Ki-67 2%, HER2 negative (2+ by IHC but FISH negative) 12/17/2021-01/13/2022: Adjuvant radiation   Current treatment: Adjuvant antiestrogen therapy with tamoxifen  (because of osteoporosis) started 01/13/2022   Tamoxifen  toxicities: Moderate to severe hot flashes Muscle cramps   Osteoporosis: T score -2.8 in 2019.  She had a severe reaction to Zometa  requiring emergency room visit on 01/21/2022.  Today she will see Prolia  injection.   Breast cancer surveillance: Mammogram 07/25/2024: Benign breast density category B Bone density 08/02/2024: T-score -2.7: Osteoporosis: Currently on Prolia  along with calcium  and vitamin D  (bone density 2019: T-score -2.8)   Return to clinic every 6 months with labs and Prolia  injections and once a year follow-up with me

## 2024-08-10 ENCOUNTER — Other Ambulatory Visit: Payer: Self-pay | Admitting: Family Medicine

## 2024-08-16 ENCOUNTER — Other Ambulatory Visit: Payer: Self-pay | Admitting: Family Medicine

## 2024-08-29 ENCOUNTER — Ambulatory Visit: Admitting: Gastroenterology

## 2024-09-13 ENCOUNTER — Encounter: Payer: Medicare HMO | Admitting: Family Medicine

## 2024-09-19 ENCOUNTER — Ambulatory Visit: Payer: Self-pay | Admitting: Family Medicine

## 2024-09-19 ENCOUNTER — Ambulatory Visit: Payer: Medicare HMO | Admitting: Family Medicine

## 2024-09-19 ENCOUNTER — Inpatient Hospital Stay

## 2024-09-19 ENCOUNTER — Encounter: Payer: Self-pay | Admitting: Family Medicine

## 2024-09-19 VITALS — BP 130/70 | HR 81 | Temp 97.9°F | Ht 65.35 in | Wt 173.0 lb

## 2024-09-19 DIAGNOSIS — L659 Nonscarring hair loss, unspecified: Secondary | ICD-10-CM

## 2024-09-19 DIAGNOSIS — R7302 Impaired glucose tolerance (oral): Secondary | ICD-10-CM

## 2024-09-19 DIAGNOSIS — E785 Hyperlipidemia, unspecified: Secondary | ICD-10-CM | POA: Diagnosis not present

## 2024-09-19 DIAGNOSIS — Z0000 Encounter for general adult medical examination without abnormal findings: Secondary | ICD-10-CM | POA: Diagnosis not present

## 2024-09-19 LAB — HEMOGLOBIN A1C: Hgb A1c MFr Bld: 6 % (ref 4.6–6.5)

## 2024-09-19 LAB — VITAMIN D 25 HYDROXY (VIT D DEFICIENCY, FRACTURES): VITD: 32.03 ng/mL (ref 30.00–100.00)

## 2024-09-19 LAB — TSH: TSH: 3.25 u[IU]/mL (ref 0.35–5.50)

## 2024-09-19 LAB — LIPID PANEL
Cholesterol: 171 mg/dL (ref 28–200)
HDL: 41.9 mg/dL
NonHDL: 129.25
Total CHOL/HDL Ratio: 4
Triglycerides: 449 mg/dL — ABNORMAL HIGH (ref 10.0–149.0)
VLDL: 89.8 mg/dL — ABNORMAL HIGH (ref 0.0–40.0)

## 2024-09-19 LAB — LDL CHOLESTEROL, DIRECT: Direct LDL: 53 mg/dL

## 2024-09-19 NOTE — Progress Notes (Signed)
 "  Established Patient Office Visit  Subjective   Patient ID: Laura [name], female    DOB: 03/03/1956  Age: 69 y.o. MRN: [id number]  Chief Complaint  Patient presents with   Annual Exam    HPI   [name] seen today for annual exam-and to discuss alopecia issues as below..  She has history of hypertension, asthma, GERD, impaired glucose tolerance, breast cancer, hyperlipidemia.  She is followed by multiple specialist including cardiology, dermatology, oncology.  She is maintained on tamoxifen .  Getting DEXA scans through oncology.  She sees dermatologist yearly for skin cancer screening.  She had coronary calcium  score last January of 0.  Since last year, had Shingrix vaccine last February and March.  We do not have dates.  She does have some generalized alopecia which she knows may be age-related or possibly related to her tamoxifen  therapy.  Hair loss is not severe.  Also has family history of hypothyroidism in her mother and she would like to get thyroid assessed.  No recent vitamin D  level.  No history of iron deficiency anemia.  Health maintenance reviewed  Health Maintenance  Topic Date Due   Medicare Annual Wellness (AWV)  Never done   Zoster Vaccines- Shingrix (1 of 2) 11/09/1975   COVID-19 Vaccine (5 - 2025-26 season) 05/15/2024   DTaP/Tdap/Td (2 - Td or Tdap) 06/22/2024   Mammogram  07/25/2025   Colonoscopy  05/10/2033   Pneumococcal Vaccine: 50+ Years  Completed   Influenza Vaccine  Completed   Bone Density Scan  Completed   Hepatitis C Screening  Completed   Meningococcal B Vaccine  Aged Out   Family history-mother has lung cancer as well as type 2 diabetes and hypothyroidism.  Her father died age 67 of motor vehicle accident.  He had CAD in his 45s.  She has a brother who is 63 and a smoker with COPD.  Social history-she and her husband both retired from [email or web address] company about 10 years ago.  Never smoked.  No alcohol.  1 son.  No grandchildren.  Past Medical  History:  Diagnosis Date   ABNORMAL ELECTROCARDIOGRAM 08/05/2010   02-19-15 EKG shows NSR. Pt voices no issues with heart related problems.   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   GERD 01/25/2008   uses Prevacid  as needed   History of radiation therapy    Left Breast- 12/16/21-01/13/22- Dr. 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   Tendonitis    right wrist-has soft splint in place.   Tibialis tendinitis 05/13/2010   Unspecified disorder of liver 01/19/2008   evaluated and thought to be related to pain med use at current  time- no further issues noted.   VENTRICULAR HYPERTROPHY, LEFT 09/23/2010   Wears glasses    Past Surgical History:  Procedure Laterality Date   ABDOMINAL HYSTERECTOMY  2002   TAH-LTSO-LOA   ARTHROSCOPIC REPAIR ACL Right 10/1998   right knee    BACK SURGERY  2019   BREAST EXCISIONAL BIOPSY Left 2015   benign   BREAST LUMPECTOMY     BREAST LUMPECTOMY Left 10/2021   BREAST SURGERY Left    lumpectomy-benign 2015   CARDIOVASCULAR STRESS TEST  2011   CARPAL TUNNEL RELEASE  2014   left   CHOLECYSTECTOMY  1985   open   COLONOSCOPY     COLONOSCOPY W/ POLYPECTOMY     DILATION AND CURETTAGE OF  UTERUS     ELBOW ARTHROPLASTY  2014   left   ESOPHAGOGASTRODUODENOSCOPY  04/01/1998   KNEE ARTHROSCOPY Bilateral    meniscus tear-left.   NASAL SINUS SURGERY     s/p   ORIF ELBOW FRACTURE  12/23/2011   Procedure: OPEN REDUCTION INTERNAL FIXATION (ORIF) ELBOW/OLECRANON FRACTURE;  Surgeon: Prentice LELON Pagan, MD;  Location: [facility] OR;  Service: Orthopedics;  Laterality: Left;  LEFT ELBOW ORIF OF PROXIMAL OLECRANON AND RADIAL HEAD, POSSIBLE ARTHROPLASTY   OVARIAN CYST REMOVAL     RADIOACTIVE SEED GUIDED EXCISIONAL BREAST BIOPSY Left 11/04/2021   Procedure: RADIOACTIVE SEED GUIDED EXCISIONAL LEFT BREAST BIOPSY;  Surgeon: Ebbie Cough, MD;  Location: [facility];  Service: General;  Laterality: Left;   TOTAL KNEE ARTHROPLASTY Right 06/24/2015   Procedure: RIGHT TOTAL KNEE ARTHROPLASTY;  Surgeon: Dempsey Moan, MD;  Location: [facility];  Service: Orthopedics;  Laterality: Right;   TRANSFORAMINAL LUMBAR INTERBODY FUSION (TLIF) WITH PEDICLE SCREW FIXATION 1 LEVEL Left 03/03/2019   Procedure: Left Lumbar four-five Redo Microdiscectomy with transforaminal lumbar interbody fusion;  Surgeon: Unice Pac, MD;  Location: [facility];  Service: Neurosurgery;  Laterality: Left;   UPPER GASTROINTESTINAL ENDOSCOPY     US  ECHOCARDIOGRAPHY  2011    reports that she has never smoked. She has never used smokeless tobacco. She reports that she does not drink alcohol and does not use drugs. family history includes Cancer in her maternal aunt; Diabetes in her mother; Heart disease (age of onset: 35) in her father; Lung cancer in her mother; Other in her brother; Pancreatic cancer in her maternal aunt; Thyroid cancer in her maternal aunt; Thyroid disease in her mother. Allergies[1]  Review of Systems  Constitutional:  Negative for chills, fever, malaise/fatigue and weight loss.  HENT:  Negative for hearing loss.   Eyes:  Negative for blurred vision and double vision.  Respiratory:  Negative for cough and  shortness of breath.   Cardiovascular:  Negative for chest pain, palpitations and leg swelling.  Gastrointestinal:  Negative for abdominal pain, blood in stool, constipation and diarrhea.  Genitourinary:  Negative for dysuria.  Skin:  Negative for rash.  Neurological:  Negative for dizziness, speech change, seizures, loss of consciousness and headaches.  Psychiatric/Behavioral:  Negative for depression.       Objective:     BP 130/70   Pulse 81   Temp 97.9 F (36.6 C) (Oral)   Ht 5' 5.35 (1.66 m)   Wt 173 lb (78.5 kg)   SpO2 97%   BMI 28.48 kg/m  BP Readings from Last 3 Encounters:  09/19/24 130/70  08/02/24 137/78  05/06/24 (!) 157/88   Wt Readings from Last 3 Encounters:  09/19/24 173 lb (78.5 kg)  08/02/24 174 lb 1.6  oz (79 kg)  10/19/23 171 lb 12.8 oz (77.9 kg)      Physical Exam Vitals reviewed.  Constitutional:      General: She is not in acute distress.    Appearance: She is well-developed. She is not ill-appearing.  HENT:     Head: Normocephalic and atraumatic.     Mouth/Throat:     Mouth: Mucous membranes are moist.     Pharynx: Oropharynx is clear. No oropharyngeal exudate or posterior oropharyngeal erythema.  Eyes:     Pupils: Pupils are equal, round, and reactive to light.  Neck:     Thyroid: No thyromegaly.  Cardiovascular:     Rate and Rhythm: Normal rate and regular rhythm.     Heart sounds: Normal heart sounds. No murmur heard. Pulmonary:     Effort: No respiratory distress.     Breath sounds: Normal breath sounds. No wheezing or rales.  Abdominal:     General: Bowel sounds are normal. There is no distension.     Palpations: Abdomen is soft. There is no mass.     Tenderness: There is no abdominal tenderness. There is no guarding or rebound.  Musculoskeletal:        General: Normal range of motion.     Cervical back: Normal range of motion and neck supple.  Lymphadenopathy:     Cervical: No cervical adenopathy.  Skin:    Findings: No  rash.  Neurological:     Mental Status: She is alert and oriented to person, place, and time.     Cranial Nerves: No cranial nerve deficit.  Psychiatric:        Behavior: Behavior normal.        Thought Content: Thought content normal.        Judgment: Judgment normal.      No results found for any visits on 09/19/24.    The 10-year ASCVD risk score (Arnett DK, et al., 2019) is: 9.4%    Assessment & Plan:   Problem List Items Addressed This Visit       Unprioritized   Hyperlipidemia   Relevant Orders   Lipid panel   Impaired glucose tolerance   Relevant Orders   Hemoglobin A1c   Other Visit Diagnoses       Physical exam    -  Primary     Alopecia       Relevant Orders   TSH   VITAMIN D  25 Hydroxy (Vit-D Deficiency, Fractures)     69 year old female here for physical exam.  Also has history of some generalized alopecia and we recommend screening TSH and vitamin D  level.  Recent CBC normal.  Did discuss possible use of topical minoxidil.  -Flu vaccine already given - Colonoscopy up-to-date - Getting annual mammograms - Shingrix up-to-date but we do not have dates - Pneumonia vaccine complete - We did recommend she try to up her exercise to goal of 150 minutes/week at least of moderate intensity exercise  No follow-ups on file.    Wolm Scarlet, MD     [1]  Allergies Allergen Reactions   Elemental Sulfur Hives   Zometa  [Zoledronic  Acid] Other (See Comments)    Fever, chills, required ED visit   Nitrofurantoin  Monohyd Macro Other (See Comments)    Chills and eye pain.  Overall flu type symptoms   Bactrim  [Sulfamethoxazole -Trimethoprim ] Rash   Other Rash    dermabond   "

## 2025-01-17 ENCOUNTER — Inpatient Hospital Stay

## 2025-08-02 ENCOUNTER — Inpatient Hospital Stay

## 2025-08-02 ENCOUNTER — Inpatient Hospital Stay: Admitting: Hematology and Oncology

## 2025-09-25 ENCOUNTER — Encounter: Admitting: Family Medicine
